# Patient Record
Sex: Male | Born: 1955
Health system: Southern US, Community
[De-identification: ages and names within clinical notes are randomized; demographics above are authoritative.]

## PROBLEM LIST (undated history)

## (undated) DIAGNOSIS — E785 Hyperlipidemia, unspecified: Secondary | ICD-10-CM

## (undated) DIAGNOSIS — E119 Type 2 diabetes mellitus without complications: Secondary | ICD-10-CM

## (undated) DIAGNOSIS — I10 Essential (primary) hypertension: Secondary | ICD-10-CM

## (undated) DIAGNOSIS — T7840XA Allergy, unspecified, initial encounter: Secondary | ICD-10-CM

## (undated) HISTORY — PX: TONSILLECTOMY AND ADENOIDECTOMY: SHX28

## (undated) HISTORY — PX: VASECTOMY: SHX75

## (undated) HISTORY — DX: Hyperlipidemia, unspecified: E78.5

## (undated) HISTORY — PX: TESTICLE SURGERY: SHX794

## (undated) HISTORY — PX: HERNIA REPAIR: SHX51

## (undated) HISTORY — DX: Allergy, unspecified, initial encounter: T78.40XA

## (undated) HISTORY — DX: Essential (primary) hypertension: I10

## (undated) HISTORY — DX: Type 2 diabetes mellitus without complications: E11.9

---

## 2001-05-18 ENCOUNTER — Encounter: Payer: Self-pay | Admitting: Family Medicine

## 2001-05-18 ENCOUNTER — Ambulatory Visit (HOSPITAL_COMMUNITY): Admission: RE | Admit: 2001-05-18 | Discharge: 2001-05-18 | Payer: Self-pay | Admitting: Family Medicine

## 2012-06-04 ENCOUNTER — Other Ambulatory Visit: Payer: Self-pay | Admitting: *Deleted

## 2012-06-04 MED ORDER — CANAGLIFLOZIN 300 MG PO TABS: 300.0000 mg | ORAL_TABLET | Freq: Every day | ORAL | Status: DC

## 2012-06-04 NOTE — Telephone Encounter (Signed)
Patient last seen on 09-07-11 for chronic health check. Last A1C on 12-21-11. Please advise. Thank you

## 2012-09-08 ENCOUNTER — Other Ambulatory Visit: Payer: Self-pay | Admitting: *Deleted

## 2012-09-08 MED ORDER — CANAGLIFLOZIN 300 MG PO TABS: 300.0000 mg | ORAL_TABLET | Freq: Every day | ORAL | Status: DC

## 2012-09-25 ENCOUNTER — Encounter: Payer: Self-pay | Admitting: *Deleted

## 2012-10-01 ENCOUNTER — Ambulatory Visit: Payer: Self-pay | Admitting: General Practice

## 2012-10-03 ENCOUNTER — Ambulatory Visit: Payer: Self-pay | Admitting: General Practice

## 2012-10-06 ENCOUNTER — Other Ambulatory Visit: Payer: Self-pay | Admitting: Family Medicine

## 2012-10-07 ENCOUNTER — Telehealth: Payer: Self-pay | Admitting: Family Medicine

## 2012-10-07 MED ORDER — SITAGLIPTIN PHOS-METFORMIN HCL 50-1000 MG PO TABS: 1.0000 | ORAL_TABLET | Freq: Two times a day (BID) | ORAL | Status: DC

## 2012-10-17 ENCOUNTER — Encounter: Payer: Self-pay | Admitting: General Practice

## 2012-10-17 ENCOUNTER — Telehealth: Payer: Self-pay | Admitting: General Practice

## 2012-10-17 ENCOUNTER — Ambulatory Visit (INDEPENDENT_AMBULATORY_CARE_PROVIDER_SITE_OTHER): Payer: BC Managed Care – PPO | Admitting: General Practice

## 2012-10-17 VITALS — BP 135/79 | HR 69 | Temp 97.9°F | Ht 72.0 in | Wt 225.5 lb

## 2012-10-17 DIAGNOSIS — I10 Essential (primary) hypertension: Secondary | ICD-10-CM

## 2012-10-17 DIAGNOSIS — E785 Hyperlipidemia, unspecified: Secondary | ICD-10-CM

## 2012-10-17 DIAGNOSIS — E119 Type 2 diabetes mellitus without complications: Secondary | ICD-10-CM

## 2012-10-17 LAB — POCT CBC
HCT, POC: 47.7 % (ref 43.5–53.7)
Lymph, poc: 2 (ref 0.6–3.4)
MCHC: 32.4 g/dL (ref 31.8–35.4)
MCV: 79.1 fL — AB (ref 80–97)
POC LYMPH PERCENT: 23.2 %L (ref 10–50)
RDW, POC: 15.4 %

## 2012-10-17 MED ORDER — HYDROCHLOROTHIAZIDE 12.5 MG PO CAPS: 12.5000 mg | ORAL_CAPSULE | Freq: Every day | ORAL | Status: DC

## 2012-10-17 MED ORDER — SIMVASTATIN 10 MG PO TABS: 10.0000 mg | ORAL_TABLET | Freq: Every day | ORAL | Status: DC

## 2012-10-17 MED ORDER — SITAGLIPTIN PHOS-METFORMIN HCL 50-1000 MG PO TABS: 1.0000 | ORAL_TABLET | Freq: Two times a day (BID) | ORAL | Status: DC

## 2012-10-17 MED ORDER — CANAGLIFLOZIN 300 MG PO TABS: 1.0000 | ORAL_TABLET | Freq: Every day | ORAL | Status: DC

## 2012-10-17 MED ORDER — RAMIPRIL 10 MG PO CAPS: 10.0000 mg | ORAL_CAPSULE | Freq: Every day | ORAL | Status: DC

## 2012-10-17 NOTE — Patient Instructions (Signed)

## 2012-10-17 NOTE — Progress Notes (Signed)
  Subjective:    Patient ID: Jason Rogers, male    DOB: November 09, 1955, 57 y.o.   MRN: [id number]  HPI Patient presents today for 3 month follow up. He has history of hypertension, diabetes, and hyperlipidemia. Denies checking blood sugars at home. He denies regular exercise. Reports trying to eat healthy.     Review of Systems  Constitutional: Negative for fever and chills.  HENT: Negative for neck pain and neck stiffness.   Respiratory: Negative for chest tightness and shortness of breath.   Cardiovascular: Negative for chest pain and palpitations.  Gastrointestinal: Negative for vomiting, abdominal pain, diarrhea and blood in stool.  Genitourinary: Negative for dysuria and difficulty urinating.  Neurological: Negative for dizziness, weakness and headaches.       Objective:   Physical Exam  Constitutional: He is oriented to person, place, and time. He appears well-developed and well-nourished.  HENT:  Head: Normocephalic and atraumatic.  Right Ear: External ear normal.  Left Ear: External ear normal.  Mouth/Throat: Oropharynx is clear and moist.  Eyes: Conjunctivae and EOM are normal. Pupils are equal, round, and reactive to light.  Neck: Normal range of motion. Neck supple.  Cardiovascular: Normal rate, regular rhythm and normal heart sounds.   Pulmonary/Chest: Effort normal and breath sounds normal. No respiratory distress. He exhibits no tenderness.  Abdominal: Soft. Bowel sounds are normal. He exhibits no distension. There is no tenderness.  Musculoskeletal: He exhibits no edema and no tenderness.  Neurological: He is alert and oriented to person, place, and time.  Skin: Skin is warm and dry.  Psychiatric: He has a normal mood and affect.          Assessment & Plan:  1. Diabetes - POCT glycosylated hemoglobin (Hb A1C) - Canagliflozin (INVOKANA) 300 MG TABS; Take 1 tablet (300 mg total) by mouth daily.  Dispense: 90 tablet; Refill: 1 - sitaGLIPtan-metformin (JANUMET)  50-1000 MG per tablet; Take 1 tablet by mouth 2 (two) times daily with a meal.  Dispense: 60 tablet; Refill: 3  2. Hyperlipemia - simvastatin (ZOCOR) 10 MG tablet; Take 1 tablet (10 mg total) by mouth at bedtime.  Dispense: 90 tablet; Refill: 1 - NMR, lipoprofile  3. Hypertension - hydrochlorothiazide (MICROZIDE) 12.5 MG capsule; Take 1 capsule (12.5 mg total) by mouth daily.  Dispense: 90 capsule; Refill: 1 - ramipril (ALTACE) 10 MG capsule; Take 1 capsule (10 mg total) by mouth daily.  Dispense: 90 capsule; Refill: 1 - POCT CBC - CMP14+EGFR -Continue all current medications Labs pending F/u in 3 months and sooner if symptoms develop Discussed exercise and diet  Patient verbalized understanding Coralie Keens, FNP-C

## 2012-10-19 LAB — CMP14+EGFR
ALT: 24 IU/L (ref 0–44)
AST: 20 IU/L (ref 0–40)
Albumin/Globulin Ratio: 2 (ref 1.1–2.5)
Alkaline Phosphatase: 47 IU/L (ref 39–117)
BUN/Creatinine Ratio: 23 — ABNORMAL HIGH (ref 9–20)
GFR calc Af Amer: 89 mL/min/{1.73_m2} (ref >59)
GFR calc non Af Amer: 77 mL/min/{1.73_m2} (ref >59)
Potassium: 4.6 mmol/L (ref 3.5–5.2)
Sodium: 139 mmol/L (ref 134–144)
Total Bilirubin: 1.3 mg/dL — ABNORMAL HIGH (ref 0.0–1.2)

## 2012-10-19 LAB — NMR, LIPOPROFILE
Cholesterol: 157 mg/dL (ref <200)
HDL Particle Number: 37 umol/L (ref >=30.5)
LP-IR Score: 81 — ABNORMAL HIGH (ref <=45)
Small LDL Particle Number: 746 nmol/L — ABNORMAL HIGH (ref <=527)

## 2012-10-24 ENCOUNTER — Telehealth: Payer: Self-pay | Admitting: General Practice

## 2012-10-24 NOTE — Telephone Encounter (Signed)
Notified patient of lab results. See last lab note

## 2012-10-24 NOTE — Telephone Encounter (Signed)
Duplicate message. 

## 2013-03-16 ENCOUNTER — Other Ambulatory Visit: Payer: Self-pay | Admitting: *Deleted

## 2013-03-16 DIAGNOSIS — E119 Type 2 diabetes mellitus without complications: Secondary | ICD-10-CM

## 2013-03-16 MED ORDER — SITAGLIPTIN PHOS-METFORMIN HCL 50-1000 MG PO TABS: 1.0000 | ORAL_TABLET | Freq: Two times a day (BID) | ORAL | Status: DC

## 2013-04-10 ENCOUNTER — Encounter: Payer: Self-pay | Admitting: General Practice

## 2013-04-10 ENCOUNTER — Ambulatory Visit (INDEPENDENT_AMBULATORY_CARE_PROVIDER_SITE_OTHER): Payer: BC Managed Care – PPO | Admitting: General Practice

## 2013-04-10 VITALS — BP 126/76 | HR 68 | Temp 97.5°F | Ht 72.0 in | Wt 231.0 lb

## 2013-04-10 DIAGNOSIS — E1169 Type 2 diabetes mellitus with other specified complication: Secondary | ICD-10-CM | POA: Insufficient documentation

## 2013-04-10 DIAGNOSIS — I10 Essential (primary) hypertension: Secondary | ICD-10-CM

## 2013-04-10 DIAGNOSIS — E785 Hyperlipidemia, unspecified: Secondary | ICD-10-CM

## 2013-04-10 DIAGNOSIS — E119 Type 2 diabetes mellitus without complications: Secondary | ICD-10-CM | POA: Insufficient documentation

## 2013-04-10 DIAGNOSIS — I152 Hypertension secondary to endocrine disorders: Secondary | ICD-10-CM | POA: Insufficient documentation

## 2013-04-10 DIAGNOSIS — E1159 Type 2 diabetes mellitus with other circulatory complications: Secondary | ICD-10-CM | POA: Insufficient documentation

## 2013-04-10 LAB — POCT GLYCOSYLATED HEMOGLOBIN (HGB A1C): Hemoglobin A1C: 7.2

## 2013-04-10 MED ORDER — HYDROCHLOROTHIAZIDE 12.5 MG PO CAPS: 12.5000 mg | ORAL_CAPSULE | Freq: Every day | ORAL | Status: DC

## 2013-04-10 MED ORDER — SIMVASTATIN 10 MG PO TABS: 10.0000 mg | ORAL_TABLET | Freq: Every day | ORAL | Status: DC

## 2013-04-10 MED ORDER — SITAGLIPTIN PHOS-METFORMIN HCL 50-1000 MG PO TABS: 1.0000 | ORAL_TABLET | Freq: Two times a day (BID) | ORAL | Status: DC

## 2013-04-10 MED ORDER — CANAGLIFLOZIN 300 MG PO TABS: 1.0000 | ORAL_TABLET | Freq: Every day | ORAL | Status: DC

## 2013-04-10 MED ORDER — RAMIPRIL 10 MG PO CAPS: 10.0000 mg | ORAL_CAPSULE | Freq: Every day | ORAL | Status: DC

## 2013-04-10 NOTE — Patient Instructions (Signed)
Leg Cramps  Leg cramps that occur during exercise can be caused by poor circulation or dehydration. However, muscle cramps that occur at rest or during the night are usually not due to any serious medical problem. Heat cramps may cause muscle spasms during hot weather.   CAUSES  There is no clear cause for muscle cramps. However, dehydration may be a factor for those who do not drink enough fluids and those who exercise in the heat. Imbalances in the level of sodium, potassium, calcium or magnesium in the muscle tissue may also be a factor. Some medications, such as water pills (diuretics), may cause loss of chemicals that the body needs (like sodium and potassium) and cause muscle cramps.  TREATMENT   · Make sure your diet has enough fluids and essential minerals for the muscle to work normally.  · Avoid strenuous exercise for several days if you have been having frequent leg cramps.  · Stretch and massage the cramped muscle for several minutes.  · Some medicines may be helpful in some patients with night cramps. Only take over-the-counter or prescription medicines as directed by your caregiver.  SEEK IMMEDIATE MEDICAL CARE IF:   · Your leg cramps become worse.  · Your foot becomes cold, numb, or blue.  Document Released: 03/22/2004 Document Revised: 05/07/2011 Document Reviewed: 03/09/2008  [employer]® Patient Information ©2014 [employer].

## 2013-04-10 NOTE — Progress Notes (Signed)
   Subjective:    Patient ID: Jason Rogers, male    DOB: 12/12/1955, 58 y.o.   MRN: [id number]  HPI Patient presents today for chronic health follow up. History of diabetes, hyperlipidemia, and hypertension. He reports taking medications as prescribed, but denies checking blood sugars or pressures. Reports trying to eat a healthy diet. Complaints of leg cramps periodically, but is relieved with eating foods higher in potassium. Denies regular exercise.     Review of Systems  Constitutional: Negative for fever and chills.  Respiratory: Negative for chest tightness and shortness of breath.   Cardiovascular: Negative for chest pain and palpitations.  All other systems reviewed and are negative.       Objective:   Physical Exam  Constitutional: He is oriented to person, place, and time. He appears well-developed and well-nourished.  HENT:  Head: Normocephalic and atraumatic.  Right Ear: External ear normal.  Left Ear: External ear normal.  Cardiovascular: Normal rate, regular rhythm and normal heart sounds.   Pulmonary/Chest: Effort normal and breath sounds normal. No respiratory distress. He exhibits no tenderness.  Abdominal: Soft. Bowel sounds are normal. He exhibits no distension. There is no tenderness.  Neurological: He is alert and oriented to person, place, and time.  Skin: Skin is warm and dry.  Psychiatric: He has a normal mood and affect.          Assessment & Plan:  1. Diabetes  - sitaGLIPtin-metformin (JANUMET) 50-1000 MG per tablet; Take 1 tablet by mouth 2 (two) times daily with a meal.  Dispense: 180 tablet; Refill: 1 - Canagliflozin (INVOKANA) 300 MG TABS; Take 1 tablet (300 mg total) by mouth daily.  Dispense: 90 tablet; Refill: 1 -HgbA1C  2. Hyperlipemia  - simvastatin (ZOCOR) 10 MG tablet; Take 1 tablet (10 mg total) by mouth at bedtime.  Dispense: 90 tablet; Refill: 1 -lipid panel  3. Hypertension  - ramipril (ALTACE) 10 MG capsule; Take 1 capsule  (10 mg total) by mouth daily.  Dispense: 90 capsule; Refill: 1 - hydrochlorothiazide (MICROZIDE) 12.5 MG capsule; Take 1 capsule (12.5 mg total) by mouth daily.  Dispense: 90 capsule; Refill: 1 - CMP14+EGFR Continue all current medications Labs pending F/u in 3 months Discussed benefits of regular exercise and healthy eating Patient verbalized understanding Erby Pian, FNP-C

## 2013-04-11 LAB — CMP14+EGFR
ALBUMIN: 4.5 g/dL (ref 3.5–5.5)
ALT: 23 IU/L (ref 0–44)
AST: 22 IU/L (ref 0–40)
Albumin/Globulin Ratio: 2.3 (ref 1.1–2.5)
Alkaline Phosphatase: 48 IU/L (ref 39–117)
BILIRUBIN TOTAL: 0.9 mg/dL (ref 0.0–1.2)
BUN/Creatinine Ratio: 21 — ABNORMAL HIGH (ref 9–20)
BUN: 23 mg/dL (ref 6–24)
CHLORIDE: 100 mmol/L (ref 97–108)
CO2: 24 mmol/L (ref 18–29)
CREATININE: 1.07 mg/dL (ref 0.76–1.27)
Calcium: 9.2 mg/dL (ref 8.7–10.2)
GFR, EST AFRICAN AMERICAN: 87 mL/min/{1.73_m2} (ref >59)
GFR, EST NON AFRICAN AMERICAN: 76 mL/min/{1.73_m2} (ref >59)
GLOBULIN, TOTAL: 2 g/dL (ref 1.5–4.5)
GLUCOSE: 135 mg/dL — AB (ref 65–99)
Potassium: 4.7 mmol/L (ref 3.5–5.2)
Sodium: 140 mmol/L (ref 134–144)
TOTAL PROTEIN: 6.5 g/dL (ref 6.0–8.5)

## 2013-04-11 LAB — LIPID PANEL
CHOLESTEROL TOTAL: 152 mg/dL (ref 100–199)
Chol/HDL Ratio: 3.4 ratio units (ref 0.0–5.0)
HDL: 45 mg/dL (ref >39)
LDL CALC: 76 mg/dL (ref 0–99)
Triglycerides: 154 mg/dL — ABNORMAL HIGH (ref 0–149)
VLDL CHOLESTEROL CAL: 31 mg/dL (ref 5–40)

## 2013-04-18 ENCOUNTER — Other Ambulatory Visit: Payer: Self-pay | Admitting: General Practice

## 2013-04-20 ENCOUNTER — Telehealth: Payer: Self-pay | Admitting: Family Medicine

## 2013-04-20 NOTE — Telephone Encounter (Signed)
Pt wants copy of lab results mailed to him.  Pt aware of results.  rs

## 2013-04-20 NOTE — Telephone Encounter (Signed)
Message copied by Waverly Ferrari on Mon Apr 20, 2013 10:01 AM ------      Message from: Erby Pian      Created: Sat Apr 18, 2013 11:52 AM       Please inform that labs are within range. HgbA1c is 7.2 and triglycerides are elevated. Continue medications as directed. Also continue working on healthy eating and some form of regular exercise. Will recheck in 3 months. ------

## 2013-05-19 ENCOUNTER — Encounter: Payer: Self-pay | Admitting: *Deleted

## 2013-06-03 ENCOUNTER — Ambulatory Visit: Payer: BC Managed Care – PPO | Admitting: General Practice

## 2013-06-04 ENCOUNTER — Encounter: Payer: Self-pay | Admitting: *Deleted

## 2013-06-05 ENCOUNTER — Ambulatory Visit: Payer: BC Managed Care – PPO | Admitting: General Practice

## 2013-06-24 ENCOUNTER — Ambulatory Visit: Payer: BC Managed Care – PPO | Admitting: General Practice

## 2013-07-03 ENCOUNTER — Ambulatory Visit: Payer: BC Managed Care – PPO | Admitting: General Practice

## 2013-09-15 ENCOUNTER — Encounter: Payer: Self-pay | Admitting: *Deleted

## 2013-10-03 ENCOUNTER — Other Ambulatory Visit: Payer: Self-pay | Admitting: General Practice

## 2013-10-05 NOTE — Telephone Encounter (Signed)
Last seen and last glucose 04/10/13 Mae  This med not on [facility] med list

## 2013-10-05 NOTE — Telephone Encounter (Signed)
no more refills without being seen  

## 2013-10-23 ENCOUNTER — Encounter: Payer: Self-pay | Admitting: Family

## 2013-10-23 ENCOUNTER — Ambulatory Visit (INDEPENDENT_AMBULATORY_CARE_PROVIDER_SITE_OTHER): Payer: BC Managed Care – PPO | Admitting: Family

## 2013-10-23 VITALS — BP 138/81 | HR 64 | Temp 97.1°F | Ht 72.0 in | Wt 235.0 lb

## 2013-10-23 DIAGNOSIS — I10 Essential (primary) hypertension: Secondary | ICD-10-CM

## 2013-10-23 DIAGNOSIS — Z1321 Encounter for screening for nutritional disorder: Secondary | ICD-10-CM

## 2013-10-23 DIAGNOSIS — Z125 Encounter for screening for malignant neoplasm of prostate: Secondary | ICD-10-CM

## 2013-10-23 DIAGNOSIS — E119 Type 2 diabetes mellitus without complications: Secondary | ICD-10-CM

## 2013-10-23 DIAGNOSIS — R079 Chest pain, unspecified: Secondary | ICD-10-CM

## 2013-10-23 DIAGNOSIS — E785 Hyperlipidemia, unspecified: Secondary | ICD-10-CM

## 2013-10-23 LAB — POCT UA - MICROALBUMIN: Microalbumin Ur, POC: NEGATIVE mg/L

## 2013-10-23 LAB — POCT GLYCOSYLATED HEMOGLOBIN (HGB A1C): Hemoglobin A1C: 6.3

## 2013-10-23 MED ORDER — SIMVASTATIN 10 MG PO TABS: 10.0000 mg | ORAL_TABLET | Freq: Every day | ORAL | Status: DC

## 2013-10-23 MED ORDER — CANAGLIFLOZIN 300 MG PO TABS: 1.0000 | ORAL_TABLET | Freq: Every day | ORAL | Status: DC

## 2013-10-23 MED ORDER — SITAGLIPTIN PHOS-METFORMIN HCL 50-1000 MG PO TABS
ORAL_TABLET | ORAL | Status: DC
Start: 2013-10-23 — End: 2014-04-30

## 2013-10-23 MED ORDER — HYDROCHLOROTHIAZIDE 12.5 MG PO CAPS: 12.5000 mg | ORAL_CAPSULE | Freq: Every day | ORAL | Status: DC

## 2013-10-23 MED ORDER — RAMIPRIL 10 MG PO CAPS: 10.0000 mg | ORAL_CAPSULE | Freq: Every day | ORAL | Status: DC

## 2013-10-23 NOTE — Patient Instructions (Signed)

## 2013-10-23 NOTE — Progress Notes (Signed)
 Subjective:    Patient ID: Jason Rogers, male    DOB: 10/18/1955, 58 y.o.   MRN: [id number]  Diabetes He presents for his follow-up diabetic visit. He has type 2 diabetes mellitus. His disease course has been stable. Hypoglycemia symptoms include headaches. Pertinent negatives for hypoglycemia include no confusion or dizziness. Pertinent negatives for diabetes include no blurred vision, no foot paresthesias, no foot ulcerations and no visual change. Pertinent negatives for hypoglycemia complications include no blackouts. Symptoms are stable. Pertinent negatives for diabetic complications include no CVA, heart disease, nephropathy or peripheral neuropathy. Risk factors for coronary artery disease include diabetes mellitus, dyslipidemia, hypertension, male sex and family history. Current diabetic treatment includes oral agent (triple therapy). He is compliant with treatment all of the time. He is following a generally healthy diet. An ACE inhibitor/angiotensin II receptor blocker is being taken. Eye exam is not current.  Hypertension This is a chronic problem. The current episode started more than 1 year ago. The problem has been resolved since onset. The problem is controlled. Associated symptoms include headaches. Pertinent negatives include no blurred vision, palpitations, peripheral edema or shortness of breath. Risk factors for coronary artery disease include diabetes mellitus, dyslipidemia, family history and male gender. Past treatments include ACE inhibitors and diuretics. The current treatment provides moderate improvement. There is no history of kidney disease, CAD/MI, CVA, heart failure or a thyroid problem.  Hyperlipidemia This is a chronic problem. The current episode started more than 1 year ago. The problem is controlled. Recent lipid tests were reviewed and are normal. Exacerbating diseases include diabetes. He has no history of hypothyroidism. Pertinent negatives include no leg pain,  myalgias or shortness of breath. Current antihyperlipidemic treatment includes statins. The current treatment provides moderate improvement of lipids. Risk factors for coronary artery disease include diabetes mellitus, dyslipidemia, family history, hypertension, male sex and a sedentary lifestyle.   *Pt states he is having intermittent chest pain that comes and goes. Pt states this has been going on for years. He played footfall in school and states he took a lot of hard hits in his chest. Pt states that usually the pain is after lifting something heavy. However, he was on vacation this week and states he "did nothing" but felt like he was having chest pain more than usual.     Review of Systems  Constitutional: Negative.   Eyes: Negative for blurred vision.  Respiratory: Negative.  Negative for shortness of breath.   Cardiovascular: Negative.  Negative for palpitations.  Gastrointestinal: Negative.   Endocrine: Negative.   Genitourinary: Negative.   Musculoskeletal: Negative.  Negative for myalgias.  Neurological: Positive for headaches. Negative for dizziness.  Hematological: Negative.   Psychiatric/Behavioral: Negative.  Negative for confusion.  All other systems reviewed and are negative.      Objective:   Physical Exam  Vitals reviewed. Constitutional: He is oriented to person, place, and time. He appears well-developed and well-nourished. No distress.  HENT:  Head: Normocephalic.  Right Ear: External ear normal.  Left Ear: External ear normal.  Nose: Nose normal.  Mouth/Throat: Oropharynx is clear and moist.  Eyes: Pupils are equal, round, and reactive to light. Right eye exhibits no discharge. Left eye exhibits no discharge.  Neck: Normal range of motion. Neck supple. No thyromegaly present.  Cardiovascular: Normal rate, regular rhythm, normal heart sounds and intact distal pulses.   No murmur heard. Pulmonary/Chest: Effort normal and breath sounds normal. No respiratory  distress. He has no wheezes.  Abdominal: Soft.   Bowel sounds are normal. He exhibits no distension. There is no tenderness.  Musculoskeletal: Normal range of motion. He exhibits no edema and no tenderness.  Neurological: He is alert and oriented to person, place, and time. He has normal reflexes. No cranial nerve deficit.  Skin: Skin is warm and dry. No rash noted. No erythema.  Psychiatric: He has a normal mood and affect. His behavior is normal. Judgment and thought content normal.   *See Diabetic foot note  EGK-WNL  BP 138/81  Pulse 64  Temp(Src) 97.1 F (36.2 C) (Oral)  Ht 6' (1.829 m)  Wt 235 lb (106.595 kg)  BMI 31.86 kg/m2     Assessment & Plan:  1. Essential hypertension - CMP14+EGFR - hydrochlorothiazide (MICROZIDE) 12.5 MG capsule; Take 1 capsule (12.5 mg total) by mouth daily.  Dispense: 90 capsule; Refill: 1 - ramipril (ALTACE) 10 MG capsule; Take 1 capsule (10 mg total) by mouth daily.  Dispense: 90 capsule; Refill: 1  2. Type 2 diabetes mellitus without complication - POCT glycosylated hemoglobin (Hb A1C) - CMP14+EGFR - POCT UA - Microalbumin - Canagliflozin (INVOKANA) 300 MG TABS; Take 1 tablet (300 mg total) by mouth daily.  Dispense: 90 tablet; Refill: 2  3. HLD (hyperlipidemia) - CMP14+EGFR - Lipid panel - simvastatin (ZOCOR) 10 MG tablet; Take 1 tablet (10 mg total) by mouth at bedtime.  Dispense: 90 tablet; Refill: 1  4. Hyperlipemia  5. Encounter for vitamin deficiency screening - Vit D  25 hydroxy (rtn osteoporosis monitoring)  6. Prostate cancer screening - PSA, total and free   Continue all meds Labs pending Health Maintenance reviewed-hemoccult cards given to patient with directions Diet and exercise encouraged RTO 6 months  Christy Hawks, FNP   

## 2013-10-24 LAB — CMP14+EGFR
A/G RATIO: 1.9 (ref 1.1–2.5)
ALT: 26 IU/L (ref 0–44)
AST: 24 IU/L (ref 0–40)
Albumin: 4.4 g/dL (ref 3.5–5.5)
Alkaline Phosphatase: 51 IU/L (ref 39–117)
BILIRUBIN TOTAL: 0.7 mg/dL (ref 0.0–1.2)
BUN/Creatinine Ratio: 21 — ABNORMAL HIGH (ref 9–20)
BUN: 22 mg/dL (ref 6–24)
CO2: 24 mmol/L (ref 18–29)
Calcium: 9.3 mg/dL (ref 8.7–10.2)
Chloride: 100 mmol/L (ref 97–108)
Creatinine, Ser: 1.05 mg/dL (ref 0.76–1.27)
GFR, EST AFRICAN AMERICAN: 89 mL/min/{1.73_m2} (ref >59)
GFR, EST NON AFRICAN AMERICAN: 77 mL/min/{1.73_m2} (ref >59)
GLOBULIN, TOTAL: 2.3 g/dL (ref 1.5–4.5)
Glucose: 137 mg/dL — ABNORMAL HIGH (ref 65–99)
Potassium: 4.7 mmol/L (ref 3.5–5.2)
SODIUM: 139 mmol/L (ref 134–144)
TOTAL PROTEIN: 6.7 g/dL (ref 6.0–8.5)

## 2013-10-24 LAB — LIPID PANEL
CHOL/HDL RATIO: 3.3 ratio (ref 0.0–5.0)
Cholesterol, Total: 152 mg/dL (ref 100–199)
HDL: 46 mg/dL (ref >39)
LDL Calculated: 79 mg/dL (ref 0–99)
Triglycerides: 134 mg/dL (ref 0–149)
VLDL Cholesterol Cal: 27 mg/dL (ref 5–40)

## 2013-10-24 LAB — PSA, TOTAL AND FREE
PSA, Free Pct: 18.1 %
PSA, Free: 0.38 ng/mL
PSA: 2.1 ng/mL (ref 0.0–4.0)

## 2013-10-24 LAB — VITAMIN D 25 HYDROXY (VIT D DEFICIENCY, FRACTURES): Vit D, 25-Hydroxy: 32.4 ng/mL (ref 30.0–100.0)

## 2013-10-26 ENCOUNTER — Telehealth: Payer: Self-pay | Admitting: Family Medicine

## 2013-10-26 NOTE — Telephone Encounter (Signed)
Lab results given to wife and would like copy mailed to them.  Wife wants to know if lab her lab results were back and also wants her lab results mailed to her..  Call her at [phone]-home

## 2013-10-26 NOTE — Telephone Encounter (Signed)
Message copied by Waverly Ferrari on Mon Oct 26, 2013 10:51 AM ------      Message from: Lenna Gilford, [place] A      Created: Mon Oct 26, 2013 10:41 AM       HgbA1C- WNL      Microablumin negative      Kidney and liver function stable      Cholesterol levels WNL      PSA levels WNL      Vit D levels low side of normal- Would benefit from Vit D OTC ------

## 2013-10-26 NOTE — Telephone Encounter (Signed)
Copy mailed.

## 2014-03-05 ENCOUNTER — Other Ambulatory Visit: Payer: Self-pay | Admitting: Family

## 2014-03-09 ENCOUNTER — Encounter: Payer: Self-pay | Admitting: *Deleted

## 2014-04-30 ENCOUNTER — Ambulatory Visit (INDEPENDENT_AMBULATORY_CARE_PROVIDER_SITE_OTHER): Payer: BLUE CROSS/BLUE SHIELD | Admitting: Family

## 2014-04-30 ENCOUNTER — Encounter: Payer: Self-pay | Admitting: Family

## 2014-04-30 VITALS — BP 128/81 | HR 69 | Temp 97.1°F | Ht 72.0 in | Wt 234.0 lb

## 2014-04-30 DIAGNOSIS — E785 Hyperlipidemia, unspecified: Secondary | ICD-10-CM

## 2014-04-30 DIAGNOSIS — I10 Essential (primary) hypertension: Secondary | ICD-10-CM

## 2014-04-30 DIAGNOSIS — E119 Type 2 diabetes mellitus without complications: Secondary | ICD-10-CM | POA: Diagnosis not present

## 2014-04-30 DIAGNOSIS — E559 Vitamin D deficiency, unspecified: Secondary | ICD-10-CM | POA: Diagnosis not present

## 2014-04-30 DIAGNOSIS — Z23 Encounter for immunization: Secondary | ICD-10-CM | POA: Diagnosis not present

## 2014-04-30 LAB — POCT GLYCOSYLATED HEMOGLOBIN (HGB A1C): HEMOGLOBIN A1C: 7.2

## 2014-04-30 MED ORDER — HYDROCHLOROTHIAZIDE 12.5 MG PO CAPS: 12.5000 mg | ORAL_CAPSULE | Freq: Every day | ORAL | Status: DC

## 2014-04-30 MED ORDER — RAMIPRIL 10 MG PO CAPS: 10.0000 mg | ORAL_CAPSULE | Freq: Every day | ORAL | Status: DC

## 2014-04-30 MED ORDER — CANAGLIFLOZIN 300 MG PO TABS: 300.0000 mg | ORAL_TABLET | Freq: Every day | ORAL | Status: DC

## 2014-04-30 MED ORDER — SIMVASTATIN 10 MG PO TABS: 10.0000 mg | ORAL_TABLET | Freq: Every day | ORAL | Status: DC

## 2014-04-30 MED ORDER — SITAGLIPTIN PHOS-METFORMIN HCL 50-1000 MG PO TABS: ORAL_TABLET | ORAL | Status: DC

## 2014-04-30 NOTE — Addendum Note (Signed)
Addended by: Rolena Infante on: 04/30/2014 12:14 PM   Modules accepted: Orders

## 2014-04-30 NOTE — Patient Instructions (Signed)

## 2014-04-30 NOTE — Progress Notes (Signed)
Subjective:    Patient ID: Jason Rogers, male    DOB: 1955-08-08, 59 y.o.   MRN: [id number]  Diabetes He presents for his follow-up diabetic visit. He has type 2 diabetes mellitus. His disease course has been stable. Hypoglycemia symptoms include headaches. Pertinent negatives for hypoglycemia include no confusion. Pertinent negatives for diabetes include no blurred vision, no foot paresthesias, no foot ulcerations and no visual change. Pertinent negatives for hypoglycemia complications include no blackouts and no hospitalization. Symptoms are stable. Pertinent negatives for diabetic complications include no CVA, heart disease, nephropathy or peripheral neuropathy. Risk factors for coronary artery disease include diabetes mellitus, dyslipidemia, family history, hypertension and male sex. Current diabetic treatment includes diet and oral agent (dual therapy). He is compliant with treatment all of the time. His weight is stable. He is following a generally healthy diet. (Pt does not take blood glucose ) An ACE inhibitor/angiotensin II receptor blocker is being taken. Eye exam is not current.  Hypertension This is a chronic problem. The current episode started more than 1 year ago. The problem has been resolved since onset. The problem is controlled. Associated symptoms include headaches. Pertinent negatives include no blurred vision, palpitations, peripheral edema or shortness of breath. Risk factors for coronary artery disease include diabetes mellitus, dyslipidemia, family history, male gender and sedentary lifestyle. Past treatments include ACE inhibitors and diuretics. There is no history of kidney disease, CAD/MI, CVA, heart failure or a thyroid problem. There is no history of sleep apnea.  Hyperlipidemia This is a chronic problem. The current episode started more than 1 year ago. The problem is controlled. Recent lipid tests were reviewed and are normal. Exacerbating diseases include diabetes. He  has no history of hypothyroidism. Pertinent negatives include no leg pain, myalgias or shortness of breath. Current antihyperlipidemic treatment includes statins. The current treatment provides significant improvement of lipids. Risk factors for coronary artery disease include diabetes mellitus, dyslipidemia, family history, hypertension, male sex, post-menopausal and a sedentary lifestyle.      Review of Systems  Constitutional: Negative.   Eyes: Negative for blurred vision.  Respiratory: Negative.  Negative for shortness of breath.   Cardiovascular: Negative.  Negative for palpitations.  Gastrointestinal: Negative.   Endocrine: Negative.   Genitourinary: Negative.   Musculoskeletal: Negative.  Negative for myalgias.  Neurological: Positive for headaches.  Hematological: Negative.   Psychiatric/Behavioral: Negative.  Negative for confusion.  All other systems reviewed and are negative.      Objective:   Physical Exam  Constitutional: He is oriented to person, place, and time. He appears well-developed and well-nourished. No distress.  HENT:  Head: Normocephalic.  Right Ear: External ear normal.  Left Ear: External ear normal.  Nose: Nose normal.  Mouth/Throat: Oropharynx is clear and moist.  Eyes: Pupils are equal, round, and reactive to light. Right eye exhibits no discharge. Left eye exhibits no discharge.  Neck: Normal range of motion. Neck supple. No thyromegaly present.  Cardiovascular: Normal rate, regular rhythm, normal heart sounds and intact distal pulses.   No murmur heard. Pulmonary/Chest: Effort normal and breath sounds normal. No respiratory distress. He has no wheezes.  Abdominal: Soft. Bowel sounds are normal. He exhibits no distension. There is no tenderness.  Musculoskeletal: Normal range of motion. He exhibits no edema or tenderness.  Neurological: He is alert and oriented to person, place, and time. He has normal reflexes. No cranial nerve deficit.  Skin:  Skin is warm and dry. No rash noted. No erythema.  Psychiatric: He has  a normal mood and affect. His behavior is normal. Judgment and thought content normal.  Vitals reviewed.     BP 128/81 mmHg  Pulse 69  Temp(Src) 97.1 F (36.2 C) (Oral)  Ht 6' (1.829 m)  Wt 234 lb (106.142 kg)  BMI 31.73 kg/m2     Assessment & Plan:  1. Essential hypertension - CMP14+EGFR - ramipril (ALTACE) 10 MG capsule; Take 1 capsule (10 mg total) by mouth daily.  Dispense: 90 capsule; Refill: 4 - hydrochlorothiazide (MICROZIDE) 12.5 MG capsule; Take 1 capsule (12.5 mg total) by mouth daily.  Dispense: 90 capsule; Refill: 3  2. HLD (hyperlipidemia) - CMP14+EGFR - Lipid panel - simvastatin (ZOCOR) 10 MG tablet; Take 1 tablet (10 mg total) by mouth at bedtime.  Dispense: 90 tablet; Refill: 4  3. Type 2 diabetes mellitus without complication - POCT glycosylated hemoglobin (Hb A1C) - CMP14+EGFR - sitaGLIPtin-metformin (JANUMET) 50-1000 MG per tablet; TAKE ONE TABLET BY MOUTH TWICE DAILY WITH A MEAL  Dispense: 180 tablet; Refill: 4 - canagliflozin (INVOKANA) 300 MG TABS tablet; Take 300 mg by mouth daily.  Dispense: 90 tablet; Refill: 4  4. Vitamin D deficiency disease - Vit D  25 hydroxy (rtn osteoporosis monitoring)   Continue all meds Labs pending Health Maintenance reviewed- Flu and pneumonia vaccine given Diet and exercise encouraged RTO 6 months  Christy Hawks, FNP   

## 2014-05-01 LAB — CMP14+EGFR
ALK PHOS: 53 IU/L (ref 39–117)
ALT: 19 IU/L (ref 0–44)
AST: 21 IU/L (ref 0–40)
Albumin/Globulin Ratio: 1.8 (ref 1.1–2.5)
Albumin: 4.4 g/dL (ref 3.6–4.8)
BILIRUBIN TOTAL: 0.9 mg/dL (ref 0.0–1.2)
BUN / CREAT RATIO: 22 (ref 10–22)
BUN: 22 mg/dL (ref 8–27)
CO2: 23 mmol/L (ref 18–29)
CREATININE: 1.01 mg/dL (ref 0.76–1.27)
Calcium: 9.4 mg/dL (ref 8.6–10.2)
Chloride: 102 mmol/L (ref 97–108)
GFR calc Af Amer: 93 mL/min/{1.73_m2} (ref >59)
GFR calc non Af Amer: 80 mL/min/{1.73_m2} (ref >59)
GLOBULIN, TOTAL: 2.4 g/dL (ref 1.5–4.5)
GLUCOSE: 112 mg/dL — AB (ref 65–99)
Potassium: 4.8 mmol/L (ref 3.5–5.2)
Sodium: 140 mmol/L (ref 134–144)
Total Protein: 6.8 g/dL (ref 6.0–8.5)

## 2014-05-01 LAB — LIPID PANEL
CHOL/HDL RATIO: 3.2 ratio (ref 0.0–5.0)
CHOLESTEROL TOTAL: 154 mg/dL (ref 100–199)
HDL: 48 mg/dL (ref >39)
LDL CALC: 83 mg/dL (ref 0–99)
Triglycerides: 117 mg/dL (ref 0–149)
VLDL CHOLESTEROL CAL: 23 mg/dL (ref 5–40)

## 2014-05-01 LAB — VITAMIN D 25 HYDROXY (VIT D DEFICIENCY, FRACTURES): Vit D, 25-Hydroxy: 32.3 ng/mL (ref 30.0–100.0)

## 2014-05-03 ENCOUNTER — Encounter: Payer: Self-pay | Admitting: Family

## 2014-05-05 ENCOUNTER — Telehealth: Payer: Self-pay | Admitting: Family

## 2014-05-05 NOTE — Telephone Encounter (Signed)
Called pt with lab results Verbalizes understanding Copy of labs mailed to pt

## 2014-06-28 ENCOUNTER — Other Ambulatory Visit: Payer: Self-pay | Admitting: Orthopaedic Surgery

## 2014-06-28 DIAGNOSIS — M25561 Pain in right knee: Secondary | ICD-10-CM

## 2014-07-01 ENCOUNTER — Ambulatory Visit
Admission: RE | Admit: 2014-07-01 | Discharge: 2014-07-01 | Disposition: A | Discharge status: Home / Self Care | Payer: BLUE CROSS/BLUE SHIELD | Source: Ambulatory Visit | Attending: Orthopaedic Surgery | Admitting: Orthopaedic Surgery

## 2014-07-01 DIAGNOSIS — M25561 Pain in right knee: Secondary | ICD-10-CM

## 2014-09-13 ENCOUNTER — Other Ambulatory Visit: Payer: Self-pay | Admitting: Orthopaedic Surgery

## 2014-09-13 DIAGNOSIS — M5410 Radiculopathy, site unspecified: Secondary | ICD-10-CM

## 2014-09-19 ENCOUNTER — Ambulatory Visit
Admission: RE | Admit: 2014-09-19 | Discharge: 2014-09-19 | Disposition: A | Discharge status: Home / Self Care | Payer: BLUE CROSS/BLUE SHIELD | Source: Ambulatory Visit | Attending: Orthopaedic Surgery | Admitting: Orthopaedic Surgery

## 2014-09-19 DIAGNOSIS — M5410 Radiculopathy, site unspecified: Secondary | ICD-10-CM

## 2014-10-27 ENCOUNTER — Other Ambulatory Visit: Payer: Self-pay | Admitting: *Deleted

## 2014-10-27 DIAGNOSIS — E119 Type 2 diabetes mellitus without complications: Secondary | ICD-10-CM

## 2014-10-27 MED ORDER — SITAGLIPTIN PHOS-METFORMIN HCL 50-1000 MG PO TABS: ORAL_TABLET | ORAL | Status: DC

## 2014-10-29 ENCOUNTER — Encounter (INDEPENDENT_AMBULATORY_CARE_PROVIDER_SITE_OTHER): Payer: Self-pay

## 2014-10-29 ENCOUNTER — Encounter: Payer: Self-pay | Admitting: Family

## 2014-10-29 ENCOUNTER — Ambulatory Visit (INDEPENDENT_AMBULATORY_CARE_PROVIDER_SITE_OTHER): Payer: BLUE CROSS/BLUE SHIELD | Admitting: Family

## 2014-10-29 VITALS — BP 140/81 | HR 70 | Temp 97.7°F | Ht 72.0 in | Wt 235.2 lb

## 2014-10-29 DIAGNOSIS — E119 Type 2 diabetes mellitus without complications: Secondary | ICD-10-CM | POA: Diagnosis not present

## 2014-10-29 DIAGNOSIS — M2550 Pain in unspecified joint: Secondary | ICD-10-CM | POA: Diagnosis not present

## 2014-10-29 DIAGNOSIS — I10 Essential (primary) hypertension: Secondary | ICD-10-CM

## 2014-10-29 DIAGNOSIS — E785 Hyperlipidemia, unspecified: Secondary | ICD-10-CM

## 2014-10-29 LAB — POCT UA - MICROALBUMIN: Microalbumin Ur, POC: 20 mg/L

## 2014-10-29 LAB — POCT GLYCOSYLATED HEMOGLOBIN (HGB A1C): Hemoglobin A1C: 6.9

## 2014-10-29 MED ORDER — RAMIPRIL 10 MG PO CAPS: 10.0000 mg | ORAL_CAPSULE | Freq: Every day | ORAL | Status: DC

## 2014-10-29 MED ORDER — CANAGLIFLOZIN 300 MG PO TABS: 300.0000 mg | ORAL_TABLET | Freq: Every day | ORAL | Status: DC

## 2014-10-29 MED ORDER — HYDROCHLOROTHIAZIDE 12.5 MG PO CAPS: 12.5000 mg | ORAL_CAPSULE | Freq: Every day | ORAL | Status: DC

## 2014-10-29 MED ORDER — SITAGLIPTIN PHOS-METFORMIN HCL 50-1000 MG PO TABS: ORAL_TABLET | ORAL | Status: DC

## 2014-10-29 MED ORDER — SIMVASTATIN 10 MG PO TABS: 10.0000 mg | ORAL_TABLET | Freq: Every day | ORAL | Status: DC

## 2014-10-29 NOTE — Addendum Note (Signed)
Addended by: Earlene Plater on: 10/29/2014 09:18 AM   Modules accepted: Orders

## 2014-10-29 NOTE — Patient Instructions (Signed)

## 2014-10-29 NOTE — Progress Notes (Signed)
Subjective:    Patient ID: Jason Rogers, male    DOB: [birth year], 59 y.o.   MRN: [id number]  Pt presents to the office today for chronic follow up. Pt has had right surgery on right knee and has had nerve conduction test and has been told he had a "pinch nerve in his right knee". Pt states he was unable to flex his foot and is following up with Dr, Ernestina Patches and Dr. Lorin Mercy, [place], on Friday.    Diabetes He presents for his follow-up diabetic visit. He has type 2 diabetes mellitus. His disease course has been stable. Hypoglycemia symptoms include headaches. Pertinent negatives for hypoglycemia include no confusion. Pertinent negatives for diabetes include no blurred vision, no foot paresthesias, no foot ulcerations and no visual change. Pertinent negatives for hypoglycemia complications include no blackouts and no hospitalization. Symptoms are stable. Pertinent negatives for diabetic complications include no CVA, heart disease, nephropathy or peripheral neuropathy. Risk factors for coronary artery disease include diabetes mellitus, dyslipidemia, family history, hypertension and male sex. Current diabetic treatment includes diet and oral agent (dual therapy). He is compliant with treatment all of the time. His weight is stable. He is following a generally healthy diet. (Pt does not take blood glucose ) An ACE inhibitor/angiotensin II receptor blocker is being taken. Eye exam is not current.  Hypertension This is a chronic problem. The current episode started more than 1 year ago. The problem has been resolved since onset. The problem is controlled. Associated symptoms include headaches. Pertinent negatives include no blurred vision, palpitations, peripheral edema or shortness of breath. Risk factors for coronary artery disease include diabetes mellitus, dyslipidemia, family history, male gender and sedentary lifestyle. Past treatments include ACE inhibitors and diuretics. There is no history of kidney  disease, CAD/MI, CVA, heart failure or a thyroid problem. There is no history of sleep apnea.  Hyperlipidemia This is a chronic problem. The current episode started more than 1 year ago. The problem is controlled. Recent lipid tests were reviewed and are normal. Exacerbating diseases include diabetes. He has no history of hypothyroidism. Pertinent negatives include no leg pain, myalgias or shortness of breath. Current antihyperlipidemic treatment includes statins. The current treatment provides significant improvement of lipids. Risk factors for coronary artery disease include diabetes mellitus, dyslipidemia, family history, hypertension, male sex, post-menopausal and a sedentary lifestyle.      Review of Systems  Constitutional: Negative.   Eyes: Negative for blurred vision.  Respiratory: Negative.  Negative for shortness of breath.   Cardiovascular: Negative.  Negative for palpitations.  Gastrointestinal: Negative.   Endocrine: Negative.   Genitourinary: Negative.   Musculoskeletal: Negative.  Negative for myalgias.  Neurological: Positive for headaches.  Hematological: Negative.   Psychiatric/Behavioral: Negative.  Negative for confusion.  All other systems reviewed and are negative.      Objective:   Physical Exam  Constitutional: He is oriented to person, place, and time. He appears well-developed and well-nourished. No distress.  HENT:  Head: Normocephalic.  Right Ear: External ear normal.  Left Ear: External ear normal.  Mouth/Throat: Oropharynx is clear and moist.  Eyes: Pupils are equal, round, and reactive to light. Right eye exhibits no discharge. Left eye exhibits no discharge.  Neck: Normal range of motion. Neck supple. No thyromegaly present.  Cardiovascular: Normal rate, regular rhythm, normal heart sounds and intact distal pulses.   No murmur heard. Pulmonary/Chest: Effort normal and breath sounds normal. No respiratory distress. He has no wheezes.  Abdominal:  Soft. Bowel sounds  are normal. He exhibits no distension. There is no tenderness.  Musculoskeletal: Normal range of motion. He exhibits no edema or tenderness.  Neurological: He is alert and oriented to person, place, and time. He has normal reflexes. No cranial nerve deficit.  Skin: Skin is warm and dry. No rash noted. No erythema.  Psychiatric: He has a normal mood and affect. His behavior is normal. Judgment and thought content normal.  Vitals reviewed.   BP 140/81 mmHg  Pulse 70  Temp(Src) 97.7 F (36.5 C) (Oral)  Ht 6' (1.829 m)  Wt 235 lb 3.2 oz (106.686 kg)  BMI 31.89 kg/m2       Assessment & Plan:  1. HLD (hyperlipidemia) - CMP14+EGFR - Lipid panel - simvastatin (ZOCOR) 10 MG tablet; Take 1 tablet (10 mg total) by mouth at bedtime.  Dispense: 90 tablet; Refill: 4  2. Type 2 diabetes mellitus without complication - POCT glycosylated hemoglobin (Hb A1C) - POCT UA - Microalbumin - CMP14+EGFR - sitaGLIPtin-metformin (JANUMET) 50-1000 MG per tablet; TAKE ONE TABLET BY MOUTH TWICE DAILY WITH A MEAL  Dispense: 180 tablet; Refill: 3 - canagliflozin (INVOKANA) 300 MG TABS tablet; Take 300 mg by mouth daily.  Dispense: 90 tablet; Refill: 4  3. Essential hypertension - CMP14+EGFR - ramipril (ALTACE) 10 MG capsule; Take 1 capsule (10 mg total) by mouth daily.  Dispense: 90 capsule; Refill: 4 - hydrochlorothiazide (MICROZIDE) 12.5 MG capsule; Take 1 capsule (12.5 mg total) by mouth daily.  Dispense: 90 capsule; Refill: 3  4. Joint pain - Arthritis Panel   Continue all meds Labs pending Health Maintenance reviewed Diet and exercise encouraged RTO 6 months  Evelina Dun, FNP

## 2014-10-30 LAB — ARTHRITIS PANEL
Basophils Absolute: 0.1 10*3/uL (ref 0.0–0.2)
Basos: 1 %
EOS (ABSOLUTE): 0.2 10*3/uL (ref 0.0–0.4)
Eos: 2 %
HEMATOCRIT: 46.5 % (ref 37.5–51.0)
HEMOGLOBIN: 15.6 g/dL (ref 12.6–17.7)
Immature Grans (Abs): 0 10*3/uL (ref 0.0–0.1)
Immature Granulocytes: 0 %
LYMPHS ABS: 2 10*3/uL (ref 0.7–3.1)
Lymphs: 25 %
MCH: 28.4 pg (ref 26.6–33.0)
MCHC: 33.5 g/dL (ref 31.5–35.7)
MCV: 85 fL (ref 79–97)
MONOCYTES: 9 %
MONOS ABS: 0.7 10*3/uL (ref 0.1–0.9)
NEUTROS ABS: 4.9 10*3/uL (ref 1.4–7.0)
Neutrophils: 63 %
PLATELETS: 209 10*3/uL (ref 150–379)
RBC: 5.49 x10E6/uL (ref 4.14–5.80)
RDW: 14.6 % (ref 12.3–15.4)
Rhuematoid fact SerPl-aCnc: <10 IU/mL (ref 0.0–13.9)
SED RATE: 2 mm/h (ref 0–30)
Uric Acid: 4.3 mg/dL (ref 3.7–8.6)
WBC: 7.8 10*3/uL (ref 3.4–10.8)

## 2014-10-30 LAB — CMP14+EGFR
ALBUMIN: 4.2 g/dL (ref 3.6–4.8)
ALK PHOS: 57 IU/L (ref 39–117)
ALT: 20 IU/L (ref 0–44)
AST: 15 IU/L (ref 0–40)
Albumin/Globulin Ratio: 1.8 (ref 1.1–2.5)
BUN / CREAT RATIO: 23 — AB (ref 10–22)
BUN: 24 mg/dL (ref 8–27)
Bilirubin Total: 0.9 mg/dL (ref 0.0–1.2)
CALCIUM: 9 mg/dL (ref 8.6–10.2)
CO2: 22 mmol/L (ref 18–29)
CREATININE: 1.03 mg/dL (ref 0.76–1.27)
Chloride: 101 mmol/L (ref 97–108)
GFR, EST AFRICAN AMERICAN: 91 mL/min/{1.73_m2} (ref >59)
GFR, EST NON AFRICAN AMERICAN: 79 mL/min/{1.73_m2} (ref >59)
GLOBULIN, TOTAL: 2.3 g/dL (ref 1.5–4.5)
GLUCOSE: 128 mg/dL — AB (ref 65–99)
Potassium: 4.6 mmol/L (ref 3.5–5.2)
SODIUM: 139 mmol/L (ref 134–144)
TOTAL PROTEIN: 6.5 g/dL (ref 6.0–8.5)

## 2014-10-30 LAB — LIPID PANEL
CHOL/HDL RATIO: 3.7 ratio (ref 0.0–5.0)
CHOLESTEROL TOTAL: 148 mg/dL (ref 100–199)
HDL: 40 mg/dL (ref >39)
LDL CALC: 69 mg/dL (ref 0–99)
Triglycerides: 195 mg/dL — ABNORMAL HIGH (ref 0–149)
VLDL CHOLESTEROL CAL: 39 mg/dL (ref 5–40)

## 2014-10-30 LAB — MICROALBUMIN, URINE: Microalbumin, Urine: <3 ug/mL

## 2014-11-02 ENCOUNTER — Other Ambulatory Visit: Payer: Self-pay | Admitting: Family

## 2014-11-02 MED ORDER — LISINOPRIL 20 MG PO TABS: 20.0000 mg | ORAL_TABLET | Freq: Every day | ORAL | Status: DC

## 2014-11-05 ENCOUNTER — Ambulatory Visit: Payer: BLUE CROSS/BLUE SHIELD | Admitting: Family

## 2015-01-11 ENCOUNTER — Telehealth: Payer: Self-pay | Admitting: Family

## 2015-01-11 NOTE — Telephone Encounter (Signed)
scheduled

## 2015-02-04 ENCOUNTER — Ambulatory Visit (INDEPENDENT_AMBULATORY_CARE_PROVIDER_SITE_OTHER): Payer: BLUE CROSS/BLUE SHIELD | Admitting: *Deleted

## 2015-02-04 VITALS — BP 112/77

## 2015-02-04 DIAGNOSIS — Z23 Encounter for immunization: Secondary | ICD-10-CM | POA: Diagnosis not present

## 2015-02-04 DIAGNOSIS — I10 Essential (primary) hypertension: Secondary | ICD-10-CM

## 2015-02-04 NOTE — Progress Notes (Signed)
Pt here for BP check

## 2015-04-08 LAB — HM DIABETES EYE EXAM

## 2015-04-26 ENCOUNTER — Telehealth: Payer: Self-pay | Admitting: Family

## 2015-04-26 MED ORDER — TRIAMCINOLONE ACETONIDE 0.025 % EX OINT: 1.0000 "application " | TOPICAL_OINTMENT | Freq: Two times a day (BID) | CUTANEOUS | Status: DC

## 2015-04-26 NOTE — Telephone Encounter (Signed)
Kenalog cream Prescription sent to pharmacy

## 2015-04-26 NOTE — Telephone Encounter (Signed)
Pt aware.

## 2015-04-27 ENCOUNTER — Ambulatory Visit (INDEPENDENT_AMBULATORY_CARE_PROVIDER_SITE_OTHER): Payer: BLUE CROSS/BLUE SHIELD | Admitting: Family Medicine

## 2015-04-27 ENCOUNTER — Encounter: Payer: Self-pay | Admitting: Family Medicine

## 2015-04-27 VITALS — BP 127/73 | HR 89 | Temp 98.4°F | Ht 73.83 in | Wt 236.6 lb

## 2015-04-27 DIAGNOSIS — L259 Unspecified contact dermatitis, unspecified cause: Secondary | ICD-10-CM

## 2015-04-27 MED ORDER — TRIAMCINOLONE ACETONIDE 0.5 % EX OINT: 1.0000 "application " | TOPICAL_OINTMENT | Freq: Two times a day (BID) | CUTANEOUS | Status: DC

## 2015-04-27 MED ORDER — TRIAMCINOLONE ACETONIDE 40 MG/ML IJ SUSP
40.0000 mg | Freq: Once | INTRAMUSCULAR | Status: AC
  Administered 2015-04-27: 40 mg via INTRAMUSCULAR

## 2015-04-27 NOTE — Progress Notes (Signed)
   HPI  Patient presents today here today with persistent and worsening rash.  Patient explains that last Saturday he was cleaning out some bushes and he developed a very itchy red rash after that. He was started on some triamcinolone ointment 2 days ago which has not improved his pain much, lotions also not helping much He has diabetes, he understands that systemic steroids will increase his blood sugar.  Denies any areas of draining, induration, pain  Normal food and fluid tolerance, normal breathing, no chest pain  PMH: Smoking status noted ROS: Per HPI  Objective: BP 127/73 mmHg  Pulse 89  Temp(Src) 98.4 F (36.9 C) (Oral)  Ht 6' 1.83" (1.875 m)  Wt 236 lb 9.6 oz (107.321 kg)  BMI 30.53 kg/m2 Gen: NAD, alert, cooperative with exam HEENT: NCAT CV: RRR, good S1/S2, no murmur Resp: CTABL, no wheezes, non-labored Ext: No edema, warm Neuro: Alert and oriented, No gross deficits Skin: Lateral arms, abdomen, with erythematous papular rash with excoriations and dried on calamine lotion present  Assessment and plan:  # Contact dermatitis Likely poison ivy versus poison oak. rash in his groin as well Given IM Kenalog today, he understands the repercussions this will have on his glucose control Increase potency of topical steroids Follow up as previously planned   Meds ordered this encounter  Medications  . Cholecalciferol (VITAMIN D) 2000 units CAPS    Sig: Take by mouth.  . triamcinolone ointment (KENALOG) 0.5 %    Sig: Apply 1 application topically 2 (two) times daily.    Dispense:  30 g    Refill:  0  . triamcinolone acetonide (KENALOG-40) injection 40 mg    Sig:     Laroy Apple, MD [facility] 04/27/2015, 4:48 PM

## 2015-04-27 NOTE — Patient Instructions (Signed)
Great to meet you!  Contact Dermatitis Dermatitis is redness, soreness, and swelling (inflammation) of the skin. Contact dermatitis is a reaction to certain substances that touch the skin. There are two types of contact dermatitis:   Irritant contact dermatitis. This type is caused by something that irritates your skin, such as dry hands from washing them too much. This type does not require previous exposure to the substance for a reaction to occur. This type is more common.  Allergic contact dermatitis. This type is caused by a substance that you are allergic to, such as a nickel allergy or poison ivy. This type only occurs if you have been exposed to the substance (allergen) before. Upon a repeat exposure, your body reacts to the substance. This type is less common. CAUSES  Many different substances can cause contact dermatitis. Irritant contact dermatitis is most commonly caused by exposure to:   Makeup.   Soaps.   Detergents.   Bleaches.   Acids.   Metal salts, such as nickel.  Allergic contact dermatitis is most commonly caused by exposure to:   Poisonous plants.   Chemicals.   Jewelry.   Latex.   Medicines.   Preservatives in products, such as clothing.  RISK FACTORS This condition is more likely to develop in:   People who have jobs that expose them to irritants or allergens.  People who have certain medical conditions, such as asthma or eczema.  SYMPTOMS  Symptoms of this condition may occur anywhere on your body where the irritant has touched you or is touched by you. Symptoms include:  Dryness or flaking.   Redness.   Cracks.   Itching.   Pain or a burning feeling.   Blisters.  Drainage of small amounts of blood or clear fluid from skin cracks. With allergic contact dermatitis, there may also be swelling in areas such as the eyelids, mouth, or genitals.  DIAGNOSIS  This condition is diagnosed with a medical history and physical  exam. A patch skin test may be performed to help determine the cause. If the condition is related to your job, you may need to see an occupational medicine specialist. TREATMENT Treatment for this condition includes figuring out what caused the reaction and protecting your skin from further contact. Treatment may also include:   Steroid creams or ointments. Oral steroid medicines may be needed in more severe cases.  Antibiotics or antibacterial ointments, if a skin infection is present.  Antihistamine lotion or an antihistamine taken by mouth to ease itching.  A bandage (dressing). HOME CARE INSTRUCTIONS Skin Care  Moisturize your skin as needed.   Apply cool compresses to the affected areas.  Try taking a bath with:  Epsom salts. Follow the instructions on the packaging. You can get these at your local pharmacy or grocery store.  Baking soda. Pour a small amount into the bath as directed by your health care provider.  Colloidal oatmeal. Follow the instructions on the packaging. You can get this at your local pharmacy or grocery store.  Try applying baking soda paste to your skin. Stir water into baking soda until it reaches a paste-like consistency.  Do not scratch your skin.  Bathe less frequently, such as every other day.  Bathe in lukewarm water. Avoid using hot water. Medicines  Take or apply over-the-counter and prescription medicines only as told by your health care provider.   If you were prescribed an antibiotic medicine, take or apply your antibiotic as told by your health care provider.  Do not stop using the antibiotic even if your condition starts to improve. General Instructions  Keep all follow-up visits as told by your health care provider. This is important.  Avoid the substance that caused your reaction. If you do not know what caused it, keep a journal to try to track what caused it. Write down:  What you eat.  What cosmetic products you  use.  What you drink.  What you wear in the affected area. This includes jewelry.  If you were given a dressing, take care of it as told by your health care provider. This includes when to change and remove it. SEEK MEDICAL CARE IF:   Your condition does not improve with treatment.  Your condition gets worse.  You have signs of infection such as swelling, tenderness, redness, soreness, or warmth in the affected area.  You have a fever.  You have new symptoms. SEEK IMMEDIATE MEDICAL CARE IF:   You have a severe headache, neck pain, or neck stiffness.  You vomit.  You feel very sleepy.  You notice red streaks coming from the affected area.  Your bone or joint underneath the affected area becomes painful after the skin has healed.  The affected area turns darker.  You have difficulty breathing.   This information is not intended to replace advice given to you by your health care provider. Make sure you discuss any questions you have with your health care provider.   Document Released: 02/10/2000 Document Revised: 11/03/2014 Document Reviewed: 06/30/2014 Elsevier Interactive Patient Education [employer].

## 2015-04-29 ENCOUNTER — Telehealth: Payer: Self-pay | Admitting: Family

## 2015-04-29 ENCOUNTER — Ambulatory Visit (INDEPENDENT_AMBULATORY_CARE_PROVIDER_SITE_OTHER): Payer: BLUE CROSS/BLUE SHIELD | Admitting: Family

## 2015-04-29 ENCOUNTER — Encounter: Payer: Self-pay | Admitting: Family

## 2015-04-29 VITALS — BP 137/90 | HR 61 | Temp 96.8°F | Ht 73.5 in | Wt 233.0 lb

## 2015-04-29 DIAGNOSIS — R1310 Dysphagia, unspecified: Secondary | ICD-10-CM

## 2015-04-29 DIAGNOSIS — I10 Essential (primary) hypertension: Secondary | ICD-10-CM | POA: Diagnosis not present

## 2015-04-29 DIAGNOSIS — Z125 Encounter for screening for malignant neoplasm of prostate: Secondary | ICD-10-CM

## 2015-04-29 DIAGNOSIS — E119 Type 2 diabetes mellitus without complications: Secondary | ICD-10-CM

## 2015-04-29 DIAGNOSIS — E785 Hyperlipidemia, unspecified: Secondary | ICD-10-CM | POA: Diagnosis not present

## 2015-04-29 DIAGNOSIS — L237 Allergic contact dermatitis due to plants, except food: Secondary | ICD-10-CM

## 2015-04-29 LAB — POCT GLYCOSYLATED HEMOGLOBIN (HGB A1C): Hemoglobin A1C: 7.2

## 2015-04-29 MED ORDER — METHYLPREDNISOLONE ACETATE 40 MG/ML IJ SUSP
40.0000 mg | Freq: Once | INTRAMUSCULAR | Status: AC
  Administered 2015-04-29: 40 mg via INTRAMUSCULAR

## 2015-04-29 MED ORDER — OMEPRAZOLE 20 MG PO CPDR: 20.0000 mg | DELAYED_RELEASE_CAPSULE | Freq: Every day | ORAL | Status: DC

## 2015-04-29 NOTE — Progress Notes (Signed)
Subjective:    Patient ID: Jason Rogers, male    DOB: Nov 13, 1955, 60 y.o.   MRN: [id number]  Pt presents to the office today for chronic follow up. PT states several times over the last few years he has been getting "strangled and choked" while drinking. Pt states he gets so " choked at times he passes out". PT states he has passed out 3 times that last a couple of minutes.  Diabetes He presents for his follow-up diabetic visit. He has type 2 diabetes mellitus. His disease course has been stable. Hypoglycemia symptoms include headaches. Pertinent negatives for hypoglycemia include no confusion. Pertinent negatives for diabetes include no blurred vision, no foot paresthesias, no foot ulcerations and no visual change. Pertinent negatives for hypoglycemia complications include no blackouts and no hospitalization. Symptoms are stable. Pertinent negatives for diabetic complications include no CVA, heart disease, nephropathy or peripheral neuropathy. Risk factors for coronary artery disease include diabetes mellitus, dyslipidemia, family history, hypertension and male sex. Current diabetic treatment includes diet and oral agent (dual therapy). He is compliant with treatment all of the time. His weight is stable. He is following a generally healthy diet. (Pt does not take blood glucose ) An ACE inhibitor/angiotensin II receptor blocker is being taken. Eye exam is current (01/27/15).  Hypertension This is a chronic problem. The current episode started more than 1 year ago. The problem has been resolved since onset. The problem is controlled. Associated symptoms include headaches. Pertinent negatives include no blurred vision, palpitations, peripheral edema or shortness of breath. Risk factors for coronary artery disease include diabetes mellitus, dyslipidemia, family history, male gender and sedentary lifestyle. Past treatments include ACE inhibitors and diuretics. There is no history of kidney disease,  CAD/MI, CVA, heart failure or a thyroid problem. There is no history of sleep apnea.  Hyperlipidemia This is a chronic problem. The current episode started more than 1 year ago. The problem is controlled. Recent lipid tests were reviewed and are normal. Exacerbating diseases include diabetes. He has no history of hypothyroidism. Pertinent negatives include no leg pain, myalgias or shortness of breath. Current antihyperlipidemic treatment includes statins. The current treatment provides significant improvement of lipids. Risk factors for coronary artery disease include diabetes mellitus, dyslipidemia, family history, hypertension, male sex, post-menopausal and a sedentary lifestyle.  Rash This is a new problem. The current episode started in the past 7 days. The problem is unchanged. The affected locations include the face, head, chest, left arm and right arm. The rash is characterized by dryness and itchiness. He was exposed to plant contact. Pertinent negatives include no congestion, cough, eye pain or shortness of breath.      Review of Systems  Constitutional: Negative.   HENT: Negative for congestion.   Eyes: Negative for blurred vision and pain.  Respiratory: Negative.  Negative for cough and shortness of breath.   Cardiovascular: Negative.  Negative for palpitations.  Gastrointestinal: Negative.   Endocrine: Negative.   Genitourinary: Negative.   Musculoskeletal: Negative.  Negative for myalgias.  Skin: Positive for rash.  Neurological: Positive for headaches.  Hematological: Negative.   Psychiatric/Behavioral: Negative.  Negative for confusion.  All other systems reviewed and are negative.      Objective:   Physical Exam  Constitutional: He is oriented to person, place, and time. He appears well-developed and well-nourished. No distress.  HENT:  Head: Normocephalic.  Right Ear: External ear normal.  Left Ear: External ear normal.  Nose: Nose normal.  Mouth/Throat: Oropharynx  is  clear and moist.  Eyes: Pupils are equal, round, and reactive to light. Right eye exhibits no discharge. Left eye exhibits no discharge.  Neck: Normal range of motion. Neck supple. No thyromegaly present.  Cardiovascular: Normal rate, regular rhythm, normal heart sounds and intact distal pulses.   No murmur heard. Pulmonary/Chest: Effort normal and breath sounds normal. No respiratory distress. He has no wheezes.  Abdominal: Soft. Bowel sounds are normal. He exhibits no distension. There is no tenderness.  Musculoskeletal: Normal range of motion. He exhibits no edema or tenderness.  Neurological: He is alert and oriented to person, place, and time. He has normal reflexes. No cranial nerve deficit.  Skin: Skin is warm and dry. No rash noted. No erythema.  Psychiatric: He has a normal mood and affect. His behavior is normal. Judgment and thought content normal.  Vitals reviewed.   BP 137/90 mmHg  Pulse 61  Temp(Src) 96.8 F (36 C) (Oral)  Ht 6' 1.5" (1.867 m)  Wt 233 lb (105.688 kg)  BMI 30.32 kg/m2       Assessment & Plan:  1. Essential hypertension - CMP14+EGFR  2. Type 2 diabetes mellitus without complication, without long-term current use of insulin (HCC) - POCT glycosylated hemoglobin (Hb A1C) - CMP14+EGFR  3. HLD (hyperlipidemia) - CMP14+EGFR - Lipid panel  4. Prostate cancer screening - CMP14+EGFR - PSA, total and free  5. Dysphagia -PT started on prilosec 20 mg today - CMP14+EGFR - omeprazole (PRILOSEC) 20 MG capsule; Take 1 capsule (20 mg total) by mouth daily.  Dispense: 30 capsule; Refill: 3 - Ambulatory referral to Gastroenterology  6. Contact dermatitis due to poison oak -Good hand hygiene discussed - methylPREDNISolone acetate (DEPO-MEDROL) injection 40 mg; Inject 1 mL (40 mg total) into the muscle once.   Continue all meds Labs pending Health Maintenance reviewed Diet and exercise encouraged RTO 6 months  Evelina Dun, FNP

## 2015-04-29 NOTE — Addendum Note (Signed)
Addended by: Shelbie Ammons on: 04/29/2015 09:07 AM   Modules accepted: Miquel Dunn

## 2015-04-29 NOTE — Patient Instructions (Signed)
Health Maintenance, Male A healthy lifestyle and preventative care can promote health and wellness.  Maintain regular health, dental, and eye exams.  Eat a healthy diet. Foods like vegetables, fruits, whole grains, low-fat dairy products, and lean protein foods contain the nutrients you need and are low in calories. Decrease your intake of foods high in solid fats, added sugars, and salt. Get information about a proper diet from your health care provider, if necessary.  Regular physical exercise is one of the most important things you can do for your health. Most adults should get at least 150 minutes of moderate-intensity exercise (any activity that increases your heart rate and causes you to sweat) each week. In addition, most adults need muscle-strengthening exercises on 2 or more days a week.   Maintain a healthy weight. The body mass index (BMI) is a screening tool to identify possible weight problems. It provides an estimate of body fat based on height and weight. Your health care provider can find your BMI and can help you achieve or maintain a healthy weight. For males 20 years and older:  A BMI below 18.5 is considered underweight.  A BMI of 18.5 to 24.9 is normal.  A BMI of 25 to 29.9 is considered overweight.  A BMI of 30 and above is considered obese.  Maintain normal blood lipids and cholesterol by exercising and minimizing your intake of saturated fat. Eat a balanced diet with plenty of fruits and vegetables. Blood tests for lipids and cholesterol should begin at age 20 and be repeated every 5 years. If your lipid or cholesterol levels are high, you are over age 50, or you are at high risk for heart disease, you may need your cholesterol levels checked more frequently.Ongoing high lipid and cholesterol levels should be treated with medicines if diet and exercise are not working.  If you smoke, find out from your health care provider how to quit. If you do not use tobacco, do not  start.  Lung cancer screening is recommended for adults aged 55-80 years who are at high risk for developing lung cancer because of a history of smoking. A yearly low-dose CT scan of the lungs is recommended for people who have at least a 30-pack-year history of smoking and are current smokers or have quit within the past 15 years. A pack year of smoking is smoking an average of 1 pack of cigarettes a day for 1 year (for example, a 30-pack-year history of smoking could mean smoking 1 pack a day for 30 years or 2 packs a day for 15 years). Yearly screening should continue until the smoker has stopped smoking for at least 15 years. Yearly screening should be stopped for people who develop a health problem that would prevent them from having lung cancer treatment.  If you choose to drink alcohol, do not have more than 2 drinks per day. One drink is considered to be 12 oz (360 mL) of beer, 5 oz (150 mL) of wine, or 1.5 oz (45 mL) of liquor.  Avoid the use of street drugs. Do not share needles with anyone. Ask for help if you need support or instructions about stopping the use of drugs.  High blood pressure causes heart disease and increases the risk of stroke. High blood pressure is more likely to develop in:  People who have blood pressure in the end of the normal range (100-139/85-89 mm Hg).  People who are overweight or obese.  People who are African American.    If you are 18-39 years of age, have your blood pressure checked every 3-5 years. If you are 40 years of age or older, have your blood pressure checked every year. You should have your blood pressure measured twice--once when you are at a hospital or clinic, and once when you are not at a hospital or clinic. Record the average of the two measurements. To check your blood pressure when you are not at a hospital or clinic, you can use:  An automated blood pressure machine at a pharmacy.  A home blood pressure monitor.  If you are 45-79 years  old, ask your health care provider if you should take aspirin to prevent heart disease.  Diabetes screening involves taking a blood sample to check your fasting blood sugar level. This should be done once every 3 years after age 45 if you are at a normal weight and without risk factors for diabetes. Testing should be considered at a younger age or be carried out more frequently if you are overweight and have at least 1 risk factor for diabetes.  Colorectal cancer can be detected and often prevented. Most routine colorectal cancer screening begins at the age of 50 and continues through age 75. However, your health care provider may recommend screening at an earlier age if you have risk factors for colon cancer. On a yearly basis, your health care provider may provide home test kits to check for hidden blood in the stool. A small camera at the end of a tube may be used to directly examine the colon (sigmoidoscopy or colonoscopy) to detect the earliest forms of colorectal cancer. Talk to your health care provider about this at age 50 when routine screening begins. A direct exam of the colon should be repeated every 5-10 years through age 75, unless early forms of precancerous polyps or small growths are found.  People who are at an increased risk for hepatitis B should be screened for this virus. You are considered at high risk for hepatitis B if:  You were born in a country where hepatitis B occurs often. Talk with your health care provider about which countries are considered high risk.  Your parents were born in a high-risk country and you have not received a shot to protect against hepatitis B (hepatitis B vaccine).  You have HIV or AIDS.  You use needles to inject street drugs.  You live with, or have sex with, someone who has hepatitis B.  You are a man who has sex with other men (MSM).  You get hemodialysis treatment.  You take certain medicines for conditions like cancer, organ  transplantation, and autoimmune conditions.  Hepatitis C blood testing is recommended for all people born from 1945 through 1965 and any individual with known risk factors for hepatitis C.  Healthy men should no longer receive prostate-specific antigen (PSA) blood tests as part of routine cancer screening. Talk to your health care provider about prostate cancer screening.  Testicular cancer screening is not recommended for adolescents or adult males who have no symptoms. Screening includes self-exam, a health care provider exam, and other screening tests. Consult with your health care provider about any symptoms you have or any concerns you have about testicular cancer.  Practice safe sex. Use condoms and avoid high-risk sexual practices to reduce the spread of sexually transmitted infections (STIs).  You should be screened for STIs, including gonorrhea and chlamydia if:  You are sexually active and are younger than 24 years.  You   are older than 24 years, and your health care provider tells you that you are at risk for this type of infection.  Your sexual activity has changed since you were last screened, and you are at an increased risk for chlamydia or gonorrhea. Ask your health care provider if you are at risk.  If you are at risk of being infected with HIV, it is recommended that you take a prescription medicine daily to prevent HIV infection. This is called pre-exposure prophylaxis (PrEP). You are considered at risk if:  You are a man who has sex with other men (MSM).  You are a heterosexual man who is sexually active with multiple partners.  You take drugs by injection.  You are sexually active with a partner who has HIV.  Talk with your health care provider about whether you are at high risk of being infected with HIV. If you choose to begin PrEP, you should first be tested for HIV. You should then be tested every 3 months for as long as you are taking PrEP.  Use sunscreen. Apply  sunscreen liberally and repeatedly throughout the day. You should seek shade when your shadow is shorter than you. Protect yourself by wearing long sleeves, pants, a wide-brimmed hat, and sunglasses year round whenever you are outdoors.  Tell your health care provider of new moles or changes in moles, especially if there is a change in shape or color. Also, tell your health care provider if a mole is larger than the size of a pencil eraser.  A one-time screening for abdominal aortic aneurysm (AAA) and surgical repair of large AAAs by ultrasound is recommended for men aged 58-75 years who are current or former smokers.  Stay current with your vaccines (immunizations).   This information is not intended to replace advice given to you by your health care provider. Make sure you discuss any questions you have with your health care provider.   Document Released: 08/11/2007 Document Revised: 03/05/2014 Document Reviewed: 07/10/2010 Elsevier Interactive Patient Education 2016 [employer] Dysphagia Swallowing problems (dysphagia) occur when solids and liquids seem to stick in your throat on the way down to your stomach, or the food takes longer to get to the stomach. Other symptoms include regurgitating food, noises coming from the throat, chest discomfort with swallowing, and a feeling of fullness or the feeling of something being stuck in your throat when swallowing. When blockage in your throat is complete, it may be associated with drooling. CAUSES  Problems with swallowing may occur because of problems with the muscles. The food cannot be propelled in the usual manner into your stomach. You may have ulcers, scar tissue, or inflammation in the tube down which food travels from your mouth to your stomach (esophagus), which blocks food from passing normally into the stomach. Causes of inflammation include:  Acid reflux from your stomach into your esophagus.  Infection.  Radiation treatment for  cancer.  Medicines taken without enough fluids to wash them down into your stomach. You may have nerve problems that prevent signals from being sent to the muscles of your esophagus to contract and move your food down to your stomach. Globus pharyngeus is a relatively common problem in which there is a sense of an obstruction or difficulty in swallowing, without any physical abnormalities of the swallowing passages being found. This problem usually improves over time with reassurance and testing to rule out other causes. DIAGNOSIS Dysphagia can be diagnosed and its cause can be determined by tests  in which you swallow a white substance that helps illuminate the inside of your throat (contrast medium) while X-rays are taken. Sometimes a flexible telescope that is inserted down your throat (endoscopy) to look at your esophagus and stomach is used. TREATMENT   If the dysphagia is caused by acid reflux or infection, medicines may be used.  If the dysphagia is caused by problems with your swallowing muscles, swallowing therapy may be used to help you strengthen your swallowing muscles.  If the dysphagia is caused by a blockage or mass, procedures to remove the blockage may be done. HOME CARE INSTRUCTIONS  Try to eat soft food that is easier to swallow and check your weight on a daily basis to be sure that it is not decreasing.  Be sure to drink liquids when sitting upright (not lying down). SEEK MEDICAL CARE IF:  You are losing weight because you are unable to swallow.  You are coughing when you drink liquids (aspiration).  You are coughing up partially digested food. SEEK IMMEDIATE MEDICAL CARE IF:  You are unable to swallow your own saliva .  You are having shortness of breath or a fever, or both.  You have a hoarse voice along with difficulty swallowing. MAKE SURE YOU:  Understand these instructions.  Will watch your condition.  Will get help right away if you are not doing well  or get worse.   This information is not intended to replace advice given to you by your health care provider. Make sure you discuss any questions you have with your health care provider.   Document Released: 02/10/2000 Document Revised: 03/05/2014 Document Reviewed: 08/01/2012 Elsevier Interactive Patient Education [employer].

## 2015-04-29 NOTE — Telephone Encounter (Signed)
Noted in referral.

## 2015-04-30 LAB — CMP14+EGFR
ALBUMIN: 4.4 g/dL (ref 3.6–4.8)
ALT: 21 IU/L (ref 0–44)
AST: 17 IU/L (ref 0–40)
Albumin/Globulin Ratio: 1.9 (ref 1.1–2.5)
Alkaline Phosphatase: 54 IU/L (ref 39–117)
BILIRUBIN TOTAL: 0.6 mg/dL (ref 0.0–1.2)
BUN / CREAT RATIO: 24 — AB (ref 10–22)
BUN: 24 mg/dL (ref 8–27)
CALCIUM: 9.7 mg/dL (ref 8.6–10.2)
CO2: 20 mmol/L (ref 18–29)
CREATININE: 1 mg/dL (ref 0.76–1.27)
Chloride: 103 mmol/L (ref 96–106)
GFR, EST AFRICAN AMERICAN: 93 mL/min/{1.73_m2} (ref >59)
GFR, EST NON AFRICAN AMERICAN: 81 mL/min/{1.73_m2} (ref >59)
GLUCOSE: 149 mg/dL — AB (ref 65–99)
Globulin, Total: 2.3 g/dL (ref 1.5–4.5)
Potassium: 5 mmol/L (ref 3.5–5.2)
SODIUM: 140 mmol/L (ref 134–144)
TOTAL PROTEIN: 6.7 g/dL (ref 6.0–8.5)

## 2015-04-30 LAB — LIPID PANEL
CHOL/HDL RATIO: 3.5 ratio (ref 0.0–5.0)
Cholesterol, Total: 154 mg/dL (ref 100–199)
HDL: 44 mg/dL (ref >39)
LDL CALC: 86 mg/dL (ref 0–99)
Triglycerides: 118 mg/dL (ref 0–149)
VLDL Cholesterol Cal: 24 mg/dL (ref 5–40)

## 2015-04-30 LAB — PSA, TOTAL AND FREE
PROSTATE SPECIFIC AG, SERUM: 1.8 ng/mL (ref 0.0–4.0)
PSA FREE PCT: 15.6 %
PSA FREE: 0.28 ng/mL

## 2015-05-02 NOTE — Progress Notes (Signed)
Patient aware.

## 2015-05-23 ENCOUNTER — Encounter: Payer: Self-pay | Admitting: Family Medicine

## 2015-05-23 ENCOUNTER — Ambulatory Visit (INDEPENDENT_AMBULATORY_CARE_PROVIDER_SITE_OTHER): Payer: BLUE CROSS/BLUE SHIELD | Admitting: Family Medicine

## 2015-05-23 VITALS — BP 144/79 | HR 84 | Temp 97.0°F | Ht 73.5 in | Wt 233.8 lb

## 2015-05-23 DIAGNOSIS — M5410 Radiculopathy, site unspecified: Secondary | ICD-10-CM

## 2015-05-23 DIAGNOSIS — M792 Neuralgia and neuritis, unspecified: Secondary | ICD-10-CM | POA: Diagnosis not present

## 2015-05-23 MED ORDER — GABAPENTIN 100 MG PO CAPS: 300.0000 mg | ORAL_CAPSULE | Freq: Every day | ORAL | Status: DC

## 2015-05-23 NOTE — Patient Instructions (Signed)
Great to see you!  Look at the handout and try the exercises, like I said this may be piriformis syndrome  Try gabapentin, 3 capsules tonight, continue this for 3 nights.   If you do not see any improvement go up to 400 mg for nights, then 500 mg for 3 nights then 600 mg for 3 nights. Don't take more than 600 mg (6 capsules) at a  Time without calling and letting me know  Sciatica Sciatica is pain, weakness, numbness, or tingling along the path of the sciatic nerve. The nerve starts in the lower back and runs down the back of each leg. The nerve controls the muscles in the lower leg and in the back of the knee, while also providing sensation to the back of the thigh, lower leg, and the sole of your foot. Sciatica is a symptom of another medical condition. For instance, nerve damage or certain conditions, such as a herniated disk or bone spur on the spine, pinch or put pressure on the sciatic nerve. This causes the pain, weakness, or other sensations normally associated with sciatica. Generally, sciatica only affects one side of the body. CAUSES   Herniated or slipped disc.  Degenerative disk disease.  A pain disorder involving the narrow muscle in the buttocks (piriformis syndrome).  Pelvic injury or fracture.  Pregnancy.  Tumor (rare). SYMPTOMS  Symptoms can vary from mild to very severe. The symptoms usually travel from the low back to the buttocks and down the back of the leg. Symptoms can include:  Mild tingling or dull aches in the lower back, leg, or hip.  Numbness in the back of the calf or sole of the foot.  Burning sensations in the lower back, leg, or hip.  Sharp pains in the lower back, leg, or hip.  Leg weakness.  Severe back pain inhibiting movement. These symptoms may get worse with coughing, sneezing, laughing, or prolonged sitting or standing. Also, being overweight may worsen symptoms. DIAGNOSIS  Your caregiver will perform a physical exam to look for common  symptoms of sciatica. He or she may ask you to do certain movements or activities that would trigger sciatic nerve pain. Other tests may be performed to find the cause of the sciatica. These may include:  Blood tests.  X-rays.  Imaging tests, such as an MRI or CT scan. TREATMENT  Treatment is directed at the cause of the sciatic pain. Sometimes, treatment is not necessary and the pain and discomfort goes away on its own. If treatment is needed, your caregiver may suggest:  Over-the-counter medicines to relieve pain.  Prescription medicines, such as anti-inflammatory medicine, muscle relaxants, or narcotics.  Applying heat or ice to the painful area.  Steroid injections to lessen pain, irritation, and inflammation around the nerve.  Reducing activity during periods of pain.  Exercising and stretching to strengthen your abdomen and improve flexibility of your spine. Your caregiver may suggest losing weight if the extra weight makes the back pain worse.  Physical therapy.  Surgery to eliminate what is pressing or pinching the nerve, such as a bone spur or part of a herniated disk. HOME CARE INSTRUCTIONS   Only take over-the-counter or prescription medicines for pain or discomfort as directed by your caregiver.  Apply ice to the affected area for 20 minutes, 3-4 times a day for the first 48-72 hours. Then try heat in the same way.  Exercise, stretch, or perform your usual activities if these do not aggravate your pain.  Attend  physical therapy sessions as directed by your caregiver.  Keep all follow-up appointments as directed by your caregiver.  Do not wear high heels or shoes that do not provide proper support.  Check your mattress to see if it is too soft. A firm mattress may lessen your pain and discomfort. SEEK IMMEDIATE MEDICAL CARE IF:   You lose control of your bowel or bladder (incontinence).  You have increasing weakness in the lower back, pelvis, buttocks, or  legs.  You have redness or swelling of your back.  You have a burning sensation when you urinate.  You have pain that gets worse when you lie down or awakens you at night.  Your pain is worse than you have experienced in the past.  Your pain is lasting longer than 4 weeks.  You are suddenly losing weight without reason. MAKE SURE YOU:  Understand these instructions.  Will watch your condition.  Will get help right away if you are not doing well or get worse.   This information is not intended to replace advice given to you by your health care provider. Make sure you discuss any questions you have with your health care provider.   Document Released: 02/06/2001 Document Revised: 11/03/2014 Document Reviewed: 06/24/2011 Elsevier Interactive Patient Education [employer].

## 2015-05-23 NOTE — Progress Notes (Signed)
   HPI  Patient presents today here with left buttock and leg pain.  Patient explains that over the last 5 days he's developed severe left buttock pain that radiates down his posterior left leg to his left foot. He states that he works all day without any problems with the pain, however after he sits down at night to relax the pain comes on and keeps him awake all night. He's tried Percocet, Tylenol, heat, ibuprofen, and massage with no improvement. He's tried 10 mg Percocet with improvement.  He states that he is only sleeping a couple hours at a time, he's tried several different physicians in places to sleep including the floor and including with his legs up on the couch while lying on the floor with no improvement.  PMH: Smoking status noted ROS: Per HPI  Objective: BP 144/79 mmHg  Pulse 84  Temp(Src) 97 F (36.1 C) (Oral)  Ht 6' 1.5" (1.867 m)  Wt 233 lb 12.8 oz (106.051 kg)  BMI 30.42 kg/m2 Gen: NAD, alert, cooperative with exam HEENT: NCAT CV: RRR, good S1/S2, no murmur Resp: CTABL, no wheezes, non-labored Ext: No edema, warm Neuro: Alert and oriented, No gross deficits  MSK: No tenderness to palpation of lumbar spine, paraspinal muscles, or SI joints Negative straight leg raise   Assessment and plan:  # Radicular pain, , Sciatica vs piriformis syndrome Discussed usual course of both syndromes He's used 10 mg of oxycodone with no improvement, I tried a trial of gabapentin to see if that will help his neuropathic pain. Given a handout with piriformis syndrome stretching in it, it's from the sports medicine patient advisor. 300 mg gabapentin on night 1, increase by 100 mg every 3 nights until he reaches 600 mg, hopefully this will give him sleep and pain relief. Discussed low threshold for follow-up with orthopedics    Meds ordered this encounter  Medications  . oxyCODONE-acetaminophen (ROXICET) 5-325 MG/5ML solution    Sig: Take by mouth every 4 (four) hours as  needed for severe pain.  Marland Kitchen gabapentin (NEURONTIN) 100 MG capsule    Sig: Take 3-6 capsules (300-600 mg total) by mouth at bedtime.    Dispense:  90 capsule    Refill:  [place], MD [facility] 05/23/2015, 5:03 PM

## 2015-06-17 DIAGNOSIS — R1313 Dysphagia, pharyngeal phase: Secondary | ICD-10-CM | POA: Diagnosis not present

## 2015-06-24 ENCOUNTER — Other Ambulatory Visit (HOSPITAL_COMMUNITY): Payer: Self-pay | Admitting: Physician Assistant

## 2015-06-24 DIAGNOSIS — R1310 Dysphagia, unspecified: Secondary | ICD-10-CM

## 2015-07-01 ENCOUNTER — Ambulatory Visit (HOSPITAL_COMMUNITY)
Admission: RE | Admit: 2015-07-01 | Discharge: 2015-07-01 | Disposition: A | Discharge status: Home / Self Care | Payer: BLUE CROSS/BLUE SHIELD | Source: Ambulatory Visit | Attending: Physician Assistant | Admitting: Physician Assistant

## 2015-07-01 DIAGNOSIS — E785 Hyperlipidemia, unspecified: Secondary | ICD-10-CM | POA: Diagnosis not present

## 2015-07-01 DIAGNOSIS — I10 Essential (primary) hypertension: Secondary | ICD-10-CM | POA: Insufficient documentation

## 2015-07-01 DIAGNOSIS — R1310 Dysphagia, unspecified: Secondary | ICD-10-CM | POA: Diagnosis not present

## 2015-07-01 DIAGNOSIS — R1313 Dysphagia, pharyngeal phase: Secondary | ICD-10-CM | POA: Insufficient documentation

## 2015-07-01 DIAGNOSIS — E119 Type 2 diabetes mellitus without complications: Secondary | ICD-10-CM | POA: Insufficient documentation

## 2015-08-19 DIAGNOSIS — M25562 Pain in left knee: Secondary | ICD-10-CM | POA: Diagnosis not present

## 2015-08-20 ENCOUNTER — Other Ambulatory Visit: Payer: Self-pay | Admitting: Family

## 2015-10-30 ENCOUNTER — Other Ambulatory Visit: Payer: Self-pay | Admitting: Family

## 2015-10-30 DIAGNOSIS — E119 Type 2 diabetes mellitus without complications: Secondary | ICD-10-CM

## 2015-11-18 ENCOUNTER — Ambulatory Visit (INDEPENDENT_AMBULATORY_CARE_PROVIDER_SITE_OTHER): Payer: BLUE CROSS/BLUE SHIELD | Admitting: Family

## 2015-11-18 ENCOUNTER — Encounter: Payer: Self-pay | Admitting: Family

## 2015-11-18 VITALS — BP 133/84 | HR 72 | Temp 97.5°F | Ht 73.5 in | Wt 235.0 lb

## 2015-11-18 DIAGNOSIS — E669 Obesity, unspecified: Secondary | ICD-10-CM | POA: Diagnosis not present

## 2015-11-18 DIAGNOSIS — E119 Type 2 diabetes mellitus without complications: Secondary | ICD-10-CM | POA: Diagnosis not present

## 2015-11-18 DIAGNOSIS — E785 Hyperlipidemia, unspecified: Secondary | ICD-10-CM | POA: Diagnosis not present

## 2015-11-18 DIAGNOSIS — E663 Overweight: Secondary | ICD-10-CM | POA: Insufficient documentation

## 2015-11-18 DIAGNOSIS — I10 Essential (primary) hypertension: Secondary | ICD-10-CM

## 2015-11-18 DIAGNOSIS — Z23 Encounter for immunization: Secondary | ICD-10-CM | POA: Diagnosis not present

## 2015-11-18 LAB — BAYER DCA HB A1C WAIVED: HB A1C (BAYER DCA - WAIVED): 8.1 % — ABNORMAL HIGH (ref <7.0)

## 2015-11-18 NOTE — Progress Notes (Signed)
Subjective:    Patient ID: Jason Rogers, male    DOB: May 31, 1955, 60 y.o.   MRN: [id number]  Pt presents to the office today for chronic follow up. Diabetes  He presents for his follow-up diabetic visit. He has type 2 diabetes mellitus. His disease course has been stable. Hypoglycemia symptoms include headaches. Pertinent negatives for hypoglycemia include no confusion. Pertinent negatives for diabetes include no blurred vision, no foot paresthesias, no foot ulcerations and no visual change. Pertinent negatives for hypoglycemia complications include no blackouts and no hospitalization. Symptoms are stable. Pertinent negatives for diabetic complications include no CVA, heart disease, nephropathy or peripheral neuropathy. Risk factors for coronary artery disease include diabetes mellitus, dyslipidemia, family history, hypertension and male sex. Current diabetic treatment includes diet and oral agent (dual therapy). He is compliant with treatment all of the time. His weight is stable. He is following a generally healthy diet. (Pt does not take blood glucose ) An ACE inhibitor/angiotensin II receptor blocker is being taken. Eye exam is current (01/27/15).  Hypertension  This is a chronic problem. The current episode started more than 1 year ago. The problem has been resolved since onset. The problem is controlled. Associated symptoms include headaches. Pertinent negatives include no blurred vision, palpitations or peripheral edema. Risk factors for coronary artery disease include diabetes mellitus, dyslipidemia, family history, male gender and sedentary lifestyle. Past treatments include ACE inhibitors and diuretics. There is no history of kidney disease, CAD/MI, CVA, heart failure or a thyroid problem. There is no history of sleep apnea.  Hyperlipidemia  This is a chronic problem. The current episode started more than 1 year ago. The problem is controlled. Recent lipid tests were reviewed and are  normal. Exacerbating diseases include diabetes and obesity. He has no history of hypothyroidism. Pertinent negatives include no leg pain or myalgias. Current antihyperlipidemic treatment includes statins. The current treatment provides significant improvement of lipids. Risk factors for coronary artery disease include diabetes mellitus, dyslipidemia, family history, hypertension, male sex, post-menopausal and a sedentary lifestyle.      Review of Systems  Constitutional: Negative.   Eyes: Negative for blurred vision.  Respiratory: Negative.   Cardiovascular: Negative.  Negative for palpitations.  Gastrointestinal: Negative.   Endocrine: Negative.   Genitourinary: Negative.   Musculoskeletal: Negative.  Negative for myalgias.  Neurological: Positive for headaches.  Hematological: Negative.   Psychiatric/Behavioral: Negative.  Negative for confusion.  All other systems reviewed and are negative.      Objective:   Physical Exam  Constitutional: He is oriented to person, place, and time. He appears well-developed and well-nourished. No distress.  HENT:  Head: Normocephalic.  Right Ear: External ear normal.  Left Ear: External ear normal.  Nose: Nose normal.  Mouth/Throat: Oropharynx is clear and moist.  Eyes: Pupils are equal, round, and reactive to light. Right eye exhibits no discharge. Left eye exhibits no discharge.  Neck: Normal range of motion. Neck supple. No thyromegaly present.  Cardiovascular: Normal rate, regular rhythm, normal heart sounds and intact distal pulses.   No murmur heard. Pulmonary/Chest: Effort normal and breath sounds normal. No respiratory distress. He has no wheezes.  Abdominal: Soft. Bowel sounds are normal. He exhibits no distension. There is no tenderness.  Musculoskeletal: Normal range of motion. He exhibits no edema or tenderness.  Neurological: He is alert and oriented to person, place, and time. He has normal reflexes. No cranial nerve deficit.    Skin: Skin is warm and dry. No rash noted. No erythema.  Psychiatric: He has a normal mood and affect. His behavior is normal. Judgment and thought content normal.  Vitals reviewed.   BP 133/84   Pulse 72   Temp 97.5 F (36.4 C) (Oral)   Ht 6' 1.5" (1.867 m)   Wt 235 lb (106.6 kg)   BMI 30.58 kg/m       Assessment & Plan:  1. Essential hypertension - CMP14+EGFR  2. Type 2 diabetes mellitus without complication, without long-term current use of insulin (HCC) - CMP14+EGFR - Bayer DCA Hb A1c Waived - Microalbumin / creatinine urine ratio  3. HLD (hyperlipidemia) - CMP14+EGFR - Lipid panel  4. Obesity (BMI 30-39.9)   Continue all meds Labs pending Health Maintenance reviewed Diet and exercise encouraged RTO 6 months  Evelina Dun, FNP

## 2015-11-18 NOTE — Patient Instructions (Signed)
Varicella-Zoster Virus Vaccine Live injection  What is this medicine?  VARICELLA VIRUS VACCINE (var uh SEL uh VAHY ruhs vak SEEN) is used to prevent infections of chickenpox.  HERPES ZOSTER VIRUS VACCINE (HUR peez ZOS ter vahy ruhs vak SEEN) is used to prevent shingles in adults 61 years old and over. This vaccine is not used to treat shingles or nerve pain from shingles.  These medicines may be used for other purposes; ask your health care provider or pharmacist if you have questions.  This medicine may be used for other purposes; ask your health care provider or pharmacist if you have questions.  What should I tell my health care provider before I take this medicine?  They need to know if you have any of the following conditions:  -blood disorders or disease  -cancer like leukemia or lymphoma  -immune system problems or therapy  -infection with fever  -recent immune globulin therapy  -tuberculosis  -an unusual or allergic reaction to vaccines, neomycin, gelatin, other medicines, foods, dyes, or preservatives  -pregnant or trying to get pregnant  -breast-feeding  How should I use this medicine?  These vaccines are for injection under the skin. They are given by a health care professional.  A copy of Vaccine Information Statements will be given before each varicella virus vaccination. Read this sheet carefully each time. The sheet may change frequently. A Vaccine Information Statement is not given before the herpes zoster virus vaccine.  Talk to your pediatrician regarding the use of the varicella virus vaccine in children. While this drug may be prescribed for children as young as 12 months of age for selected conditions, precautions do apply. The herpes zoster virus vaccine is not approved in children.  Overdosage: If you think you have taken too much of this medicine contact a poison control center or emergency room at once.  NOTE: This medicine is only for you. Do not share this medicine with others.  What if I  miss a dose?  Keep appointments for follow-up (booster) doses of varicella virus vaccine as directed. It is important not to miss your dose. Call your doctor or health care professional if you are unable to keep an appointment.  Follow-up (booster) doses are not needed for the herpes zoster virus vaccine.  What may interact with this medicine?  Do not take these medicines with any of the following medications:  -adalimumab  -anakinra  -etanercept  -infliximab  -medicines that suppress your immune system  -medicines to treat cancer  These medicines may also interact with the following medications:  -aspirin and aspirin-like medicines (varicella virus vaccine only)  -blood transfusions (varicella virus vaccine only)  -immunoglobulins (varicella virus vaccine only)  -steroid medicines like prednisone or cortisone  This list may not describe all possible interactions. Give your health care provider a list of all the medicines, herbs, non-prescription drugs, or dietary supplements you use. Also tell them if you smoke, drink alcohol, or use illegal drugs. Some items may interact with your medicine.  What should I watch for while using this medicine?  Visit your doctor for regular check ups.  These vaccines, like all vaccines, may not fully protect everyone.  After receiving these vaccines it may be possible to pass chickenpox infection to others. For up to 6 weeks, avoid people with immune system problems, pregnant women who have not had chickenpox, newborns of women who have not had chickenpox, and all newborns born at less than 28 weeks of pregnancy. Talk to your   doctor for more information.  Do not become pregnant for 3 months after taking these vaccines. Women should inform their doctor if they wish to become pregnant or think they might be pregnant. There is a potential for serious side effects to an unborn child. Talk to your health care professional or pharmacist for more information.  What side effects may I  notice from receiving this medicine?  Side effects that you should report to your doctor or health care professional as soon as possible:  -allergic reactions like skin rash, itching or hives, swelling of the face, lips, or tongue  -breathing problems  -extreme changes in behavior  -feeling faint or lightheaded, falls  -fever over 102 degrees F  -pain, tingling, numbness in the hands or feet  -redness, blistering, peeling or loosening of the skin, including inside the mouth  -seizures  -unusually weak or tired  Side effects that usually do not require medical attention (report to your doctor or health care professional if they continue or are bothersome):  -aches or pains  -chickenpox-like rash  -diarrhea  -headache  -low-grade fever under 102 degrees F  -loss of appetite  -nausea, vomiting  -redness, pain, swelling at site where injected  -sleepy  -trouble sleeping  This list may not describe all possible side effects. Call your doctor for medical advice about side effects. You may report side effects to FDA at 1-800-FDA-1088.  Where should I keep my medicine?  These drugs are given in a hospital or clinic and will not be stored at home.  NOTE: This sheet is a summary. It may not cover all possible information. If you have questions about this medicine, talk to your doctor, pharmacist, or health care provider.     © 2016, [employer]vier/[employer]. (2012-10-17 14:24:35)

## 2015-11-19 LAB — MICROALBUMIN / CREATININE URINE RATIO
Creatinine, Urine: 75.4 mg/dL
MICROALB/CREAT RATIO: <4 mg/g creat (ref 0.0–30.0)
Microalbumin, Urine: <3 ug/mL

## 2015-11-19 LAB — CMP14+EGFR
ALT: 30 IU/L (ref 0–44)
AST: 20 IU/L (ref 0–40)
Albumin/Globulin Ratio: 1.7 (ref 1.2–2.2)
Albumin: 4.2 g/dL (ref 3.6–4.8)
Alkaline Phosphatase: 54 IU/L (ref 39–117)
BUN/Creatinine Ratio: 20 (ref 10–24)
BUN: 20 mg/dL (ref 8–27)
Bilirubin Total: 1 mg/dL (ref 0.0–1.2)
CALCIUM: 9.4 mg/dL (ref 8.6–10.2)
CO2: 24 mmol/L (ref 18–29)
CREATININE: 0.98 mg/dL (ref 0.76–1.27)
Chloride: 100 mmol/L (ref 96–106)
GFR calc Af Amer: 96 mL/min/{1.73_m2} (ref >59)
GFR, EST NON AFRICAN AMERICAN: 83 mL/min/{1.73_m2} (ref >59)
GLOBULIN, TOTAL: 2.5 g/dL (ref 1.5–4.5)
Glucose: 159 mg/dL — ABNORMAL HIGH (ref 65–99)
Potassium: 4.5 mmol/L (ref 3.5–5.2)
SODIUM: 139 mmol/L (ref 134–144)
Total Protein: 6.7 g/dL (ref 6.0–8.5)

## 2015-11-19 LAB — LIPID PANEL
CHOL/HDL RATIO: 4 ratio (ref 0.0–5.0)
Cholesterol, Total: 164 mg/dL (ref 100–199)
HDL: 41 mg/dL (ref >39)
LDL CALC: 81 mg/dL (ref 0–99)
TRIGLYCERIDES: 208 mg/dL — AB (ref 0–149)
VLDL Cholesterol Cal: 42 mg/dL — ABNORMAL HIGH (ref 5–40)

## 2015-11-22 ENCOUNTER — Other Ambulatory Visit: Payer: Self-pay | Admitting: Family

## 2015-11-22 MED ORDER — DULAGLUTIDE 0.75 MG/0.5ML ~~LOC~~ SOAJ: 0.7500 mg | SUBCUTANEOUS | 3 refills | Status: DC

## 2015-11-23 ENCOUNTER — Other Ambulatory Visit: Payer: Self-pay

## 2015-11-23 DIAGNOSIS — E785 Hyperlipidemia, unspecified: Secondary | ICD-10-CM

## 2015-11-23 DIAGNOSIS — I10 Essential (primary) hypertension: Secondary | ICD-10-CM

## 2015-11-23 DIAGNOSIS — E119 Type 2 diabetes mellitus without complications: Secondary | ICD-10-CM

## 2015-11-23 MED ORDER — SIMVASTATIN 10 MG PO TABS: 10.0000 mg | ORAL_TABLET | Freq: Every day | ORAL | 1 refills | Status: DC

## 2015-11-23 MED ORDER — SITAGLIPTIN PHOS-METFORMIN HCL 50-1000 MG PO TABS: 1.0000 | ORAL_TABLET | Freq: Two times a day (BID) | ORAL | 1 refills | Status: DC

## 2015-11-23 MED ORDER — OMEPRAZOLE 20 MG PO CPDR: 20.0000 mg | DELAYED_RELEASE_CAPSULE | Freq: Every day | ORAL | 1 refills | Status: DC

## 2015-11-23 MED ORDER — CANAGLIFLOZIN 300 MG PO TABS: 300.0000 mg | ORAL_TABLET | Freq: Every day | ORAL | 1 refills | Status: DC

## 2015-11-23 MED ORDER — GABAPENTIN 100 MG PO CAPS: 300.0000 mg | ORAL_CAPSULE | Freq: Every day | ORAL | 1 refills | Status: DC

## 2015-11-23 MED ORDER — HYDROCHLOROTHIAZIDE 12.5 MG PO CAPS: 12.5000 mg | ORAL_CAPSULE | Freq: Every day | ORAL | 1 refills | Status: DC

## 2015-11-23 MED ORDER — LISINOPRIL 20 MG PO TABS: 20.0000 mg | ORAL_TABLET | Freq: Every day | ORAL | 1 refills | Status: DC

## 2016-03-15 ENCOUNTER — Telehealth: Payer: Self-pay | Admitting: Pharmacist

## 2016-03-15 NOTE — Telephone Encounter (Signed)
Called to check on patient regarding HBG reading and diabetes.   Unable to reach but left message on VM at home.

## 2016-03-30 DIAGNOSIS — E119 Type 2 diabetes mellitus without complications: Secondary | ICD-10-CM | POA: Diagnosis not present

## 2016-03-30 LAB — HM DIABETES EYE EXAM

## 2016-05-19 ENCOUNTER — Other Ambulatory Visit: Payer: Self-pay | Admitting: Family

## 2016-05-19 DIAGNOSIS — E785 Hyperlipidemia, unspecified: Secondary | ICD-10-CM

## 2016-05-21 ENCOUNTER — Other Ambulatory Visit: Payer: Self-pay | Admitting: *Deleted

## 2016-05-21 DIAGNOSIS — E785 Hyperlipidemia, unspecified: Secondary | ICD-10-CM

## 2016-05-21 MED ORDER — SIMVASTATIN 10 MG PO TABS: 10.0000 mg | ORAL_TABLET | Freq: Every day | ORAL | 0 refills | Status: DC

## 2016-05-21 MED ORDER — OMEPRAZOLE 20 MG PO CPDR: 20.0000 mg | DELAYED_RELEASE_CAPSULE | Freq: Every day | ORAL | 1 refills | Status: DC

## 2016-05-24 ENCOUNTER — Ambulatory Visit: Payer: BLUE CROSS/BLUE SHIELD | Admitting: Family

## 2016-05-25 ENCOUNTER — Ambulatory Visit: Payer: BLUE CROSS/BLUE SHIELD | Admitting: Family

## 2016-06-01 ENCOUNTER — Encounter: Payer: Self-pay | Admitting: Family

## 2016-06-01 ENCOUNTER — Ambulatory Visit (INDEPENDENT_AMBULATORY_CARE_PROVIDER_SITE_OTHER): Payer: BLUE CROSS/BLUE SHIELD | Admitting: Family

## 2016-06-01 VITALS — BP 123/83 | HR 72 | Temp 97.3°F | Ht 73.5 in | Wt 228.4 lb

## 2016-06-01 DIAGNOSIS — E119 Type 2 diabetes mellitus without complications: Secondary | ICD-10-CM

## 2016-06-01 DIAGNOSIS — E669 Obesity, unspecified: Secondary | ICD-10-CM | POA: Diagnosis not present

## 2016-06-01 DIAGNOSIS — E785 Hyperlipidemia, unspecified: Secondary | ICD-10-CM

## 2016-06-01 DIAGNOSIS — I10 Essential (primary) hypertension: Secondary | ICD-10-CM | POA: Diagnosis not present

## 2016-06-01 LAB — LIPID PANEL
CHOL/HDL RATIO: 3.6 ratio (ref 0.0–5.0)
Cholesterol, Total: 154 mg/dL (ref 100–199)
HDL: 43 mg/dL (ref >39)
LDL CALC: 78 mg/dL (ref 0–99)
TRIGLYCERIDES: 166 mg/dL — AB (ref 0–149)
VLDL CHOLESTEROL CAL: 33 mg/dL (ref 5–40)

## 2016-06-01 LAB — CMP14+EGFR
ALBUMIN: 4.4 g/dL (ref 3.6–4.8)
ALT: 19 IU/L (ref 0–44)
AST: 19 IU/L (ref 0–40)
Albumin/Globulin Ratio: 1.8 (ref 1.2–2.2)
Alkaline Phosphatase: 55 IU/L (ref 39–117)
BUN / CREAT RATIO: 24 (ref 10–24)
BUN: 24 mg/dL (ref 8–27)
Bilirubin Total: 0.8 mg/dL (ref 0.0–1.2)
CALCIUM: 9.7 mg/dL (ref 8.6–10.2)
CO2: 24 mmol/L (ref 18–29)
CREATININE: 0.98 mg/dL (ref 0.76–1.27)
Chloride: 100 mmol/L (ref 96–106)
GFR calc Af Amer: 95 mL/min/{1.73_m2} (ref >59)
GFR, EST NON AFRICAN AMERICAN: 82 mL/min/{1.73_m2} (ref >59)
GLOBULIN, TOTAL: 2.5 g/dL (ref 1.5–4.5)
Glucose: 130 mg/dL — ABNORMAL HIGH (ref 65–99)
Potassium: 4.9 mmol/L (ref 3.5–5.2)
SODIUM: 140 mmol/L (ref 134–144)
Total Protein: 6.9 g/dL (ref 6.0–8.5)

## 2016-06-01 LAB — BAYER DCA HB A1C WAIVED: HB A1C (BAYER DCA - WAIVED): 7.2 % — ABNORMAL HIGH (ref <7.0)

## 2016-06-01 NOTE — Patient Instructions (Signed)
Diabetes Mellitus and Food It is important for you to manage your blood sugar (glucose) level. Your blood glucose level can be greatly affected by what you eat. Eating healthier foods in the appropriate amounts throughout the day at about the same time each day will help you control your blood glucose level. It can also help slow or prevent worsening of your diabetes mellitus. Healthy eating may even help you improve the level of your blood pressure and reach or maintain a healthy weight. General recommendations for healthful eating and cooking habits include:  Eating meals and snacks regularly. Avoid going long periods of time without eating to lose weight.  Eating a diet that consists mainly of plant-based foods, such as fruits, vegetables, nuts, legumes, and whole grains.  Using low-heat cooking methods, such as baking, instead of high-heat cooking methods, such as deep frying.  Work with your dietitian to make sure you understand how to use the Nutrition Facts information on food labels. How can food affect me? Carbohydrates Carbohydrates affect your blood glucose level more than any other type of food. Your dietitian will help you determine how many carbohydrates to eat at each meal and teach you how to count carbohydrates. Counting carbohydrates is important to keep your blood glucose at a healthy level, especially if you are using insulin or taking certain medicines for diabetes mellitus. Alcohol Alcohol can cause sudden decreases in blood glucose (hypoglycemia), especially if you use insulin or take certain medicines for diabetes mellitus. Hypoglycemia can be a life-threatening condition. Symptoms of hypoglycemia (sleepiness, dizziness, and disorientation) are similar to symptoms of having too much alcohol. If your health care provider has given you approval to drink alcohol, do so in moderation and use the following guidelines:  Women should not have more than one drink per day, and men  should not have more than two drinks per day. One drink is equal to: ? 12 oz of beer. ? 5 oz of wine. ? 1 oz of hard liquor.  Do not drink on an empty stomach.  Keep yourself hydrated. Have water, diet soda, or unsweetened iced tea.  Regular soda, juice, and other mixers might contain a lot of carbohydrates and should be counted.  What foods are not recommended? As you make food choices, it is important to remember that all foods are not the same. Some foods have fewer nutrients per serving than other foods, even though they might have the same number of calories or carbohydrates. It is difficult to get your body what it needs when you eat foods with fewer nutrients. Examples of foods that you should avoid that are high in calories and carbohydrates but low in nutrients include:  Trans fats (most processed foods list trans fats on the Nutrition Facts label).  Regular soda.  Juice.  Candy.  Sweets, such as cake, pie, doughnuts, and cookies.  Fried foods.  What foods can I eat? Eat nutrient-rich foods, which will nourish your body and keep you healthy. The food you should eat also will depend on several factors, including:  The calories you need.  The medicines you take.  Your weight.  Your blood glucose level.  Your blood pressure level.  Your cholesterol level.  You should eat a variety of foods, including:  Protein. ? Lean cuts of meat. ? Proteins low in saturated fats, such as fish, egg whites, and beans. Avoid processed meats.  Fruits and vegetables. ? Fruits and vegetables that may help control blood glucose levels, such as apples,   mangoes, and yams.  Dairy products. ? Choose fat-free or low-fat dairy products, such as milk, yogurt, and cheese.  Grains, bread, pasta, and rice. ? Choose whole grain products, such as multigrain bread, whole oats, and brown rice. These foods may help control blood pressure.  Fats. ? Foods containing healthful fats, such as  nuts, avocado, olive oil, canola oil, and fish.  Does everyone with diabetes mellitus have the same meal plan? Because every person with diabetes mellitus is different, there is not one meal plan that works for everyone. It is very important that you meet with a dietitian who will help you create a meal plan that is just right for you. This information is not intended to replace advice given to you by your health care provider. Make sure you discuss any questions you have with your health care provider. Document Released: 11/09/2004 Document Revised: 07/21/2015 Document Reviewed: 01/09/2013 Elsevier Interactive Patient Education  2017 [employer] Inc.  

## 2016-06-01 NOTE — Progress Notes (Signed)
Subjective:    Patient ID: Jason Rogers, male    DOB: [birth year], 61 y.o.   MRN: [id number]  Pt presents to the office today for chronic follow up. Diabetes  He presents for his follow-up diabetic visit. He has type 2 diabetes mellitus. His disease course has been stable. Pertinent negatives for hypoglycemia include no confusion or headaches. Pertinent negatives for diabetes include no blurred vision, no foot paresthesias, no foot ulcerations and no visual change. Pertinent negatives for hypoglycemia complications include no blackouts and no hospitalization. Symptoms are stable. Pertinent negatives for diabetic complications include no CVA, heart disease, nephropathy or peripheral neuropathy. Risk factors for coronary artery disease include diabetes mellitus, dyslipidemia, family history, hypertension and male sex. Current diabetic treatment includes diet and oral agent (dual therapy). He is compliant with treatment all of the time. His weight is stable. He is following a generally healthy diet. (Pt does not take blood regularly  glucose ) An ACE inhibitor/angiotensin II receptor blocker is being taken. Eye exam is current (01/27/15).  Hypertension  This is a chronic problem. The current episode started more than 1 year ago. The problem has been resolved since onset. The problem is controlled. Pertinent negatives include no blurred vision, headaches, palpitations or peripheral edema. Risk factors for coronary artery disease include diabetes mellitus, dyslipidemia, family history, male gender and sedentary lifestyle. Past treatments include ACE inhibitors and diuretics. There is no history of kidney disease, CAD/MI, CVA or heart failure. There is no history of sleep apnea or a thyroid problem.  Hyperlipidemia  This is a chronic problem. The current episode started more than 1 year ago. The problem is controlled. Recent lipid tests were reviewed and are normal. Exacerbating diseases include diabetes  and obesity. He has no history of hypothyroidism. Pertinent negatives include no leg pain or myalgias. Current antihyperlipidemic treatment includes statins. The current treatment provides significant improvement of lipids. Risk factors for coronary artery disease include diabetes mellitus, dyslipidemia, family history, hypertension, male sex, post-menopausal and a sedentary lifestyle.      Review of Systems  Constitutional: Negative.   Eyes: Negative for blurred vision.  Respiratory: Negative.   Cardiovascular: Negative.  Negative for palpitations.  Gastrointestinal: Negative.   Endocrine: Negative.   Genitourinary: Negative.   Musculoskeletal: Negative.  Negative for myalgias.  Neurological: Negative for headaches.  Hematological: Negative.   Psychiatric/Behavioral: Negative.  Negative for confusion.  All other systems reviewed and are negative.      Objective:   Physical Exam  Constitutional: He is oriented to person, place, and time. He appears well-developed and well-nourished. No distress.  HENT:  Head: Normocephalic.  Right Ear: External ear normal.  Left Ear: External ear normal.  Nose: Nose normal.  Mouth/Throat: Oropharynx is clear and moist.  Eyes: Pupils are equal, round, and reactive to light. Right eye exhibits no discharge. Left eye exhibits no discharge.  Neck: Normal range of motion. Neck supple. No thyromegaly present.  Cardiovascular: Normal rate, regular rhythm, normal heart sounds and intact distal pulses.   No murmur heard. Pulmonary/Chest: Effort normal and breath sounds normal. No respiratory distress. He has no wheezes.  Abdominal: Soft. Bowel sounds are normal. He exhibits no distension. There is no tenderness.  Musculoskeletal: Normal range of motion. He exhibits no edema or tenderness.  Neurological: He is alert and oriented to person, place, and time. He has normal reflexes. No cranial nerve deficit.  Skin: Skin is warm and dry. No rash noted. No  erythema.  Psychiatric: He  has a normal mood and affect. His behavior is normal. Judgment and thought content normal.  Vitals reviewed.   BP 123/83   Pulse 72   Temp 97.3 F (36.3 C) (Oral)   Ht 6' 1.5" (1.867 m)   Wt 228 lb 6.4 oz (103.6 kg)   BMI 29.73 kg/m       Assessment & Plan:  1. Type 2 diabetes mellitus without complication, without long-term current use of insulin (HCC) - CMP14+EGFR - Bayer DCA Hb A1c Waived  2. Obesity (BMI 30-39.9) - CMP14+EGFR  3. Hyperlipidemia, unspecified hyperlipidemia type - CMP14+EGFR - Lipid panel  4. Essential hypertension - CMP14+EGFR   Continue all meds Labs pending Health Maintenance reviewed Diet and exercise encouraged RTO 6 months   Evelina Dun, FNP

## 2016-06-04 ENCOUNTER — Other Ambulatory Visit: Payer: Self-pay | Admitting: Family

## 2016-06-04 MED ORDER — DULAGLUTIDE 1.5 MG/0.5ML ~~LOC~~ SOAJ: 1.5000 mg | SUBCUTANEOUS | 3 refills | Status: DC

## 2016-06-13 ENCOUNTER — Other Ambulatory Visit: Payer: Self-pay | Admitting: Family

## 2016-06-13 DIAGNOSIS — E785 Hyperlipidemia, unspecified: Secondary | ICD-10-CM

## 2016-07-01 ENCOUNTER — Other Ambulatory Visit: Payer: Self-pay | Admitting: Family

## 2016-07-01 DIAGNOSIS — I10 Essential (primary) hypertension: Secondary | ICD-10-CM

## 2016-07-13 ENCOUNTER — Other Ambulatory Visit: Payer: Self-pay | Admitting: Family

## 2016-07-13 DIAGNOSIS — E119 Type 2 diabetes mellitus without complications: Secondary | ICD-10-CM

## 2016-08-14 ENCOUNTER — Other Ambulatory Visit: Payer: Self-pay | Admitting: Family

## 2016-08-14 DIAGNOSIS — E119 Type 2 diabetes mellitus without complications: Secondary | ICD-10-CM

## 2016-10-06 ENCOUNTER — Other Ambulatory Visit: Payer: Self-pay | Admitting: Family

## 2016-10-06 DIAGNOSIS — E119 Type 2 diabetes mellitus without complications: Secondary | ICD-10-CM

## 2016-11-18 ENCOUNTER — Other Ambulatory Visit: Payer: Self-pay | Admitting: Family

## 2016-12-14 ENCOUNTER — Ambulatory Visit: Payer: BLUE CROSS/BLUE SHIELD | Admitting: Family

## 2016-12-16 ENCOUNTER — Other Ambulatory Visit: Payer: Self-pay | Admitting: Family

## 2016-12-16 DIAGNOSIS — E785 Hyperlipidemia, unspecified: Secondary | ICD-10-CM

## 2016-12-28 ENCOUNTER — Encounter: Payer: Self-pay | Admitting: Family

## 2016-12-28 ENCOUNTER — Ambulatory Visit (INDEPENDENT_AMBULATORY_CARE_PROVIDER_SITE_OTHER): Payer: BLUE CROSS/BLUE SHIELD | Admitting: Family

## 2016-12-28 VITALS — BP 118/73 | HR 67 | Temp 97.2°F | Ht 73.5 in | Wt 226.2 lb

## 2016-12-28 DIAGNOSIS — E1159 Type 2 diabetes mellitus with other circulatory complications: Secondary | ICD-10-CM

## 2016-12-28 DIAGNOSIS — I10 Essential (primary) hypertension: Secondary | ICD-10-CM | POA: Diagnosis not present

## 2016-12-28 DIAGNOSIS — B351 Tinea unguium: Secondary | ICD-10-CM

## 2016-12-28 DIAGNOSIS — K219 Gastro-esophageal reflux disease without esophagitis: Secondary | ICD-10-CM | POA: Diagnosis not present

## 2016-12-28 DIAGNOSIS — I152 Hypertension secondary to endocrine disorders: Secondary | ICD-10-CM

## 2016-12-28 DIAGNOSIS — Z1159 Encounter for screening for other viral diseases: Secondary | ICD-10-CM

## 2016-12-28 DIAGNOSIS — E785 Hyperlipidemia, unspecified: Secondary | ICD-10-CM | POA: Diagnosis not present

## 2016-12-28 DIAGNOSIS — E1169 Type 2 diabetes mellitus with other specified complication: Secondary | ICD-10-CM | POA: Diagnosis not present

## 2016-12-28 DIAGNOSIS — E663 Overweight: Secondary | ICD-10-CM | POA: Diagnosis not present

## 2016-12-28 DIAGNOSIS — R252 Cramp and spasm: Secondary | ICD-10-CM | POA: Diagnosis not present

## 2016-12-28 DIAGNOSIS — Z23 Encounter for immunization: Secondary | ICD-10-CM

## 2016-12-28 DIAGNOSIS — E1165 Type 2 diabetes mellitus with hyperglycemia: Secondary | ICD-10-CM

## 2016-12-28 LAB — BAYER DCA HB A1C WAIVED: HB A1C: 7.4 % — AB (ref <7.0)

## 2016-12-28 MED ORDER — TERBINAFINE HCL 250 MG PO TABS: 250.0000 mg | ORAL_TABLET | Freq: Every day | ORAL | 0 refills | Status: DC

## 2016-12-28 NOTE — Progress Notes (Signed)
Subjective:    Patient ID: Jason Rogers, male    DOB: August 03, 1955, 61 y.o.   MRN: [id number]  Pt presents to the office today for chronic follow up.  Diabetes  He presents for his follow-up diabetic visit. He has type 2 diabetes mellitus. His disease course has been stable. There are no hypoglycemic associated symptoms. Pertinent negatives for diabetes include no blurred vision, no chest pain, no foot paresthesias and no visual change. There are no hypoglycemic complications. Symptoms are stable. There are no diabetic complications. Pertinent negatives for diabetic complications include no CVA, heart disease, nephropathy or peripheral neuropathy. Risk factors for coronary artery disease include diabetes mellitus, dyslipidemia, male sex, obesity, hypertension and sedentary lifestyle. He is following a generally healthy diet. (Does not check blood glucose) Eye exam is current.  Hyperlipidemia  This is a chronic problem. The current episode started more than 1 year ago. The problem is controlled. Recent lipid tests were reviewed and are normal. Exacerbating diseases include obesity. Pertinent negatives include no chest pain or shortness of breath. Current antihyperlipidemic treatment includes statins. The current treatment provides moderate improvement of lipids. Risk factors for coronary artery disease include male sex, obesity, hypertension, dyslipidemia and diabetes mellitus.  Hypertension  This is a chronic problem. The current episode started more than 1 year ago. The problem has been resolved since onset. The problem is controlled. Pertinent negatives include no blurred vision, chest pain, malaise/fatigue, peripheral edema or shortness of breath. Risk factors for coronary artery disease include diabetes mellitus, dyslipidemia, obesity and sedentary lifestyle. The current treatment provides moderate improvement. There is no history of kidney disease, CAD/MI or CVA.  Gastroesophageal Reflux  He  reports no chest pain, no coughing or no heartburn. This is a chronic problem. The current episode started more than 1 year ago. The problem occurs occasionally. The problem has been unchanged. The symptoms are aggravated by certain foods. Risk factors include obesity. He has tried a PPI for the symptoms. The treatment provided moderate relief.      Review of Systems  Constitutional: Negative for malaise/fatigue.  Eyes: Negative for blurred vision.  Respiratory: Negative for cough and shortness of breath.   Cardiovascular: Negative for chest pain.  Gastrointestinal: Negative for heartburn.  All other systems reviewed and are negative.      Objective:   Physical Exam  Constitutional: He is oriented to person, place, and time. He appears well-developed and well-nourished. No distress.  HENT:  Head: Normocephalic.  Right Ear: External ear normal.  Left Ear: External ear normal.  Nose: Nose normal.  Mouth/Throat: Oropharynx is clear and moist.  Eyes: Pupils are equal, round, and reactive to light. Right eye exhibits no discharge. Left eye exhibits no discharge.  Neck: Normal range of motion. Neck supple. No thyromegaly present.  Cardiovascular: Normal rate, regular rhythm, normal heart sounds and intact distal pulses.   No murmur heard. Pulmonary/Chest: Effort normal and breath sounds normal. No respiratory distress. He has no wheezes.  Abdominal: Soft. Bowel sounds are normal. He exhibits no distension. There is no tenderness.  Musculoskeletal: Normal range of motion. He exhibits no edema or tenderness.  Neurological: He is alert and oriented to person, place, and time.  Skin: Skin is warm and dry. No rash noted. No erythema.  Thick toenails with white discoloration   Psychiatric: He has a normal mood and affect. His behavior is normal. Judgment and thought content normal.  Vitals reviewed.  Diabetic Foot Exam - Simple   Simple Foot  Form Diabetic Foot exam was performed with the  following findings:  Yes 12/28/2016  8:47 AM  Visual Inspection No deformities, no ulcerations, no other skin breakdown bilaterally:  Yes Sensation Testing Intact to touch and monofilament testing bilaterally:  Yes Pulse Check Posterior Tibialis and Dorsalis pulse intact bilaterally:  Yes Comments       BP 118/73   Pulse 67   Temp (!) 97.2 F (36.2 C) (Oral)   Ht 6' 1.5" (1.867 m)   Wt 226 lb 3.2 oz (102.6 kg)   BMI 29.44 kg/m      Assessment & Plan:  1. Hyperlipidemia associated with type 2 diabetes mellitus (HCC) - CMP14+EGFR - Lipid panel  2. Hypertension associated with diabetes ([facility]) -Will Stop HCTZ 12.5 mg to day related to muscle cramps - CMP14+EGFR  3. Type 2 diabetes mellitus with hyperglycemia, without long-term current use of insulin (HCC) - Bayer DCA Hb A1c Waived - CMP14+EGFR - Microalbumin / creatinine urine ratio  4. Overweight (BMI 25.0-29.9) - CMP14+EGFR  5. Gastroesophageal reflux disease, esophagitis presence not specified - CMP14+EGFR  6. Need for hepatitis C screening test - CMP14+EGFR - Hepatitis C antibody  7. Need for immunization against influenza - Flu Vaccine QUAD 36+ mos IM  8. Onychomycosis - terbinafine (LAMISIL) 250 MG tablet; Take 1 tablet (250 mg total) by mouth daily.  Dispense: 90 tablet; Refill: 0  9. Leg cramp Stop HCTZ 12.5 mg Stay hydrated   Continue all meds Labs pending Health Maintenance reviewed Diet and exercise encouraged RTO 6 months  Evelina Dun, FNP

## 2016-12-28 NOTE — Patient Instructions (Addendum)
Leg Cramps Leg cramps occur when a muscle or muscles tighten and you have no control over this tightening (involuntary muscle contraction). Muscle cramps can develop in any muscle, but the most common place is in the calf muscles of the leg. Those cramps can occur during exercise or when you are at rest. Leg cramps are painful, and they may last for a few seconds to a few minutes. Cramps may return several times before they finally stop. Usually, leg cramps are not caused by a serious medical problem. In many cases, the cause is not known. Some common causes include:  Overexertion.  Overuse from repetitive motions, or doing the same thing over and over.  Remaining in a certain position for a long period of time.  Improper preparation, form, or technique while performing a sport or an activity.  Dehydration.  Injury.  Side effects of some medicines.  Abnormally low levels of the salts and ions in your blood (electrolytes), especially potassium and calcium. These levels could be low if you are taking water pills (diuretics) or if you are pregnant.  Follow these instructions at home: Watch your condition for any changes. Taking the following actions may help to lessen any discomfort that you are feeling:  Stay well-hydrated. Drink enough fluid to keep your urine clear or pale yellow.  Try massaging, stretching, and relaxing the affected muscle. Do this for several minutes at a time.  For tight or tense muscles, use a warm towel, heating pad, or hot shower water directed to the affected area.  If you are sore or have pain after a cramp, applying ice to the affected area may relieve discomfort. ? Put ice in a plastic bag. ? Place a towel between your skin and the bag. ? Leave the ice on for 20 minutes, 2-3 times per day.  Avoid strenuous exercise for several days if you have been having frequent leg cramps.  Make sure that your diet includes the essential minerals for your muscles to  work normally.  Take medicines only as directed by your health care provider.  Contact a health care provider if:  Your leg cramps get more severe or more frequent, or they do not improve over time.  Your foot becomes cold, numb, or blue. This information is not intended to replace advice given to you by your health care provider. Make sure you discuss any questions you have with your health care provider. Document Released: 03/22/2004 Document Revised: 07/21/2015 Document Reviewed: 01/20/2014 Elsevier Interactive Patient Education  2018 [place]  WHAT IS IT? An infection that lies within the keratin of your nail plate that is caused by a fungus.  WHY ME? Fungal infections affect all ages, sexes, races, and creeds.  There may be many factors that predispose you to a fungal infection such as age, coexisting medical conditions such as diabetes, or an autoimmune disease; stress, medications, fatigue, genetics, etc.  Bottom line: fungus thrives in a warm, moist environment and your shoes offer such a location.  IS IT CONTAGIOUS? Theoretically, yes.  You do not want to share shoes, nail clippers or files with someone who has fungal toenails.  Walking around barefoot in the same room or sleeping in the same bed is unlikely to transfer the organism.  It is important to realize, however, that fungus can spread easily from one nail to the next on the same foot.  HOW DO WE TREAT THIS?  There are several ways to treat this condition.  Treatment  may depend on many factors such as age, medications, pregnancy, liver and kidney conditions, etc.  It is best to ask your doctor which options are available to you.  1. No treatment.   Unlike many other medical concerns, you can live with this condition.  However for many people this can be a painful condition and may lead to ingrown toenails or a bacterial infection.  It is recommended that you keep the nails cut short to help  reduce the amount of fungal nail. 2. Topical treatment.  These range from herbal remedies to prescription strength nail lacquers.  About 40-50% effective, topicals require twice daily application for approximately 9 to 12 months or until an entirely new nail has grown out.  The most effective topicals are medical grade medications available through physicians offices. 3. Oral antifungal medications.  With an 80-90% cure rate, the most common oral medication requires 3 to 4 months of therapy and stays in your system for a year as the new nail grows out.  Oral antifungal medications do require blood work to make sure it is a safe drug for you.  A liver function panel will be performed prior to starting the medication and after the first month of treatment.  It is important to have the blood work performed to avoid any harmful side effects.  In general, this medication safe but blood work is required. 4. Laser Therapy.  This treatment is performed by applying a specialized laser to the affected nail plate.  This therapy is noninvasive, fast, and non-painful.  It is not covered by insurance and is therefore, out of pocket.  The results have been very good with a 80-95% cure rate.  [facility] is the only practice in the area to offer this therapy. 5. Permanent Nail Avulsion.  Removing the entire nail so that a new nail will not grow back.

## 2016-12-29 LAB — MICROALBUMIN / CREATININE URINE RATIO
Creatinine, Urine: 67.7 mg/dL
Microalbumin, Urine: <3 ug/mL

## 2016-12-29 LAB — CMP14+EGFR
ALBUMIN: 4.3 g/dL (ref 3.6–4.8)
ALK PHOS: 52 IU/L (ref 39–117)
ALT: 20 IU/L (ref 0–44)
AST: 17 IU/L (ref 0–40)
Albumin/Globulin Ratio: 2.3 — ABNORMAL HIGH (ref 1.2–2.2)
BUN / CREAT RATIO: 19 (ref 10–24)
BUN: 21 mg/dL (ref 8–27)
Bilirubin Total: 0.9 mg/dL (ref 0.0–1.2)
CALCIUM: 9.2 mg/dL (ref 8.6–10.2)
CO2: 25 mmol/L (ref 20–29)
CREATININE: 1.12 mg/dL (ref 0.76–1.27)
Chloride: 98 mmol/L (ref 96–106)
GFR calc Af Amer: 81 mL/min/{1.73_m2} (ref >59)
GFR calc non Af Amer: 70 mL/min/{1.73_m2} (ref >59)
GLUCOSE: 117 mg/dL — AB (ref 65–99)
Globulin, Total: 1.9 g/dL (ref 1.5–4.5)
Potassium: 4.8 mmol/L (ref 3.5–5.2)
Sodium: 138 mmol/L (ref 134–144)
Total Protein: 6.2 g/dL (ref 6.0–8.5)

## 2016-12-29 LAB — LIPID PANEL
CHOL/HDL RATIO: 3.9 ratio (ref 0.0–5.0)
Cholesterol, Total: 154 mg/dL (ref 100–199)
HDL: 39 mg/dL — AB (ref >39)
LDL CALC: 85 mg/dL (ref 0–99)
TRIGLYCERIDES: 152 mg/dL — AB (ref 0–149)
VLDL Cholesterol Cal: 30 mg/dL (ref 5–40)

## 2016-12-29 LAB — HEPATITIS C ANTIBODY: Hep C Virus Ab: <0.1 s/co ratio (ref 0.0–0.9)

## 2017-01-04 ENCOUNTER — Ambulatory Visit: Payer: BLUE CROSS/BLUE SHIELD | Admitting: Family

## 2017-01-12 ENCOUNTER — Other Ambulatory Visit: Payer: Self-pay | Admitting: Family

## 2017-01-12 DIAGNOSIS — E785 Hyperlipidemia, unspecified: Secondary | ICD-10-CM

## 2017-01-12 DIAGNOSIS — E119 Type 2 diabetes mellitus without complications: Secondary | ICD-10-CM

## 2017-02-20 ENCOUNTER — Other Ambulatory Visit: Payer: Self-pay | Admitting: Family

## 2017-02-20 ENCOUNTER — Encounter: Payer: Self-pay | Admitting: Family Medicine

## 2017-02-20 ENCOUNTER — Ambulatory Visit: Payer: BLUE CROSS/BLUE SHIELD | Admitting: Family Medicine

## 2017-02-20 VITALS — BP 138/73 | HR 86 | Temp 98.9°F | Ht 73.5 in | Wt 235.0 lb

## 2017-02-20 DIAGNOSIS — J0100 Acute maxillary sinusitis, unspecified: Secondary | ICD-10-CM | POA: Diagnosis not present

## 2017-02-20 DIAGNOSIS — E119 Type 2 diabetes mellitus without complications: Secondary | ICD-10-CM

## 2017-02-20 MED ORDER — AMOXICILLIN-POT CLAVULANATE 875-125 MG PO TABS: 1.0000 | ORAL_TABLET | Freq: Two times a day (BID) | ORAL | 0 refills | Status: DC

## 2017-02-20 MED ORDER — PSEUDOEPHEDRINE-GUAIFENESIN ER 120-1200 MG PO TB12: ORAL_TABLET | ORAL | 0 refills | Status: DC

## 2017-02-26 ENCOUNTER — Encounter: Payer: Self-pay | Admitting: Family Medicine

## 2017-02-26 NOTE — Progress Notes (Signed)
Chief Complaint  Patient presents with  . Cough    pt here today c/o cough x 3 days    HPI  Patient presents today for Patient presents with upper respiratory congestion. Rhinorrhea that is frequently purulent. There is moderate sore throat. Patient reports coughing mild without sputum noted. There is no fever, chills or sweats.HA - frontal. Feels like head is in a vice. The patient denies being short of breath. Onset was 3-5 days ago. Gradually worsening. Tried OTCs without improvement.  PMH: Smoking status noted ROS: Per HPI  Objective: BP 138/73   Pulse 86   Temp 98.9 F (37.2 C) (Oral)   Ht 6' 1.5" (1.867 m)   Wt 235 lb (106.6 kg)   BMI 30.58 kg/m  Gen: NAD, alert, cooperative with exam HEENT: NCAT, Nasal passages swollen, red TMS RED CV: RRR, good S1/S2, no murmur Resp: Bronchitis changes with scattered wheezes, non-labored Ext: No edema, warm Neuro: Alert and oriented, No gross deficits  Assessment and plan:  1. Acute maxillary sinusitis, recurrence not specified     Meds ordered this encounter  Medications  . amoxicillin-clavulanate (AUGMENTIN) 875-125 MG tablet    Sig: Take 1 tablet by mouth 2 (two) times daily.    Dispense:  20 tablet    Refill:  0  . Pseudoephedrine-Guaifenesin (MUCINEX D MAX STRENGTH) [phone] MG TB12    Sig: Take 1 by mouth twice daily    Dispense:  20 each    Refill:  0    No orders of the defined types were placed in this encounter.   Follow up as needed.  Claretta Fraise, MD

## 2017-03-18 ENCOUNTER — Telehealth: Payer: Self-pay | Admitting: Family

## 2017-03-18 DIAGNOSIS — B351 Tinea unguium: Secondary | ICD-10-CM

## 2017-03-18 MED ORDER — TERBINAFINE HCL 250 MG PO TABS: 250.0000 mg | ORAL_TABLET | Freq: Every day | ORAL | 0 refills | Status: DC

## 2017-03-18 NOTE — Telephone Encounter (Signed)
Prescription sent to pharmacy.

## 2017-03-29 ENCOUNTER — Emergency Department (HOSPITAL_COMMUNITY)
Admission: EM | Admit: 2017-03-29 | Discharge: 2017-03-30 | Disposition: A | Discharge status: Home / Self Care | Payer: BLUE CROSS/BLUE SHIELD | Attending: Emergency Medicine | Admitting: Emergency Medicine

## 2017-03-29 ENCOUNTER — Other Ambulatory Visit: Payer: Self-pay

## 2017-03-29 ENCOUNTER — Encounter (HOSPITAL_COMMUNITY): Payer: Self-pay | Admitting: *Deleted

## 2017-03-29 DIAGNOSIS — K529 Noninfective gastroenteritis and colitis, unspecified: Secondary | ICD-10-CM | POA: Diagnosis not present

## 2017-03-29 DIAGNOSIS — I10 Essential (primary) hypertension: Secondary | ICD-10-CM | POA: Insufficient documentation

## 2017-03-29 DIAGNOSIS — E119 Type 2 diabetes mellitus without complications: Secondary | ICD-10-CM | POA: Diagnosis not present

## 2017-03-29 DIAGNOSIS — Z79899 Other long term (current) drug therapy: Secondary | ICD-10-CM | POA: Insufficient documentation

## 2017-03-29 DIAGNOSIS — R531 Weakness: Secondary | ICD-10-CM | POA: Diagnosis present

## 2017-03-29 DIAGNOSIS — I951 Orthostatic hypotension: Secondary | ICD-10-CM | POA: Insufficient documentation

## 2017-03-29 DIAGNOSIS — R1110 Vomiting, unspecified: Secondary | ICD-10-CM | POA: Diagnosis not present

## 2017-03-29 DIAGNOSIS — Z7982 Long term (current) use of aspirin: Secondary | ICD-10-CM | POA: Insufficient documentation

## 2017-03-29 DIAGNOSIS — R109 Unspecified abdominal pain: Secondary | ICD-10-CM | POA: Diagnosis not present

## 2017-03-29 DIAGNOSIS — Z7984 Long term (current) use of oral hypoglycemic drugs: Secondary | ICD-10-CM | POA: Diagnosis not present

## 2017-03-29 LAB — CBC
HEMATOCRIT: 49.1 % (ref 39.0–52.0)
HEMOGLOBIN: 15 g/dL (ref 13.0–17.0)
MCH: 24 pg — AB (ref 26.0–34.0)
MCHC: 30.5 g/dL (ref 30.0–36.0)
MCV: 78.4 fL (ref 78.0–100.0)
Platelets: 220 10*3/uL (ref 150–400)
RBC: 6.26 MIL/uL — AB (ref 4.22–5.81)
RDW: 15.1 % (ref 11.5–15.5)
WBC: 14.5 10*3/uL — ABNORMAL HIGH (ref 4.0–10.5)

## 2017-03-29 MED ORDER — ONDANSETRON HCL 4 MG/2ML IJ SOLN
4.0000 mg | Freq: Once | INTRAMUSCULAR | Status: AC
  Administered 2017-03-30: 4 mg via INTRAVENOUS
  Filled 2017-03-29: qty 2

## 2017-03-29 MED ORDER — SODIUM CHLORIDE 0.9 % IV BOLUS (SEPSIS)
1000.0000 mL | Freq: Once | INTRAVENOUS | Status: AC
  Administered 2017-03-30: 1000 mL via INTRAVENOUS

## 2017-03-29 NOTE — ED Triage Notes (Addendum)
Pt reports n/d, weakness, belching up "rotten egg" smell since Tuesday. Pt states he passed out tonight after dry heaving. Pt reports only 1 episode of diarrhea today. Pt also reports that his stomach is rumbling.

## 2017-03-30 ENCOUNTER — Encounter (HOSPITAL_COMMUNITY): Payer: Self-pay

## 2017-03-30 ENCOUNTER — Emergency Department (HOSPITAL_COMMUNITY): Payer: BLUE CROSS/BLUE SHIELD

## 2017-03-30 DIAGNOSIS — R109 Unspecified abdominal pain: Secondary | ICD-10-CM | POA: Diagnosis not present

## 2017-03-30 DIAGNOSIS — R1110 Vomiting, unspecified: Secondary | ICD-10-CM | POA: Diagnosis not present

## 2017-03-30 LAB — COMPREHENSIVE METABOLIC PANEL
ALK PHOS: 64 U/L (ref 38–126)
ALT: 32 U/L (ref 17–63)
ANION GAP: 12 (ref 5–15)
AST: 25 U/L (ref 15–41)
Albumin: 4 g/dL (ref 3.5–5.0)
BUN: 19 mg/dL (ref 6–20)
CALCIUM: 9.2 mg/dL (ref 8.9–10.3)
CHLORIDE: 103 mmol/L (ref 101–111)
CO2: 22 mmol/L (ref 22–32)
Creatinine, Ser: 1.09 mg/dL (ref 0.61–1.24)
GFR calc non Af Amer: >60 mL/min (ref >60)
Glucose, Bld: 200 mg/dL — ABNORMAL HIGH (ref 65–99)
POTASSIUM: 4.3 mmol/L (ref 3.5–5.1)
SODIUM: 137 mmol/L (ref 135–145)
Total Bilirubin: 0.8 mg/dL (ref 0.3–1.2)
Total Protein: 7 g/dL (ref 6.5–8.1)

## 2017-03-30 LAB — TROPONIN I
Troponin I: <0.03 ng/mL (ref <0.03)
Troponin I: <0.03 ng/mL (ref <0.03)

## 2017-03-30 LAB — URINALYSIS, ROUTINE W REFLEX MICROSCOPIC
BACTERIA UA: NONE SEEN
Bilirubin Urine: NEGATIVE
KETONES UR: NEGATIVE mg/dL
Leukocytes, UA: NEGATIVE
NITRITE: NEGATIVE
Protein, ur: NEGATIVE mg/dL
Specific Gravity, Urine: 1.026 (ref 1.005–1.030)
Squamous Epithelial / LPF: NONE SEEN
pH: 5 (ref 5.0–8.0)

## 2017-03-30 LAB — LIPASE, BLOOD: LIPASE: 25 U/L (ref 11–51)

## 2017-03-30 MED ORDER — IOPAMIDOL (ISOVUE-300) INJECTION 61%
100.0000 mL | Freq: Once | INTRAVENOUS | Status: AC | PRN
  Administered 2017-03-30: 100 mL via INTRAVENOUS

## 2017-03-30 MED ORDER — SODIUM CHLORIDE 0.9 % IV BOLUS (SEPSIS)
1000.0000 mL | Freq: Once | INTRAVENOUS | Status: AC
  Administered 2017-03-30: 1000 mL via INTRAVENOUS

## 2017-03-30 MED ORDER — ONDANSETRON 4 MG PO TBDP
4.0000 mg | ORAL_TABLET | Freq: Three times a day (TID) | ORAL | 0 refills | Status: DC | PRN
Start: 2017-03-30 — End: 2017-07-26

## 2017-03-30 MED ORDER — DICYCLOMINE HCL 20 MG PO TABS: 20.0000 mg | ORAL_TABLET | Freq: Two times a day (BID) | ORAL | 0 refills | Status: DC

## 2017-03-30 NOTE — ED Provider Notes (Signed)
[facility] EMERGENCY DEPARTMENT Provider Note   CSN: [id number] Arrival date & time: 03/29/17  2231     History   Chief Complaint Chief Complaint  Patient presents with  . Weakness    HPI Jason Rogers is a 62 y.o. male.  Patient presents he is not felt well since for the past 4 days.  Started his diarrhea that he had on and off earlier this week seem to resolve.  Diarrhea returned last night and became ill again yesterday evening.  He had 2 episodes of diarrhea in the past 24 hours that have been nonbloody.  He has had nausea and dry heaving but no vomiting.  Episode of syncope today while dry heaving from a standing position.  He was caught by his wife and lowered to the ground.  Did not have any head injury.  No chest pain or shortness of breath.  Complains of "rumbling" in his stomach that comes and goes but is worse over the past several days.  Denies any abdominal pain.  He is able to eat breakfast and lunch today.  Has not had a fever.  No sick contacts or recent travel.  No recent antibiotic use.  He is a diabetic but does not regularly check his blood sugars.  He is not on insulin.  He denies any dizziness or lightheadedness currently.   The history is provided by the patient.  Weakness  Primary symptoms include no dizziness. Associated symptoms include vomiting. Pertinent negatives include no shortness of breath, no chest pain and no headaches.    Past Medical History:  Diagnosis Date  . Allergy   . Diabetes mellitus without complication ([place])   . Hyperlipidemia   . Hypertension     Patient Active Problem List   Diagnosis Date Noted  . GERD (gastroesophageal reflux disease) 12/28/2016  . Overweight (BMI 25.0-29.9) 11/18/2015  . Hyperlipidemia associated with type 2 diabetes mellitus ([place]) 04/10/2013  . Hypertension associated with diabetes ([place]) 04/10/2013  . Diabetes mellitus, type 2 ([place]) 04/10/2013    Past Surgical History:  Procedure Laterality Date  .  HERNIA REPAIR    . TESTICLE SURGERY    . TONSILLECTOMY AND ADENOIDECTOMY    . VASECTOMY         Home Medications    Prior to Admission medications   Medication Sig Start Date End Date Taking? Authorizing Provider  amoxicillin-clavulanate (AUGMENTIN) 875-125 MG tablet Take 1 tablet by mouth 2 (two) times daily. 02/20/17   Claretta Fraise, MD  aspirin 81 MG tablet Take 81 mg by mouth daily.    [provider]  Cholecalciferol (VITAMIN D) 2000 units CAPS Take by mouth.    [provider]  Dulaglutide (TRULICITY) 1.5 HE/5.2DP SOPN Inject 1.5 mg into the skin once a week. 06/04/16   Sharion Balloon, FNP  INVOKANA 300 MG TABS tablet TAKE 1 TABLET BY MOUTH ONCE DAILY 01/14/17   Evelina Dun A, FNP  JANUMET 50-1000 MG tablet TAKE 1 TABLET BY MOUTH TWICE DAILY WITH A MEAL 02/20/17   Hawks, Alyse Low A, FNP  lisinopril (PRINIVIL,ZESTRIL) 20 MG tablet TAKE 1 TABLET BY MOUTH ONCE DAILY 01/14/17   Evelina Dun A, FNP  loratadine (CLARITIN) 10 MG tablet Take 10 mg by mouth daily.    [provider]  omeprazole (PRILOSEC) 20 MG capsule TAKE 1 CAPSULE BY MOUTH ONCE DAILY 02/20/17   Hawks, Christy A, FNP  Pseudoephedrine-Guaifenesin (MUCINEX D MAX STRENGTH) [phone] MG TB12 Take 1 by mouth twice daily  02/20/17   Claretta Fraise, MD  simvastatin (ZOCOR) 10 MG tablet TAKE 1 TABLET BY MOUTH AT BEDTIME 01/14/17   Hawks, Alyse Low A, FNP  terbinafine (LAMISIL) 250 MG tablet Take 1 tablet (250 mg total) by mouth daily. 03/18/17   Sharion Balloon, FNP    Family History Family History  Problem Relation Age of Onset  . Diabetes Mother   . Heart disease Father     Social History Social History   Tobacco Use  . Smoking status: Never Smoker  . Smokeless tobacco: Never Used  Substance Use Topics  . Alcohol use: Yes    Comment: rare  . Drug use: No     Allergies   Lipitor [atorvastatin]   Review of Systems Review of Systems  Constitutional: Positive for activity change,  appetite change and fatigue. Negative for fever.  HENT: Negative for congestion and rhinorrhea.   Respiratory: Negative for cough, chest tightness and shortness of breath.   Cardiovascular: Negative for chest pain.  Gastrointestinal: Positive for abdominal pain, diarrhea, nausea and vomiting.  Genitourinary: Negative for dysuria, hematuria, testicular pain and urgency.  Musculoskeletal: Negative for arthralgias and myalgias.  Skin: Negative for rash.  Neurological: Positive for weakness. Negative for dizziness, light-headedness and headaches.   all other systems are negative except as noted in the HPI and PMH.     Physical Exam Updated Vital Signs BP 108/70 (BP Location: Right Arm)   Pulse (!) 107   Temp 97.6 F (36.4 C) (Oral)   Resp 15   Ht 6' (1.829 m)   Wt 108.9 kg (240 lb)   SpO2 94%   BMI 32.55 kg/m   Physical Exam  Constitutional: He is oriented to person, place, and time. He appears well-developed and well-nourished. No distress.  Dry mucus membranes  HENT:  Head: Normocephalic and atraumatic.  Mouth/Throat: Oropharynx is clear and moist. No oropharyngeal exudate.  Eyes: Conjunctivae and EOM are normal. Pupils are equal, round, and reactive to light.  Neck: Normal range of motion. Neck supple.  No meningismus.  Cardiovascular: Normal rate, regular rhythm, normal heart sounds and intact distal pulses.  No murmur heard. Pulmonary/Chest: Effort normal and breath sounds normal. No respiratory distress.  Abdominal: Soft. There is tenderness. There is no rebound and no guarding.  Mild diffuse tenderness  Musculoskeletal: Normal range of motion. He exhibits no edema or tenderness.  Neurological: He is alert and oriented to person, place, and time. No cranial nerve deficit. He exhibits normal muscle tone. Coordination normal.  No ataxia on finger to nose bilaterally. No pronator drift. 5/5 strength throughout. CN 2-12 intact.Equal grip strength. Sensation intact.   Skin:  Skin is warm.  Psychiatric: He has a normal mood and affect. His behavior is normal.  Nursing note and vitals reviewed.    ED Treatments / Results  Labs (all labs ordered are listed, but only abnormal results are displayed) Labs Reviewed  COMPREHENSIVE METABOLIC PANEL - Abnormal; Notable for the following components:      Result Value   Glucose, Bld 200 (*)    All other components within normal limits  CBC - Abnormal; Notable for the following components:   WBC 14.5 (*)    RBC 6.26 (*)    MCH 24.0 (*)    All other components within normal limits  URINALYSIS, ROUTINE W REFLEX MICROSCOPIC - Abnormal; Notable for the following components:   Glucose, UA >=500 (*)    Hgb urine dipstick SMALL (*)    All other components  within normal limits  LIPASE, BLOOD  TROPONIN I  TROPONIN I    EKG  EKG Interpretation  Date/Time:  Saturday March 30 2017 00:30:42 EST Ventricular Rate:  92 PR Interval:    QRS Duration: 88 QT Interval:  350 QTC Calculation: 433 R Axis:   86 Text Interpretation:  Sinus rhythm Borderline right axis deviation No previous ECGs available Confirmed by Ezequiel Essex [phone]) on 03/30/2017 2:08:03 AM       Radiology Ct Abdomen Pelvis W Contrast  Result Date: 03/30/2017 CLINICAL DATA:  Acute generalized abdominal pain, nausea, vomiting. EXAM: CT ABDOMEN AND PELVIS WITH CONTRAST TECHNIQUE: Multidetector CT imaging of the abdomen and pelvis was performed using the standard protocol following bolus administration of intravenous contrast. CONTRAST:  160mL ISOVUE-300 IOPAMIDOL (ISOVUE-300) INJECTION 61% COMPARISON:  None. FINDINGS: Lower chest: No acute abnormality. Hepatobiliary: No focal liver abnormality is seen. No gallstones, gallbladder wall thickening, or biliary dilatation. Pancreas: Unremarkable. No pancreatic ductal dilatation or surrounding inflammatory changes. Spleen: Normal in size without focal abnormality. Adrenals/Urinary Tract: Adrenal glands appear  normal. Small nonobstructive left renal calculus is noted. Left renal cyst is noted. No hydronephrosis or renal obstruction is noted. Urinary bladder is unremarkable. Stomach/Bowel: Stomach is within normal limits. Appendix appears normal. No evidence of bowel wall thickening, distention, or inflammatory changes. Vascular/Lymphatic: No significant vascular findings are present. No enlarged abdominal or pelvic lymph nodes. Reproductive: Mild prostatic enlargement is noted. Other: No abdominal wall hernia or abnormality. No abdominopelvic ascites. Musculoskeletal: No acute or significant osseous findings. IMPRESSION: Small nonobstructive left renal calculus. No hydronephrosis or renal obstruction is noted. Mild prostatic enlargement. No other abnormality seen in the abdomen or pelvis. Electronically Signed   By: Marijo Conception, M.D.   On: 03/30/2017 02:40    Procedures Procedures (including critical care time)  Medications Ordered in ED Medications  sodium chloride 0.9 % bolus 1,000 mL (not administered)  ondansetron (ZOFRAN) injection 4 mg (not administered)     Initial Impression / Assessment and Plan / ED Course  I have reviewed the triage vital signs and the nursing notes.  Pertinent labs & imaging results that were available during my care of the patient were reviewed by me and considered in my medical decision making (see chart for details).    Diarrheal illness with abdominal cramping, nausea and dry heaving with syncopal episode today.  Patient appears mildly dehydrated.  Patient given IV fluids and symptom control.  Labs significant for leukocytosis.  Suspect syncopal episode in setting of GI illness and dehydration.  Orthostatics are positive.  EKG is normal sinus rhythm.  No prolonged QT or Brugada.  No chest pain or shortness of breath.  Patient feels improved after 2 L of IV fluid.  He is tolerating p.o.  Troponin is negative x2.  CT scan is reassuring. Negative UA and normal  anion gap. No vomiting or diarrhea while in the ED.  Suspect viral illness.  We will treat supportively with antiemetics and Bentyl.  Follow-up with PCP.  Return precautions discussed.  Final Clinical Impressions(s) / ED Diagnoses   Final diagnoses:  Gastroenteritis  Orthostasis    ED Discharge Orders    None       Taleen Prosser, Annie Main, MD 03/30/17 [phone]

## 2017-03-30 NOTE — Discharge Instructions (Signed)
Keep yourself hydrated.  Use the nausea medicine as needed.  Follow-up with your doctor this week.  Return to the ED if you develop new or worsening symptoms.

## 2017-04-15 ENCOUNTER — Other Ambulatory Visit: Payer: Self-pay | Admitting: Family

## 2017-04-15 DIAGNOSIS — E119 Type 2 diabetes mellitus without complications: Secondary | ICD-10-CM

## 2017-04-23 ENCOUNTER — Encounter: Payer: Self-pay | Admitting: *Deleted

## 2017-05-03 ENCOUNTER — Encounter: Payer: Self-pay | Admitting: Family

## 2017-05-03 ENCOUNTER — Ambulatory Visit: Payer: BLUE CROSS/BLUE SHIELD | Admitting: Family

## 2017-05-03 VITALS — BP 124/71 | HR 106 | Temp 100.8°F | Ht 72.0 in | Wt 228.0 lb

## 2017-05-03 DIAGNOSIS — R05 Cough: Secondary | ICD-10-CM | POA: Diagnosis not present

## 2017-05-03 DIAGNOSIS — J101 Influenza due to other identified influenza virus with other respiratory manifestations: Secondary | ICD-10-CM

## 2017-05-03 DIAGNOSIS — R6889 Other general symptoms and signs: Secondary | ICD-10-CM

## 2017-05-03 DIAGNOSIS — R059 Cough, unspecified: Secondary | ICD-10-CM

## 2017-05-03 LAB — VERITOR FLU A/B WAIVED
INFLUENZA B: NEGATIVE
Influenza A: POSITIVE — AB

## 2017-05-03 MED ORDER — BENZONATATE 200 MG PO CAPS: 200.0000 mg | ORAL_CAPSULE | Freq: Three times a day (TID) | ORAL | 1 refills | Status: DC | PRN

## 2017-05-03 MED ORDER — OSELTAMIVIR PHOSPHATE 75 MG PO CAPS: 75.0000 mg | ORAL_CAPSULE | Freq: Two times a day (BID) | ORAL | 0 refills | Status: DC

## 2017-05-03 NOTE — Progress Notes (Signed)
   Subjective:    Patient ID: Jason Rogers, male    DOB: [birth year], 62 y.o.   MRN: [id number]  Cough  This is a new problem. The current episode started in the past 7 days. The problem has been waxing and waning. The problem occurs every few minutes. The cough is non-productive. Associated symptoms include chills, a fever, headaches, myalgias, nasal congestion, postnasal drip and rhinorrhea. Pertinent negatives include no ear congestion, ear pain, sore throat or wheezing. He has tried rest and OTC cough suppressant for the symptoms. The treatment provided mild relief.      Review of Systems  Constitutional: Positive for chills and fever.  HENT: Positive for postnasal drip and rhinorrhea. Negative for ear pain and sore throat.   Respiratory: Positive for cough. Negative for wheezing.   Musculoskeletal: Positive for myalgias.  Neurological: Positive for headaches.  All other systems reviewed and are negative.      Objective:   Physical Exam  Constitutional: He is oriented to person, place, and time. He appears well-developed and well-nourished. No distress.  HENT:  Head: Normocephalic.  Right Ear: External ear normal.  Left Ear: External ear normal.  Nose: Mucosal edema and rhinorrhea present.  Mouth/Throat: Posterior oropharyngeal erythema present.  Eyes: Pupils are equal, round, and reactive to light. Right eye exhibits no discharge. Left eye exhibits no discharge.  Neck: Normal range of motion. Neck supple. No thyromegaly present.  Cardiovascular: Normal rate, regular rhythm, normal heart sounds and intact distal pulses.  No murmur heard. Pulmonary/Chest: Effort normal and breath sounds normal. No respiratory distress. He has no wheezes.  Abdominal: Soft. Bowel sounds are normal. He exhibits no distension. There is no tenderness.  Musculoskeletal: Normal range of motion. He exhibits no edema or tenderness.  Neurological: He is alert and oriented to person, place, and time. He  has normal reflexes. No cranial nerve deficit.  Skin: Skin is warm and dry. No rash noted. No erythema.  Psychiatric: He has a normal mood and affect. His behavior is normal. Judgment and thought content normal.  Vitals reviewed.     BP 124/71   Pulse (!) 106   Temp (!) 100.8 F (38.2 C) (Oral)   Ht 6' (1.829 m)   Wt 228 lb (103.4 kg)   BMI 30.92 kg/m      Assessment & Plan:  1. Flu-like symptoms - Veritor Flu A/B Waived  2. Cough - benzonatate (TESSALON) 200 MG capsule; Take 1 capsule (200 mg total) by mouth 3 (three) times daily as needed.  Dispense: 30 capsule; Refill: 1  3. Influenza A Rest Force fluids Tylenol or motrin Good hand hygiene discussed, cover mouth when coughing or sneezing RTO prn or if symptoms worsen or do not improve - oseltamivir (TAMIFLU) 75 MG capsule; Take 1 capsule (75 mg total) by mouth 2 (two) times daily.  Dispense: 10 capsule; Refill: 0    Evelina Dun, FNP

## 2017-05-03 NOTE — Patient Instructions (Signed)

## 2017-05-05 ENCOUNTER — Other Ambulatory Visit: Payer: Self-pay | Admitting: Family

## 2017-05-20 ENCOUNTER — Other Ambulatory Visit: Payer: Self-pay | Admitting: Family

## 2017-05-20 DIAGNOSIS — E119 Type 2 diabetes mellitus without complications: Secondary | ICD-10-CM

## 2017-05-20 NOTE — Telephone Encounter (Signed)
OV 06/21/17 

## 2017-05-30 ENCOUNTER — Encounter: Payer: Self-pay | Admitting: *Deleted

## 2017-06-03 ENCOUNTER — Telehealth: Payer: Self-pay | Admitting: Family

## 2017-06-03 DIAGNOSIS — E785 Hyperlipidemia, unspecified: Secondary | ICD-10-CM

## 2017-06-03 DIAGNOSIS — K219 Gastro-esophageal reflux disease without esophagitis: Secondary | ICD-10-CM

## 2017-06-03 DIAGNOSIS — E1159 Type 2 diabetes mellitus with other circulatory complications: Secondary | ICD-10-CM

## 2017-06-03 DIAGNOSIS — I10 Essential (primary) hypertension: Secondary | ICD-10-CM

## 2017-06-03 DIAGNOSIS — Z0000 Encounter for general adult medical examination without abnormal findings: Secondary | ICD-10-CM

## 2017-06-03 DIAGNOSIS — E1165 Type 2 diabetes mellitus with hyperglycemia: Secondary | ICD-10-CM

## 2017-06-03 DIAGNOSIS — E1169 Type 2 diabetes mellitus with other specified complication: Secondary | ICD-10-CM

## 2017-06-03 DIAGNOSIS — I152 Hypertension secondary to endocrine disorders: Secondary | ICD-10-CM

## 2017-06-04 NOTE — Telephone Encounter (Signed)
Labs ordered.

## 2017-06-20 ENCOUNTER — Encounter: Payer: BLUE CROSS/BLUE SHIELD | Admitting: Family

## 2017-06-21 ENCOUNTER — Encounter: Payer: BLUE CROSS/BLUE SHIELD | Admitting: Family

## 2017-06-27 ENCOUNTER — Encounter: Payer: BLUE CROSS/BLUE SHIELD | Admitting: Family

## 2017-06-28 ENCOUNTER — Encounter: Payer: BLUE CROSS/BLUE SHIELD | Admitting: Family

## 2017-07-15 ENCOUNTER — Other Ambulatory Visit: Payer: Self-pay | Admitting: Family

## 2017-07-15 DIAGNOSIS — E785 Hyperlipidemia, unspecified: Secondary | ICD-10-CM

## 2017-07-15 DIAGNOSIS — E119 Type 2 diabetes mellitus without complications: Secondary | ICD-10-CM

## 2017-07-19 ENCOUNTER — Encounter: Payer: BLUE CROSS/BLUE SHIELD | Admitting: Family

## 2017-07-26 ENCOUNTER — Ambulatory Visit (INDEPENDENT_AMBULATORY_CARE_PROVIDER_SITE_OTHER): Payer: BLUE CROSS/BLUE SHIELD | Admitting: Family

## 2017-07-26 ENCOUNTER — Encounter: Payer: Self-pay | Admitting: Family

## 2017-07-26 VITALS — BP 129/82 | HR 79 | Temp 97.9°F | Ht 72.0 in | Wt 229.0 lb

## 2017-07-26 DIAGNOSIS — K219 Gastro-esophageal reflux disease without esophagitis: Secondary | ICD-10-CM

## 2017-07-26 DIAGNOSIS — E1165 Type 2 diabetes mellitus with hyperglycemia: Secondary | ICD-10-CM | POA: Diagnosis not present

## 2017-07-26 DIAGNOSIS — Z0000 Encounter for general adult medical examination without abnormal findings: Secondary | ICD-10-CM

## 2017-07-26 DIAGNOSIS — E1169 Type 2 diabetes mellitus with other specified complication: Secondary | ICD-10-CM | POA: Diagnosis not present

## 2017-07-26 DIAGNOSIS — E1159 Type 2 diabetes mellitus with other circulatory complications: Secondary | ICD-10-CM

## 2017-07-26 DIAGNOSIS — I152 Hypertension secondary to endocrine disorders: Secondary | ICD-10-CM

## 2017-07-26 DIAGNOSIS — E785 Hyperlipidemia, unspecified: Secondary | ICD-10-CM

## 2017-07-26 DIAGNOSIS — E663 Overweight: Secondary | ICD-10-CM

## 2017-07-26 DIAGNOSIS — I10 Essential (primary) hypertension: Secondary | ICD-10-CM

## 2017-07-26 LAB — BAYER DCA HB A1C WAIVED: HB A1C: 8 % — AB (ref <7.0)

## 2017-07-26 NOTE — Patient Instructions (Signed)
Leg Cramps Leg cramps occur when a muscle or muscles tighten and you have no control over this tightening (involuntary muscle contraction). Muscle cramps can develop in any muscle, but the most common place is in the calf muscles of the leg. Those cramps can occur during exercise or when you are at rest. Leg cramps are painful, and they may last for a few seconds to a few minutes. Cramps may return several times before they finally stop. Usually, leg cramps are not caused by a serious medical problem. In many cases, the cause is not known. Some common causes include:  Overexertion.  Overuse from repetitive motions, or doing the same thing over and over.  Remaining in a certain position for a long period of time.  Improper preparation, form, or technique while performing a sport or an activity.  Dehydration.  Injury.  Side effects of some medicines.  Abnormally low levels of the salts and ions in your blood (electrolytes), especially potassium and calcium. These levels could be low if you are taking water pills (diuretics) or if you are pregnant.  Follow these instructions at home: Watch your condition for any changes. Taking the following actions may help to lessen any discomfort that you are feeling:  Stay well-hydrated. Drink enough fluid to keep your urine clear or pale yellow.  Try massaging, stretching, and relaxing the affected muscle. Do this for several minutes at a time.  For tight or tense muscles, use a warm towel, heating pad, or hot shower water directed to the affected area.  If you are sore or have pain after a cramp, applying ice to the affected area may relieve discomfort. ? Put ice in a plastic bag. ? Place a towel between your skin and the bag. ? Leave the ice on for 20 minutes, 2-3 times per day.  Avoid strenuous exercise for several days if you have been having frequent leg cramps.  Make sure that your diet includes the essential minerals for your muscles to  work normally.  Take medicines only as directed by your health care provider.  Contact a health care provider if:  Your leg cramps get more severe or more frequent, or they do not improve over time.  Your foot becomes cold, numb, or blue. This information is not intended to replace advice given to you by your health care provider. Make sure you discuss any questions you have with your health care provider. Document Released: 03/22/2004 Document Revised: 07/21/2015 Document Reviewed: 01/20/2014 Elsevier Interactive Patient Education  2018 [employer]  

## 2017-07-26 NOTE — Progress Notes (Signed)
Subjective:    Patient ID: Jason Rogers, male    DOB: November 27, 1955, 62 y.o.   MRN: [id number]  Chief Complaint  Patient presents with  . Medical Management of Chronic Issues   Pt presents to the office today for CPE.  Diabetes  He presents for his follow-up diabetic visit. He has type 2 diabetes mellitus. His disease course has been stable. There are no hypoglycemic associated symptoms. Pertinent negatives for diabetes include no blurred vision, no foot paresthesias and no visual change. There are no hypoglycemic complications. Symptoms are stable. Pertinent negatives for diabetic complications include no CVA, heart disease, nephropathy or peripheral neuropathy. Risk factors for coronary artery disease include dyslipidemia, diabetes mellitus, male sex, obesity, hypertension and sedentary lifestyle. He is following a generally unhealthy diet. (Does not check at home ) Eye exam is not current.  Hyperlipidemia  This is a chronic problem. The current episode started more than 1 year ago. The problem is uncontrolled. Recent lipid tests were reviewed and are high. Exacerbating diseases include obesity. Pertinent negatives include no shortness of breath. Current antihyperlipidemic treatment includes statins. The current treatment provides moderate improvement of lipids. Risk factors for coronary artery disease include dyslipidemia, diabetes mellitus, male sex and hypertension.  Hypertension  This is a chronic problem. The current episode started more than 1 year ago. The problem has been resolved since onset. The problem is controlled. Pertinent negatives include no blurred vision, malaise/fatigue, peripheral edema or shortness of breath. The current treatment provides moderate improvement. There is no history of kidney disease, CAD/MI or CVA.  Gastroesophageal Reflux  He reports no belching, no coughing or no heartburn. This is a chronic problem. The current episode started more than 1 year ago. The  problem occurs occasionally. The problem has been waxing and waning. Risk factors include obesity. He has tried a PPI for the symptoms. The treatment provided moderate relief.      Review of Systems  Constitutional: Negative for malaise/fatigue.  Eyes: Negative for blurred vision.  Respiratory: Negative for cough and shortness of breath.   Gastrointestinal: Negative for heartburn.  All other systems reviewed and are negative.      Objective:   Physical Exam  Constitutional: He is oriented to person, place, and time. He appears well-developed and well-nourished. No distress.  HENT:  Head: Normocephalic.  Right Ear: External ear normal.  Left Ear: External ear normal.  Nose: Nose normal.  Mouth/Throat: Oropharynx is clear and moist.  Eyes: Pupils are equal, round, and reactive to light. Right eye exhibits no discharge. Left eye exhibits no discharge.  Neck: Normal range of motion. Neck supple. No thyromegaly present.  Cardiovascular: Normal rate, regular rhythm, normal heart sounds and intact distal pulses.  No murmur heard. Pulmonary/Chest: Effort normal and breath sounds normal. No respiratory distress. He has no wheezes.  Abdominal: Soft. Bowel sounds are normal. He exhibits no distension. There is no tenderness.  Musculoskeletal: Normal range of motion. He exhibits no edema or tenderness.  Neurological: He is alert and oriented to person, place, and time. He has normal reflexes. No cranial nerve deficit.  Skin: Skin is warm and dry. No rash noted. No erythema.  Psychiatric: He has a normal mood and affect. His behavior is normal. Judgment and thought content normal.  Vitals reviewed.    BP 129/82   Pulse 79   Temp 97.9 F (36.6 C) (Oral)   Ht 6' (1.829 m)   Wt 229 lb (103.9 kg)   BMI 31.06 kg/m  Assessment & Plan:  Jason Rogers comes in today with chief complaint of Medical Management of Chronic Issues and Annual Exam   Diagnosis and orders addressed:  1.  Annual physical exam - Bayer DCA Hb A1c Waived - CMP14+EGFR - Lipid panel - CBC with Differential/Platelet - TSH - PSA, total and free  2. Gastroesophageal reflux disease, esophagitis presence not specified - CMP14+EGFR - CBC with Differential/Platelet  3. Hyperlipidemia associated with type 2 diabetes mellitus (HCC) - CMP14+EGFR - CBC with Differential/Platelet  4. Type 2 diabetes mellitus with hyperglycemia, without long-term current use of insulin (HCC) - CMP14+EGFR - CBC with Differential/Platelet  5. Hypertension associated with diabetes ([place])  - CMP14+EGFR - CBC with Differential/Platelet  6. Overweight (BMI 25.0-29.9 - CMP14+EGFR - CBC with Differential/Platelet   Labs pending Health Maintenance reviewed Diet and exercise encouraged  Follow up plan: 3 months    Evelina Dun, FNP

## 2017-07-27 LAB — CBC WITH DIFFERENTIAL/PLATELET
BASOS ABS: 0 10*3/uL (ref 0.0–0.2)
Basos: 1 %
EOS (ABSOLUTE): 0.1 10*3/uL (ref 0.0–0.4)
Eos: 2 %
Hematocrit: 40.2 % (ref 37.5–51.0)
Hemoglobin: 13 g/dL (ref 13.0–17.7)
Immature Grans (Abs): 0 10*3/uL (ref 0.0–0.1)
Immature Granulocytes: 0 %
LYMPHS ABS: 1.7 10*3/uL (ref 0.7–3.1)
Lymphs: 21 %
MCH: 23.8 pg — AB (ref 26.6–33.0)
MCHC: 32.3 g/dL (ref 31.5–35.7)
MCV: 74 fL — ABNORMAL LOW (ref 79–97)
MONOCYTES: 9 %
MONOS ABS: 0.8 10*3/uL (ref 0.1–0.9)
NEUTROS ABS: 5.5 10*3/uL (ref 1.4–7.0)
Neutrophils: 67 %
Platelets: 216 10*3/uL (ref 150–450)
RBC: 5.46 x10E6/uL (ref 4.14–5.80)
RDW: 16.5 % — ABNORMAL HIGH (ref 12.3–15.4)
WBC: 8.1 10*3/uL (ref 3.4–10.8)

## 2017-07-27 LAB — CMP14+EGFR
ALT: 23 IU/L (ref 0–44)
AST: 20 IU/L (ref 0–40)
Albumin/Globulin Ratio: 2.1 (ref 1.2–2.2)
Albumin: 4.6 g/dL (ref 3.6–4.8)
Alkaline Phosphatase: 58 IU/L (ref 39–117)
BUN/Creatinine Ratio: 23 (ref 10–24)
BUN: 22 mg/dL (ref 8–27)
Bilirubin Total: 0.7 mg/dL (ref 0.0–1.2)
CALCIUM: 9.6 mg/dL (ref 8.6–10.2)
CO2: 19 mmol/L — AB (ref 20–29)
CREATININE: 0.94 mg/dL (ref 0.76–1.27)
Chloride: 106 mmol/L (ref 96–106)
GFR calc Af Amer: 99 mL/min/{1.73_m2} (ref >59)
GFR calc non Af Amer: 86 mL/min/{1.73_m2} (ref >59)
Globulin, Total: 2.2 g/dL (ref 1.5–4.5)
Glucose: 137 mg/dL — ABNORMAL HIGH (ref 65–99)
Potassium: 4.8 mmol/L (ref 3.5–5.2)
Sodium: 141 mmol/L (ref 134–144)
Total Protein: 6.8 g/dL (ref 6.0–8.5)

## 2017-07-27 LAB — LIPID PANEL
CHOL/HDL RATIO: 3.4 ratio (ref 0.0–5.0)
Cholesterol, Total: 162 mg/dL (ref 100–199)
HDL: 47 mg/dL (ref >39)
LDL CALC: 86 mg/dL (ref 0–99)
TRIGLYCERIDES: 146 mg/dL (ref 0–149)
VLDL CHOLESTEROL CAL: 29 mg/dL (ref 5–40)

## 2017-07-27 LAB — PSA, TOTAL AND FREE
PSA FREE PCT: 18.8 %
PSA FREE: 0.47 ng/mL
Prostate Specific Ag, Serum: 2.5 ng/mL (ref 0.0–4.0)

## 2017-07-27 LAB — TSH: TSH: 2.98 u[IU]/mL (ref 0.450–4.500)

## 2017-07-30 ENCOUNTER — Other Ambulatory Visit: Payer: Self-pay | Admitting: Family

## 2017-07-31 ENCOUNTER — Telehealth: Payer: Self-pay | Admitting: Family

## 2017-07-31 ENCOUNTER — Other Ambulatory Visit: Payer: Self-pay | Admitting: Family

## 2017-07-31 MED ORDER — EMPAGLIFLOZIN 25 MG PO TABS: 25.0000 mg | ORAL_TABLET | Freq: Every day | ORAL | 2 refills | Status: DC

## 2017-07-31 MED ORDER — METFORMIN HCL ER 750 MG PO TB24: 1500.0000 mg | ORAL_TABLET | Freq: Every day | ORAL | 2 refills | Status: DC

## 2017-08-16 ENCOUNTER — Other Ambulatory Visit: Payer: Self-pay | Admitting: Family

## 2017-08-26 ENCOUNTER — Encounter: Payer: Self-pay | Admitting: Family

## 2017-08-26 ENCOUNTER — Ambulatory Visit: Payer: BLUE CROSS/BLUE SHIELD | Admitting: Family

## 2017-08-26 VITALS — BP 121/72 | HR 86 | Temp 98.5°F | Ht 72.0 in | Wt 229.4 lb

## 2017-08-26 DIAGNOSIS — J0110 Acute frontal sinusitis, unspecified: Secondary | ICD-10-CM | POA: Diagnosis not present

## 2017-08-26 MED ORDER — AMOXICILLIN-POT CLAVULANATE 875-125 MG PO TABS: 1.0000 | ORAL_TABLET | Freq: Two times a day (BID) | ORAL | 0 refills | Status: DC

## 2017-08-26 MED ORDER — BENZONATATE 200 MG PO CAPS: 200.0000 mg | ORAL_CAPSULE | Freq: Three times a day (TID) | ORAL | 1 refills | Status: DC | PRN

## 2017-08-26 NOTE — Patient Instructions (Signed)

## 2017-08-26 NOTE — Progress Notes (Signed)
   Subjective:    Patient ID: Jason Rogers, male    DOB: [birth year], 62 y.o.   MRN: [id number]  Chief Complaint  Patient presents with  . Cough    with sinus drainage    Cough  This is a new problem. The current episode started more than 1 month ago. The problem has been waxing and waning. The problem occurs every few minutes. Associated symptoms include headaches and a sore throat. Pertinent negatives include no ear pain or shortness of breath.  Sinusitis  This is a new problem. The current episode started 1 to 4 weeks ago. The problem is unchanged. There has been no fever. The pain is moderate. Associated symptoms include congestion, coughing, headaches, a hoarse voice, sinus pressure, sneezing and a sore throat. Pertinent negatives include no ear pain, shortness of breath or swollen glands. Past treatments include oral decongestants. The treatment provided mild relief.      Review of Systems  HENT: Positive for congestion, hoarse voice, sinus pressure, sneezing and sore throat. Negative for ear pain.   Respiratory: Positive for cough. Negative for shortness of breath.   Neurological: Positive for headaches.  All other systems reviewed and are negative.      Objective:   Physical Exam  Constitutional: He is oriented to person, place, and time. He appears well-developed and well-nourished. No distress.  HENT:  Head: Normocephalic.  Right Ear: External ear normal.  Left Ear: External ear normal.  Nose: Rhinorrhea present. Right sinus exhibits frontal sinus tenderness. Left sinus exhibits frontal sinus tenderness.  Mouth/Throat: Posterior oropharyngeal erythema present.  Eyes: Pupils are equal, round, and reactive to light. Right eye exhibits no discharge. Left eye exhibits no discharge.  Neck: Normal range of motion. Neck supple. No thyromegaly present.  Cardiovascular: Normal rate, regular rhythm, normal heart sounds and intact distal pulses.  No murmur  heard. Pulmonary/Chest: Effort normal and breath sounds normal. No respiratory distress. He has no wheezes.  Abdominal: Soft. Bowel sounds are normal. He exhibits no distension. There is no tenderness.  Musculoskeletal: Normal range of motion. He exhibits no edema or tenderness.  Neurological: He is alert and oriented to person, place, and time. He has normal reflexes. No cranial nerve deficit.  Skin: Skin is warm and dry. No rash noted. No erythema.  Psychiatric: He has a normal mood and affect. His behavior is normal. Judgment and thought content normal.  Vitals reviewed.     BP 121/72   Pulse 86   Temp 98.5 F (36.9 C) (Oral)   Ht 6' (1.829 m)   Wt 229 lb 6.4 oz (104.1 kg)   BMI 31.11 kg/m      Assessment & Plan:  [place] was seen today for cough.  Diagnoses and all orders for this visit:  Acute frontal sinusitis, recurrence not specified -     amoxicillin-clavulanate (AUGMENTIN) 875-125 MG tablet; Take 1 tablet by mouth 2 (two) times daily. -     benzonatate (TESSALON) 200 MG capsule; Take 1 capsule (200 mg total) by mouth 3 (three) times daily as needed.  - Take meds as prescribed - Use a cool mist humidifier  -Use saline nose sprays frequently -Force fluids -For any cough or congestion  Use plain Mucinex- regular strength or max strength is fine -For fever or aces or pains- take tylenol or ibuprofen. -Throat lozenges if help -New toothbrush in 3 days   Evelina Dun, FNP

## 2017-09-02 ENCOUNTER — Encounter: Payer: Self-pay | Admitting: Family Medicine

## 2017-09-02 ENCOUNTER — Ambulatory Visit: Payer: BLUE CROSS/BLUE SHIELD | Admitting: Family Medicine

## 2017-09-02 VITALS — BP 142/79 | HR 81 | Temp 97.7°F | Ht 72.0 in | Wt 228.0 lb

## 2017-09-02 DIAGNOSIS — L600 Ingrowing nail: Secondary | ICD-10-CM

## 2017-09-02 DIAGNOSIS — L237 Allergic contact dermatitis due to plants, except food: Secondary | ICD-10-CM | POA: Diagnosis not present

## 2017-09-02 MED ORDER — PREDNISONE 20 MG PO TABS: ORAL_TABLET | ORAL | 0 refills | Status: DC

## 2017-09-02 MED ORDER — SULFAMETHOXAZOLE-TRIMETHOPRIM 800-160 MG PO TABS
1.0000 | ORAL_TABLET | Freq: Two times a day (BID) | ORAL | 0 refills | Status: DC
Start: 2017-09-02 — End: 2018-02-03

## 2017-09-02 MED ORDER — METHYLPREDNISOLONE ACETATE 80 MG/ML IJ SUSP
80.0000 mg | Freq: Once | INTRAMUSCULAR | Status: AC
Start: 2017-09-02 — End: 2017-09-02
  Administered 2017-09-02: 80 mg via INTRAMUSCULAR

## 2017-09-02 NOTE — Progress Notes (Signed)
BP (!) 142/79   Pulse 81   Temp 97.7 F (36.5 C) (Oral)   Ht 6' (1.829 m)   Wt 228 lb (103.4 kg)   BMI 30.92 kg/m    Subjective:    Patient ID: Jason Rogers, male    DOB: [birth year], 62 y.o.   MRN: [id number]  HPI: Jason Rogers is a 62 y.o. male presenting on 09/02/2017 for ? poison oak on arms   HPI Ingrown toenail Patient comes in today complaining of ingrown toenail that has been bothering him over the past week.  He has been using wedging and topical alcohol to try and help with it.  He denies any fevers or chills.  Is mainly red and warm on the medial aspect of his right great toenail.  Poison ivy rash Patient was in his backyard working on some trees and developed a poison ivy rash the next day.  This started yesterday.  He has been very pruritic and he feels like it spreading and worsening and is coming in for treatment for it.  He denies any fevers or chills or redness or warmth but just mainly has a rash on both of his forearms.  He denies having a rash anywhere else.  Relevant past medical, surgical, family and social history reviewed and updated as indicated. Interim medical history since our last visit reviewed. Allergies and medications reviewed and updated.  Review of Systems  Constitutional: Negative for chills and fever.  Respiratory: Negative for shortness of breath and wheezing.   Cardiovascular: Negative for chest pain and leg swelling.  Musculoskeletal: Negative for back pain and gait problem.  Skin: Positive for color change and rash.  All other systems reviewed and are negative.   Per HPI unless specifically indicated above   Allergies as of 09/02/2017      Reactions   Lipitor [atorvastatin]    Depressed      Medication List        Accurate as of 09/02/17  6:38 PM. Always use your most recent med list.          amoxicillin-clavulanate 875-125 MG tablet Commonly known as:  AUGMENTIN Take 1 tablet by mouth 2 (two) times daily.   aspirin  81 MG tablet Take 81 mg by mouth daily.   benzonatate 200 MG capsule Commonly known as:  TESSALON Take 1 capsule (200 mg total) by mouth 3 (three) times daily as needed.   empagliflozin 25 MG Tabs tablet Commonly known as:  JARDIANCE Take 25 mg by mouth daily.   lisinopril 20 MG tablet Commonly known as:  PRINIVIL,ZESTRIL TAKE 1 TABLET BY MOUTH ONCE DAILY   loratadine 10 MG tablet Commonly known as:  CLARITIN Take 10 mg by mouth daily.   metFORMIN 750 MG 24 hr tablet Commonly known as:  GLUCOPHAGE XR Take 2 tablets (1,500 mg total) by mouth daily with breakfast.   omeprazole 20 MG capsule Commonly known as:  PRILOSEC TAKE 1 CAPSULE BY MOUTH ONCE DAILY   predniSONE 20 MG tablet Commonly known as:  DELTASONE 2 po at same time daily for 5 days   simvastatin 10 MG tablet Commonly known as:  ZOCOR TAKE 1 TABLET BY MOUTH AT BEDTIME   sulfamethoxazole-trimethoprim 800-160 MG tablet Commonly known as:  BACTRIM DS,SEPTRA DS Take 1 tablet by mouth 2 (two) times daily.   TRULICITY 1.5 IR/6.7EL Sopn Generic drug:  Dulaglutide INJECT 1 SYRINGE SUBCUTANEOUSLY ONCE A WEEK  Objective:    BP (!) 142/79   Pulse 81   Temp 97.7 F (36.5 C) (Oral)   Ht 6' (1.829 m)   Wt 228 lb (103.4 kg)   BMI 30.92 kg/m   Wt Readings from Last 3 Encounters:  09/02/17 228 lb (103.4 kg)  08/26/17 229 lb 6.4 oz (104.1 kg)  07/26/17 229 lb (103.9 kg)    Physical Exam  Constitutional: He is oriented to person, place, and time. He appears well-developed and well-nourished. No distress.  Eyes: Conjunctivae are normal. No scleral icterus.  Neurological: He is alert and oriented to person, place, and time. Coordination normal.  Skin: Skin is warm and dry. Rash (Papular rash with excoriations on both of his anterior forearms consistent with contact dermatitis) noted. He is not diaphoretic. There is erythema (Erythema and swelling on the medial aspect of his right great toenail consistent  with ingrown toenail).  Psychiatric: He has a normal mood and affect. His behavior is normal.  Nursing note and vitals reviewed.       Assessment & Plan:   Problem List Items Addressed This Visit    None    Visit Diagnoses    Ingrown toenail of right foot    -  Primary   Relevant Medications   sulfamethoxazole-trimethoprim (BACTRIM DS,SEPTRA DS) 800-160 MG tablet   Poison ivy dermatitis       Relevant Medications   predniSONE (DELTASONE) 20 MG tablet   methylPREDNISolone acetate (DEPO-MEDROL) injection 80 mg (Start on 09/02/2017  6:45 PM)       Follow up plan: Return if symptoms worsen or fail to improve.  Counseling provided for all of the vaccine components No orders of the defined types were placed in this encounter.   Caryl Pina, MD [facility] 09/02/2017, 6:38 PM

## 2017-09-11 ENCOUNTER — Encounter: Payer: Self-pay | Admitting: Family Medicine

## 2017-09-11 ENCOUNTER — Ambulatory Visit: Payer: BLUE CROSS/BLUE SHIELD | Admitting: Family Medicine

## 2017-09-11 VITALS — BP 123/68 | HR 86 | Temp 98.7°F | Ht 72.0 in | Wt 227.0 lb

## 2017-09-11 DIAGNOSIS — L600 Ingrowing nail: Secondary | ICD-10-CM

## 2017-09-11 NOTE — Progress Notes (Signed)
BP 123/68   Pulse 86   Temp 98.7 F (37.1 C) (Oral)   Ht 6' (1.829 m)   Wt 227 lb (103 kg)   BMI 30.79 kg/m    Subjective:    Patient ID: Jason Rogers, male    DOB: 06-16-1955, 62 y.o.   MRN: [id number]  HPI: Jason Rogers is a 62 y.o. male presenting on 09/11/2017 for Toenail removal   HPI Ingrown toenail Patient is coming in for ingrown toenail for recheck today.  He says is just not doing better and he is ready to have that side of his toenail removed.  He said is the middle side of his right great toenail and the antibiotic did not help significantly.  He has never had this to this extent previously is always been able to treat it on his own at home.  He has been doing Epson salt soaks without much success.  Relevant past medical, surgical, family and social history reviewed and updated as indicated. Interim medical history since our last visit reviewed. Allergies and medications reviewed and updated.  Review of Systems  Constitutional: Negative for chills and fever.  Respiratory: Negative for shortness of breath and wheezing.   Cardiovascular: Negative for chest pain and leg swelling.  Musculoskeletal: Negative for back pain and gait problem.  Skin: Positive for color change. Negative for rash.  All other systems reviewed and are negative.   Per HPI unless specifically indicated above   Allergies as of 09/11/2017      Reactions   Lipitor [atorvastatin]    Depressed      Medication List        Accurate as of 09/11/17  4:12 PM. Always use your most recent med list.          aspirin 81 MG tablet Take 81 mg by mouth daily.   benzonatate 200 MG capsule Commonly known as:  TESSALON Take 1 capsule (200 mg total) by mouth 3 (three) times daily as needed.   empagliflozin 25 MG Tabs tablet Commonly known as:  JARDIANCE Take 25 mg by mouth daily.   lisinopril 20 MG tablet Commonly known as:  PRINIVIL,ZESTRIL TAKE 1 TABLET BY MOUTH ONCE DAILY   loratadine  10 MG tablet Commonly known as:  CLARITIN Take 10 mg by mouth daily.   metFORMIN 750 MG 24 hr tablet Commonly known as:  GLUCOPHAGE XR Take 2 tablets (1,500 mg total) by mouth daily with breakfast.   omeprazole 20 MG capsule Commonly known as:  PRILOSEC TAKE 1 CAPSULE BY MOUTH ONCE DAILY   predniSONE 20 MG tablet Commonly known as:  DELTASONE 2 po at same time daily for 5 days   simvastatin 10 MG tablet Commonly known as:  ZOCOR TAKE 1 TABLET BY MOUTH AT BEDTIME   sulfamethoxazole-trimethoprim 800-160 MG tablet Commonly known as:  BACTRIM DS,SEPTRA DS Take 1 tablet by mouth 2 (two) times daily.   TRULICITY 1.5 NW/2.9FA Sopn Generic drug:  Dulaglutide INJECT 1 SYRINGE SUBCUTANEOUSLY ONCE A WEEK          Objective:    BP 123/68   Pulse 86   Temp 98.7 F (37.1 C) (Oral)   Ht 6' (1.829 m)   Wt 227 lb (103 kg)   BMI 30.79 kg/m   Wt Readings from Last 3 Encounters:  09/11/17 227 lb (103 kg)  09/02/17 228 lb (103.4 kg)  08/26/17 229 lb 6.4 oz (104.1 kg)    Physical Exam  Constitutional: He is  oriented to person, place, and time. He appears well-developed and well-nourished. No distress.  Eyes: Conjunctivae are normal. No scleral icterus.  Neurological: He is alert and oriented to person, place, and time. Coordination normal.  Skin: Skin is warm and dry. No rash noted. He is not diaphoretic. There is erythema (Erythema and slight tenderness of the medial aspect of his right great toe near the toenail).  Psychiatric: He has a normal mood and affect. His behavior is normal.  Nursing note and vitals reviewed.   Partial Nail removal of medial right great toenail: Betadine used for prep. 2% lidocaine with epinephrine was used for digital block, 71mL. Rubber band used for tourniquet to remove blood flow and decreased bleeding. Elevator was used to lift the medial nail bed and then forceps used to remove it from the base.  Medial nail plate removed easily and probe was used  to remove any leftover debris from nail bed. Topical antibiotic was used and then it was covered by 4 x 4 and tape told in place. Procedure was tolerated well with minimal bleeding.     Assessment & Plan:   Problem List Items Addressed This Visit    None    Visit Diagnoses    Ingrown toenail of right foot    -  Primary       Follow up plan: Return if symptoms worsen or fail to improve.  Counseling provided for all of the vaccine components No orders of the defined types were placed in this encounter.   Caryl Pina, MD [facility] 09/11/2017, 4:12 PM

## 2017-09-27 DIAGNOSIS — E119 Type 2 diabetes mellitus without complications: Secondary | ICD-10-CM | POA: Diagnosis not present

## 2017-09-27 DIAGNOSIS — H524 Presbyopia: Secondary | ICD-10-CM | POA: Diagnosis not present

## 2017-09-27 LAB — HM DIABETES EYE EXAM

## 2017-10-13 ENCOUNTER — Other Ambulatory Visit: Payer: Self-pay | Admitting: Family

## 2017-10-25 ENCOUNTER — Other Ambulatory Visit: Payer: Self-pay | Admitting: *Deleted

## 2017-10-25 DIAGNOSIS — E785 Hyperlipidemia, unspecified: Secondary | ICD-10-CM

## 2017-10-25 MED ORDER — SIMVASTATIN 10 MG PO TABS: 10.0000 mg | ORAL_TABLET | Freq: Every day | ORAL | 0 refills | Status: DC

## 2017-12-05 ENCOUNTER — Ambulatory Visit (INDEPENDENT_AMBULATORY_CARE_PROVIDER_SITE_OTHER): Payer: BLUE CROSS/BLUE SHIELD

## 2017-12-05 DIAGNOSIS — Z23 Encounter for immunization: Secondary | ICD-10-CM

## 2018-01-02 ENCOUNTER — Telehealth: Payer: Self-pay

## 2018-01-02 ENCOUNTER — Telehealth: Payer: Self-pay | Admitting: Family

## 2018-01-02 ENCOUNTER — Encounter: Payer: Self-pay | Admitting: *Deleted

## 2018-01-02 DIAGNOSIS — E785 Hyperlipidemia, unspecified: Secondary | ICD-10-CM

## 2018-01-02 MED ORDER — OMEPRAZOLE 20 MG PO CPDR: 20.0000 mg | DELAYED_RELEASE_CAPSULE | Freq: Every day | ORAL | 0 refills | Status: DC

## 2018-01-02 MED ORDER — METFORMIN HCL ER 750 MG PO TB24: 1500.0000 mg | ORAL_TABLET | Freq: Every day | ORAL | 0 refills | Status: DC

## 2018-01-02 MED ORDER — SIMVASTATIN 10 MG PO TABS: 10.0000 mg | ORAL_TABLET | Freq: Every day | ORAL | 0 refills | Status: DC

## 2018-01-02 MED ORDER — DULAGLUTIDE 1.5 MG/0.5ML ~~LOC~~ SOAJ: SUBCUTANEOUS | 0 refills | Status: DC

## 2018-01-02 MED ORDER — LISINOPRIL 20 MG PO TABS: 20.0000 mg | ORAL_TABLET | Freq: Every day | ORAL | 0 refills | Status: DC

## 2018-01-02 MED ORDER — EMPAGLIFLOZIN 25 MG PO TABS: 25.0000 mg | ORAL_TABLET | Freq: Every day | ORAL | 0 refills | Status: DC

## 2018-01-02 NOTE — Telephone Encounter (Signed)
Pt called needed script refill to hold him until his new appointment in Jan

## 2018-01-14 ENCOUNTER — Other Ambulatory Visit: Payer: Self-pay | Admitting: Family

## 2018-01-14 DIAGNOSIS — K219 Gastro-esophageal reflux disease without esophagitis: Secondary | ICD-10-CM

## 2018-01-14 DIAGNOSIS — I10 Essential (primary) hypertension: Principal | ICD-10-CM

## 2018-01-14 DIAGNOSIS — E785 Hyperlipidemia, unspecified: Secondary | ICD-10-CM

## 2018-01-14 DIAGNOSIS — E1169 Type 2 diabetes mellitus with other specified complication: Secondary | ICD-10-CM

## 2018-01-14 DIAGNOSIS — I152 Hypertension secondary to endocrine disorders: Secondary | ICD-10-CM

## 2018-01-14 DIAGNOSIS — E1159 Type 2 diabetes mellitus with other circulatory complications: Secondary | ICD-10-CM

## 2018-01-14 DIAGNOSIS — E1165 Type 2 diabetes mellitus with hyperglycemia: Secondary | ICD-10-CM

## 2018-01-30 ENCOUNTER — Ambulatory Visit: Payer: BLUE CROSS/BLUE SHIELD | Admitting: Family

## 2018-01-31 ENCOUNTER — Other Ambulatory Visit: Payer: BLUE CROSS/BLUE SHIELD

## 2018-01-31 ENCOUNTER — Ambulatory Visit: Payer: BLUE CROSS/BLUE SHIELD | Admitting: Family

## 2018-01-31 DIAGNOSIS — E1169 Type 2 diabetes mellitus with other specified complication: Secondary | ICD-10-CM

## 2018-01-31 DIAGNOSIS — I152 Hypertension secondary to endocrine disorders: Secondary | ICD-10-CM

## 2018-01-31 DIAGNOSIS — I10 Essential (primary) hypertension: Secondary | ICD-10-CM | POA: Diagnosis not present

## 2018-01-31 DIAGNOSIS — E785 Hyperlipidemia, unspecified: Secondary | ICD-10-CM | POA: Diagnosis not present

## 2018-01-31 DIAGNOSIS — Z0000 Encounter for general adult medical examination without abnormal findings: Secondary | ICD-10-CM

## 2018-01-31 DIAGNOSIS — E1159 Type 2 diabetes mellitus with other circulatory complications: Secondary | ICD-10-CM

## 2018-01-31 DIAGNOSIS — E1165 Type 2 diabetes mellitus with hyperglycemia: Secondary | ICD-10-CM

## 2018-01-31 DIAGNOSIS — K219 Gastro-esophageal reflux disease without esophagitis: Secondary | ICD-10-CM

## 2018-01-31 LAB — BAYER DCA HB A1C WAIVED: HB A1C (BAYER DCA - WAIVED): 8.4 % — ABNORMAL HIGH (ref <7.0)

## 2018-02-01 LAB — CBC WITH DIFFERENTIAL/PLATELET
BASOS: 1 %
Basophils Absolute: 0.1 10*3/uL (ref 0.0–0.2)
EOS (ABSOLUTE): 0.2 10*3/uL (ref 0.0–0.4)
EOS: 2 %
Hematocrit: 38.5 % (ref 37.5–51.0)
Hemoglobin: 12.2 g/dL — ABNORMAL LOW (ref 13.0–17.7)
IMMATURE GRANULOCYTES: 0 %
Immature Grans (Abs): 0 10*3/uL (ref 0.0–0.1)
LYMPHS ABS: 1.9 10*3/uL (ref 0.7–3.1)
Lymphs: 25 %
MCH: 21.9 pg — AB (ref 26.6–33.0)
MCHC: 31.7 g/dL (ref 31.5–35.7)
MCV: 69 fL — AB (ref 79–97)
MONOS ABS: 0.7 10*3/uL (ref 0.1–0.9)
Monocytes: 10 %
Neutrophils Absolute: 4.5 10*3/uL (ref 1.4–7.0)
Neutrophils: 62 %
Platelets: 256 10*3/uL (ref 150–450)
RBC: 5.58 x10E6/uL (ref 4.14–5.80)
RDW: 16.1 % — ABNORMAL HIGH (ref 12.3–15.4)
WBC: 7.3 10*3/uL (ref 3.4–10.8)

## 2018-02-01 LAB — CMP14+EGFR
A/G RATIO: 2 (ref 1.2–2.2)
ALBUMIN: 4.3 g/dL (ref 3.6–4.8)
ALT: 20 IU/L (ref 0–44)
AST: 21 IU/L (ref 0–40)
Alkaline Phosphatase: 58 IU/L (ref 39–117)
BUN / CREAT RATIO: 21 (ref 10–24)
BUN: 21 mg/dL (ref 8–27)
Bilirubin Total: 0.9 mg/dL (ref 0.0–1.2)
CALCIUM: 9.3 mg/dL (ref 8.6–10.2)
CO2: 23 mmol/L (ref 20–29)
Chloride: 101 mmol/L (ref 96–106)
Creatinine, Ser: 0.98 mg/dL (ref 0.76–1.27)
GFR calc Af Amer: 94 mL/min/{1.73_m2} (ref >59)
GFR, EST NON AFRICAN AMERICAN: 82 mL/min/{1.73_m2} (ref >59)
GLOBULIN, TOTAL: 2.1 g/dL (ref 1.5–4.5)
Glucose: 143 mg/dL — ABNORMAL HIGH (ref 65–99)
POTASSIUM: 4.5 mmol/L (ref 3.5–5.2)
SODIUM: 139 mmol/L (ref 134–144)
Total Protein: 6.4 g/dL (ref 6.0–8.5)

## 2018-02-01 LAB — LIPID PANEL
CHOLESTEROL TOTAL: 168 mg/dL (ref 100–199)
Chol/HDL Ratio: 3.8 ratio (ref 0.0–5.0)
HDL: 44 mg/dL (ref >39)
LDL CALC: 87 mg/dL (ref 0–99)
TRIGLYCERIDES: 185 mg/dL — AB (ref 0–149)
VLDL Cholesterol Cal: 37 mg/dL (ref 5–40)

## 2018-02-01 LAB — VITAMIN D 25 HYDROXY (VIT D DEFICIENCY, FRACTURES): Vit D, 25-Hydroxy: 26.9 ng/mL — ABNORMAL LOW (ref 30.0–100.0)

## 2018-02-03 ENCOUNTER — Other Ambulatory Visit: Payer: Self-pay | Admitting: Family

## 2018-02-03 MED ORDER — SIMVASTATIN 20 MG PO TABS: 20.0000 mg | ORAL_TABLET | Freq: Every evening | ORAL | 11 refills | Status: DC

## 2018-02-04 ENCOUNTER — Other Ambulatory Visit: Payer: Self-pay | Admitting: Family

## 2018-02-04 MED ORDER — INSULIN DEGLUDEC 100 UNIT/ML ~~LOC~~ SOPN: 8.0000 [IU] | PEN_INJECTOR | Freq: Every day | SUBCUTANEOUS | 0 refills | Status: DC

## 2018-03-07 ENCOUNTER — Encounter: Payer: Self-pay | Admitting: Family

## 2018-03-07 ENCOUNTER — Ambulatory Visit: Payer: BLUE CROSS/BLUE SHIELD | Admitting: Family

## 2018-03-07 VITALS — BP 138/77 | HR 70 | Temp 97.8°F | Ht 72.0 in | Wt 212.0 lb

## 2018-03-07 DIAGNOSIS — E785 Hyperlipidemia, unspecified: Secondary | ICD-10-CM

## 2018-03-07 DIAGNOSIS — E1159 Type 2 diabetes mellitus with other circulatory complications: Secondary | ICD-10-CM | POA: Diagnosis not present

## 2018-03-07 DIAGNOSIS — E1165 Type 2 diabetes mellitus with hyperglycemia: Secondary | ICD-10-CM | POA: Diagnosis not present

## 2018-03-07 DIAGNOSIS — E1169 Type 2 diabetes mellitus with other specified complication: Secondary | ICD-10-CM

## 2018-03-07 DIAGNOSIS — E663 Overweight: Secondary | ICD-10-CM

## 2018-03-07 DIAGNOSIS — K219 Gastro-esophageal reflux disease without esophagitis: Secondary | ICD-10-CM

## 2018-03-07 DIAGNOSIS — I152 Hypertension secondary to endocrine disorders: Secondary | ICD-10-CM

## 2018-03-07 DIAGNOSIS — J0100 Acute maxillary sinusitis, unspecified: Secondary | ICD-10-CM

## 2018-03-07 DIAGNOSIS — I10 Essential (primary) hypertension: Secondary | ICD-10-CM

## 2018-03-07 MED ORDER — AMOXICILLIN-POT CLAVULANATE 875-125 MG PO TABS: 1.0000 | ORAL_TABLET | Freq: Two times a day (BID) | ORAL | 0 refills | Status: DC

## 2018-03-07 NOTE — Patient Instructions (Signed)
Sinusitis, Adult  Sinusitis is inflammation of your sinuses. Sinuses are hollow spaces in the bones around your face. Your sinuses are located:   Around your eyes.   In the middle of your forehead.   Behind your nose.   In your cheekbones.  Mucus normally drains out of your sinuses. When your nasal tissues become inflamed or swollen, mucus can become trapped or blocked. This allows bacteria, viruses, and fungi to grow, which leads to infection. Most infections of the sinuses are caused by a virus.  Sinusitis can develop quickly. It can last for up to 4 weeks (acute) or for more than 12 weeks (chronic). Sinusitis often develops after a cold.  What are the causes?  This condition is caused by anything that creates swelling in the sinuses or stops mucus from draining. This includes:   Allergies.   Asthma.   Infection from bacteria or viruses.   Deformities or blockages in your nose or sinuses.   Abnormal growths in the nose (nasal polyps).   Pollutants, such as chemicals or irritants in the air.   Infection from fungi (rare).  What increases the risk?  You are more likely to develop this condition if you:   Have a weak body defense system (immune system).   Do a lot of swimming or diving.   Overuse nasal sprays.   Smoke.  What are the signs or symptoms?  The main symptoms of this condition are pain and a feeling of pressure around the affected sinuses. Other symptoms include:   Stuffy nose or congestion.   Thick drainage from your nose.   Swelling and warmth over the affected sinuses.   Headache.   Upper toothache.   A cough that may get worse at night.   Extra mucus that collects in the throat or the back of the nose (postnasal drip).   Decreased sense of smell and taste.   Fatigue.   A fever.   Sore throat.   Bad breath.  How is this diagnosed?  This condition is diagnosed based on:   Your symptoms.   Your medical history.   A physical exam.   Tests to find out if your condition is  acute or chronic. This may include:  ? Checking your nose for nasal polyps.  ? Viewing your sinuses using a device that has a light (endoscope).  ? Testing for allergies or bacteria.  ? Imaging tests, such as an MRI or CT scan.  In rare cases, a bone biopsy may be done to rule out more serious types of fungal sinus disease.  How is this treated?  Treatment for sinusitis depends on the cause and whether your condition is chronic or acute.   If caused by a virus, your symptoms should go away on their own within 10 days. You may be given medicines to relieve symptoms. They include:  ? Medicines that shrink swollen nasal passages (topical intranasal decongestants).  ? Medicines that treat allergies (antihistamines).  ? A spray that eases inflammation of the nostrils (topical intranasal corticosteroids).  ? Rinses that help get rid of thick mucus in your nose (nasal saline washes).   If caused by bacteria, your health care provider may recommend waiting to see if your symptoms improve. Most bacterial infections will get better without antibiotic medicine. You may be given antibiotics if you have:  ? A severe infection.  ? A weak immune system.   If caused by narrow nasal passages or nasal polyps, you may need   to have surgery.  Follow these instructions at home:  Medicines   Take, use, or apply over-the-counter and prescription medicines only as told by your health care provider. These may include nasal sprays.   If you were prescribed an antibiotic medicine, take it as told by your health care provider. Do not stop taking the antibiotic even if you start to feel better.  Hydrate and humidify     Drink enough fluid to keep your urine pale yellow. Staying hydrated will help to thin your mucus.   Use a cool mist humidifier to keep the humidity level in your home above 50%.   Inhale steam for 10-15 minutes, 3-4 times a day, or as told by your health care provider. You can do this in the bathroom while a hot shower is  running.   Limit your exposure to cool or dry air.  Rest   Rest as much as possible.   Sleep with your head raised (elevated).   Make sure you get enough sleep each night.  General instructions     Apply a warm, moist washcloth to your face 3-4 times a day or as told by your health care provider. This will help with discomfort.   Wash your hands often with soap and water to reduce your exposure to germs. If soap and water are not available, use hand sanitizer.   Do not smoke. Avoid being around people who are smoking (secondhand smoke).   Keep all follow-up visits as told by your health care provider. This is important.  Contact a health care provider if:   You have a fever.   Your symptoms get worse.   Your symptoms do not improve within 10 days.  Get help right away if:   You have a severe headache.   You have persistent vomiting.   You have severe pain or swelling around your face or eyes.   You have vision problems.   You develop confusion.   Your neck is stiff.   You have trouble breathing.  Summary   Sinusitis is soreness and inflammation of your sinuses. Sinuses are hollow spaces in the bones around your face.   This condition is caused by nasal tissues that become inflamed or swollen. The swelling traps or blocks the flow of mucus. This allows bacteria, viruses, and fungi to grow, which leads to infection.   If you were prescribed an antibiotic medicine, take it as told by your health care provider. Do not stop taking the antibiotic even if you start to feel better.   Keep all follow-up visits as told by your health care provider. This is important.  This information is not intended to replace advice given to you by your health care provider. Make sure you discuss any questions you have with your health care provider.  Document Released: 02/12/2005 Document Revised: 07/15/2017 Document Reviewed: 07/15/2017  [employer] Interactive Patient Education  2019 [employer]

## 2018-03-07 NOTE — Progress Notes (Signed)
Subjective:    Patient ID: Jason Rogers, male    DOB: 04/23/1955, 63 y.o.   MRN: [id number]  Chief Complaint  Patient presents with  . Medical Management of Chronic Issues    six month recheck   Pt presents to the office today for chronic follow up. PT states while he was working he tore his bicep tendon Monday. He is scheduled for surgery in within this week to repair this.   His A1C was elevated and he started [place]. His blood glucose for the last 2-3 days have been 140.'s  Hypertension  This is a chronic problem. The current episode started more than 1 year ago. The problem has been resolved since onset. The problem is controlled. Associated symptoms include headaches. Pertinent negatives include no peripheral edema. Risk factors for coronary artery disease include diabetes mellitus, dyslipidemia and male gender. The current treatment provides moderate improvement. There is no history of CAD/MI, CVA or heart failure.  Gastroesophageal Reflux  He complains of coughing, a hoarse voice and a sore throat. He reports no belching. This is a chronic problem. The current episode started more than 1 year ago. The problem occurs occasionally. The problem has been waxing and waning. Associated symptoms include fatigue. He has tried a PPI for the symptoms. The treatment provided moderate relief.  Hyperlipidemia  This is a chronic problem. The current episode started more than 1 year ago. The problem is controlled. Recent lipid tests were reviewed and are normal. Exacerbating diseases include obesity. Current antihyperlipidemic treatment includes statins. The current treatment provides moderate improvement of lipids. Risk factors for coronary artery disease include diabetes mellitus, dyslipidemia, hypertension, male sex and a sedentary lifestyle.  Diabetes  He presents for his follow-up diabetic visit. He has type 2 diabetes mellitus. His disease course has been stable. Hypoglycemia symptoms include  headaches. Associated symptoms include fatigue. There are no hypoglycemic complications. Symptoms are stable. Pertinent negatives for diabetic complications include no CVA. Risk factors for coronary artery disease include dyslipidemia, diabetes mellitus, male sex, hypertension and sedentary lifestyle. He is following a generally unhealthy diet. His overall blood glucose range is 180-200 mg/dl.  Sinusitis  This is a new problem. The current episode started 1 to 4 weeks ago. The problem has been waxing and waning since onset. There has been no fever. His pain is at a severity of 4/10. The pain is mild. Associated symptoms include congestion, coughing, headaches, a hoarse voice, sinus pressure, sneezing and a sore throat. Past treatments include oral decongestants. The treatment provided mild relief.      Review of Systems  Constitutional: Positive for fatigue.  HENT: Positive for congestion, hoarse voice, sinus pressure, sneezing and sore throat.   Respiratory: Positive for cough.   Neurological: Positive for headaches.  All other systems reviewed and are negative.      Objective:   Physical Exam Vitals signs reviewed.  Constitutional:      General: He is not in acute distress.    Appearance: He is well-developed.  HENT:     Head: Normocephalic.     Right Ear: External ear normal.     Left Ear: External ear normal.  Eyes:     General:        Right eye: No discharge.        Left eye: No discharge.     Pupils: Pupils are equal, round, and reactive to light.  Neck:     Musculoskeletal: Normal range of motion and neck supple.  Thyroid: No thyromegaly.  Cardiovascular:     Rate and Rhythm: Normal rate and regular rhythm.     Heart sounds: Normal heart sounds. No murmur.  Pulmonary:     Effort: Pulmonary effort is normal. No respiratory distress.     Breath sounds: Normal breath sounds. No wheezing.  Abdominal:     General: Bowel sounds are normal. There is no distension.      Palpations: Abdomen is soft.     Tenderness: There is no abdominal tenderness.  Musculoskeletal:        General: No tenderness.     Comments: Right bicep tenderness with flexion, weakness present  Skin:    General: Skin is warm and dry.     Findings: No erythema or rash.  Neurological:     Mental Status: He is alert and oriented to person, place, and time.     Cranial Nerves: No cranial nerve deficit.     Deep Tendon Reflexes: Reflexes are normal and symmetric.  Psychiatric:        Behavior: Behavior normal.        Thought Content: Thought content normal.        Judgment: Judgment normal.       BP 138/77   Pulse 70   Temp 97.8 F (36.6 C) (Oral)   Ht 6' (1.829 m)   Wt 212 lb (96.2 kg)   BMI 28.75 kg/m      Assessment & Plan:  Jason Rogers comes in today with chief complaint of Medical Management of Chronic Issues (six month recheck)   Diagnosis and orders addressed:  1. Hypertension associated with diabetes ([place])  2. Gastroesophageal reflux disease, esophagitis presence not specified  3. Overweight (BMI 25.0-29.9)  4. Type 2 diabetes mellitus with hyperglycemia, without long-term current use of insulin (HCC) - Microalbumin / creatinine urine ratio  5. Hyperlipidemia associated with type 2 diabetes mellitus ([place])  6. Acute maxillary sinusitis, recurrence not specified - Take meds as prescribed - Use a cool mist humidifier  -Use saline nose sprays frequently -Force fluids -For any cough or congestion  Use plain Mucinex- regular strength or max strength is fine -For fever or aces or pains- take tylenol or ibuprofen. -Throat lozenges if help  - amoxicillin-clavulanate (AUGMENTIN) 875-125 MG tablet; Take 1 tablet by mouth 2 (two) times daily.  Dispense: 14 tablet; Refill: 0   Labs reviewed Health Maintenance reviewed Diet and exercise encouraged  Follow up plan: 3 months   Evelina Dun, FNP

## 2018-03-08 LAB — MICROALBUMIN / CREATININE URINE RATIO: Creatinine, Urine: 88.3 mg/dL

## 2018-04-06 ENCOUNTER — Other Ambulatory Visit: Payer: Self-pay | Admitting: Family

## 2018-05-24 ENCOUNTER — Other Ambulatory Visit: Payer: Self-pay | Admitting: Family

## 2018-05-24 DIAGNOSIS — I10 Essential (primary) hypertension: Secondary | ICD-10-CM

## 2018-06-09 ENCOUNTER — Other Ambulatory Visit: Payer: Self-pay

## 2018-06-13 ENCOUNTER — Other Ambulatory Visit: Payer: Self-pay | Admitting: *Deleted

## 2018-06-13 ENCOUNTER — Other Ambulatory Visit: Payer: Self-pay

## 2018-06-13 ENCOUNTER — Other Ambulatory Visit: Payer: BLUE CROSS/BLUE SHIELD

## 2018-06-13 ENCOUNTER — Ambulatory Visit: Payer: BLUE CROSS/BLUE SHIELD | Admitting: Family

## 2018-06-13 DIAGNOSIS — I152 Hypertension secondary to endocrine disorders: Secondary | ICD-10-CM

## 2018-06-13 DIAGNOSIS — I10 Essential (primary) hypertension: Principal | ICD-10-CM

## 2018-06-13 DIAGNOSIS — E785 Hyperlipidemia, unspecified: Secondary | ICD-10-CM

## 2018-06-13 DIAGNOSIS — E1159 Type 2 diabetes mellitus with other circulatory complications: Secondary | ICD-10-CM

## 2018-06-13 DIAGNOSIS — E1165 Type 2 diabetes mellitus with hyperglycemia: Secondary | ICD-10-CM

## 2018-06-13 DIAGNOSIS — E1169 Type 2 diabetes mellitus with other specified complication: Secondary | ICD-10-CM

## 2018-06-13 LAB — CBC WITH DIFFERENTIAL/PLATELET
Basophils Absolute: 0.1 10*3/uL (ref 0.0–0.2)
Basos: 1 %
EOS (ABSOLUTE): 0.1 10*3/uL (ref 0.0–0.4)
Eos: 2 %
Hematocrit: 39.3 % (ref 37.5–51.0)
Hemoglobin: 12.3 g/dL — ABNORMAL LOW (ref 13.0–17.7)
Immature Grans (Abs): 0 10*3/uL (ref 0.0–0.1)
Immature Granulocytes: 0 %
Lymphocytes Absolute: 1.8 10*3/uL (ref 0.7–3.1)
Lymphs: 26 %
MCH: 22.5 pg — ABNORMAL LOW (ref 26.6–33.0)
MCHC: 31.3 g/dL — ABNORMAL LOW (ref 31.5–35.7)
MCV: 72 fL — ABNORMAL LOW (ref 79–97)
Monocytes Absolute: 0.6 10*3/uL (ref 0.1–0.9)
Monocytes: 8 %
Neutrophils Absolute: 4.5 10*3/uL (ref 1.4–7.0)
Neutrophils: 63 %
Platelets: 258 10*3/uL (ref 150–450)
RBC: 5.46 x10E6/uL (ref 4.14–5.80)
RDW: 17.9 % — ABNORMAL HIGH (ref 11.6–15.4)
WBC: 7.2 10*3/uL (ref 3.4–10.8)

## 2018-06-13 LAB — CMP14+EGFR
ALT: 24 IU/L (ref 0–44)
AST: 19 IU/L (ref 0–40)
Albumin/Globulin Ratio: 1.9 (ref 1.2–2.2)
Albumin: 4.2 g/dL (ref 3.8–4.8)
Alkaline Phosphatase: 66 IU/L (ref 39–117)
BUN/Creatinine Ratio: 17 (ref 10–24)
BUN: 18 mg/dL (ref 8–27)
Bilirubin Total: 0.7 mg/dL (ref 0.0–1.2)
CO2: 23 mmol/L (ref 20–29)
Calcium: 9.3 mg/dL (ref 8.6–10.2)
Chloride: 101 mmol/L (ref 96–106)
Creatinine, Ser: 1.05 mg/dL (ref 0.76–1.27)
GFR calc Af Amer: 86 mL/min/{1.73_m2} (ref >59)
GFR calc non Af Amer: 75 mL/min/{1.73_m2} (ref >59)
Globulin, Total: 2.2 g/dL (ref 1.5–4.5)
Glucose: 158 mg/dL — ABNORMAL HIGH (ref 65–99)
Potassium: 4.4 mmol/L (ref 3.5–5.2)
Sodium: 139 mmol/L (ref 134–144)
Total Protein: 6.4 g/dL (ref 6.0–8.5)

## 2018-06-13 LAB — LIPID PANEL
Chol/HDL Ratio: 4.6 ratio (ref 0.0–5.0)
Cholesterol, Total: 167 mg/dL (ref 100–199)
HDL: 36 mg/dL — ABNORMAL LOW (ref >39)
LDL Calculated: 73 mg/dL (ref 0–99)
Triglycerides: 290 mg/dL — ABNORMAL HIGH (ref 0–149)
VLDL Cholesterol Cal: 58 mg/dL — ABNORMAL HIGH (ref 5–40)

## 2018-06-13 LAB — BAYER DCA HB A1C WAIVED: HB A1C (BAYER DCA - WAIVED): 8.9 % — ABNORMAL HIGH (ref <7.0)

## 2018-06-23 ENCOUNTER — Other Ambulatory Visit: Payer: Self-pay

## 2018-06-23 ENCOUNTER — Ambulatory Visit (INDEPENDENT_AMBULATORY_CARE_PROVIDER_SITE_OTHER): Payer: BLUE CROSS/BLUE SHIELD | Admitting: Family

## 2018-06-23 ENCOUNTER — Encounter: Payer: Self-pay | Admitting: Family

## 2018-06-23 DIAGNOSIS — E1169 Type 2 diabetes mellitus with other specified complication: Secondary | ICD-10-CM

## 2018-06-23 DIAGNOSIS — E1165 Type 2 diabetes mellitus with hyperglycemia: Secondary | ICD-10-CM | POA: Diagnosis not present

## 2018-06-23 DIAGNOSIS — E663 Overweight: Secondary | ICD-10-CM

## 2018-06-23 DIAGNOSIS — E1159 Type 2 diabetes mellitus with other circulatory complications: Secondary | ICD-10-CM

## 2018-06-23 DIAGNOSIS — I152 Hypertension secondary to endocrine disorders: Secondary | ICD-10-CM

## 2018-06-23 DIAGNOSIS — I10 Essential (primary) hypertension: Secondary | ICD-10-CM

## 2018-06-23 DIAGNOSIS — K219 Gastro-esophageal reflux disease without esophagitis: Secondary | ICD-10-CM

## 2018-06-23 DIAGNOSIS — E785 Hyperlipidemia, unspecified: Secondary | ICD-10-CM

## 2018-06-23 MED ORDER — INSULIN DEGLUDEC 100 UNIT/ML ~~LOC~~ SOPN: 13.0000 [IU] | PEN_INJECTOR | Freq: Every day | SUBCUTANEOUS | 2 refills | Status: DC

## 2018-06-23 NOTE — Progress Notes (Signed)
Virtual Visit via telephone Note  I connected with Jason Rogers on 06/23/18 at 8:24 AM by telephone and verified that I am speaking with the correct person using two identifiers. Jason Rogers is currently located at home and wife is currently with her during visit. The provider, Evelina Dun, FNP is located in their office at time of visit.  I discussed the limitations, risks, security and privacy concerns of performing an evaluation and management service by telephone and the availability of in person appointments. I also discussed with the patient that there may be a patient responsible charge related to this service. The patient expressed understanding and agreed to proceed.   History and Present Illness:   PT presents to the office today for chronic follow up.  Diabetes  He presents for his follow-up diabetic visit. He has type 2 diabetes mellitus. His disease course has been worsening. There are no hypoglycemic associated symptoms. Pertinent negatives for diabetes include no blurred vision, no foot paresthesias and no visual change. There are no hypoglycemic complications. Symptoms are stable. Pertinent negatives for diabetic complications include no CVA, heart disease, nephropathy or peripheral neuropathy. Risk factors for coronary artery disease include dyslipidemia, diabetes mellitus, male sex, hypertension and sedentary lifestyle. His weight is stable. He is following a generally unhealthy diet. His overall blood glucose range is 140-180 mg/dl.  Hypertension  This is a chronic problem. The current episode started more than 1 year ago. The problem has been waxing and waning since onset. The problem is controlled. Pertinent negatives include no blurred vision, peripheral edema or shortness of breath. Risk factors for coronary artery disease include dyslipidemia, obesity and male gender. The current treatment provides moderate improvement. There is no history of CVA.  Gastroesophageal  Reflux  He reports no belching, no choking or no heartburn. This is a chronic problem. The current episode started more than 1 year ago. The problem occurs occasionally. Risk factors include obesity. He has tried a PPI for the symptoms. The treatment provided moderate relief.  Hyperlipidemia  The problem is uncontrolled. Exacerbating diseases include obesity. Pertinent negatives include no shortness of breath. Current antihyperlipidemic treatment includes statins. The current treatment provides moderate improvement of lipids. Risk factors for coronary artery disease include dyslipidemia, diabetes mellitus, male sex, hypertension and a sedentary lifestyle.      Review of Systems  Eyes: Negative for blurred vision.  Respiratory: Negative for choking and shortness of breath.   Gastrointestinal: Negative for heartburn.    Observations/Objective: No SOB or distress   Assessment and Plan: ELIZER BOSTIC comes in today with chief complaint of No chief complaint on file.   Diagnosis and orders addressed:  1. Type 2 diabetes mellitus with hyperglycemia, without long-term current use of insulin (HCC) Will increase Tresiba to 13 units from 10 units Low carb diet - insulin degludec (TRESIBA FLEXTOUCH) 100 UNIT/ML SOPN FlexTouch Pen; Inject 0.13 mLs (13 Units total) into the skin at bedtime.  Dispense: 12 pen; Refill: 2  2. Gastroesophageal reflux disease, esophagitis presence not specified  3. Hyperlipidemia associated with type 2 diabetes mellitus ([place])  4. Hypertension associated with diabetes ([place])  5. Overweight (BMI 25.0-29.9)   Labs pending Health Maintenance reviewed Diet and exercise encouraged  Follow up plan: 3 months       I discussed the assessment and treatment plan with the patient. The patient was provided an opportunity to ask questions and all were answered. The patient agreed with the plan and demonstrated an understanding  of the instructions.   The patient was  advised to call back or seek an in-person evaluation if the symptoms worsen or if the condition fails to improve as anticipated.  The above assessment and management plan was discussed with the patient. The patient verbalized understanding of and has agreed to the management plan. Patient is aware to call the clinic if symptoms persist or worsen. Patient is aware when to return to the clinic for a follow-up visit. Patient educated on when it is appropriate to go to the emergency department.    Call ended 8:43 AM, I provided 21 minutes of non-face-to-face time during this encounter.    Evelina Dun, FNP

## 2018-07-13 ENCOUNTER — Other Ambulatory Visit: Payer: Self-pay | Admitting: Family

## 2018-07-31 ENCOUNTER — Other Ambulatory Visit: Payer: Self-pay | Admitting: Family

## 2018-08-24 ENCOUNTER — Other Ambulatory Visit: Payer: Self-pay | Admitting: Family

## 2018-09-23 ENCOUNTER — Ambulatory Visit: Payer: BLUE CROSS/BLUE SHIELD | Admitting: Family

## 2018-10-03 ENCOUNTER — Ambulatory Visit: Payer: BLUE CROSS/BLUE SHIELD | Admitting: Family

## 2018-10-03 ENCOUNTER — Encounter: Payer: Self-pay | Admitting: Family

## 2018-10-03 ENCOUNTER — Ambulatory Visit (INDEPENDENT_AMBULATORY_CARE_PROVIDER_SITE_OTHER): Payer: BC Managed Care – PPO | Admitting: Family

## 2018-10-03 DIAGNOSIS — R42 Dizziness and giddiness: Secondary | ICD-10-CM

## 2018-10-03 DIAGNOSIS — R5383 Other fatigue: Secondary | ICD-10-CM

## 2018-10-03 DIAGNOSIS — E1165 Type 2 diabetes mellitus with hyperglycemia: Secondary | ICD-10-CM | POA: Diagnosis not present

## 2018-10-03 DIAGNOSIS — I10 Essential (primary) hypertension: Secondary | ICD-10-CM

## 2018-10-03 DIAGNOSIS — E663 Overweight: Secondary | ICD-10-CM

## 2018-10-03 DIAGNOSIS — E1169 Type 2 diabetes mellitus with other specified complication: Secondary | ICD-10-CM | POA: Diagnosis not present

## 2018-10-03 DIAGNOSIS — I152 Hypertension secondary to endocrine disorders: Secondary | ICD-10-CM

## 2018-10-03 DIAGNOSIS — K219 Gastro-esophageal reflux disease without esophagitis: Secondary | ICD-10-CM

## 2018-10-03 DIAGNOSIS — E1159 Type 2 diabetes mellitus with other circulatory complications: Secondary | ICD-10-CM | POA: Diagnosis not present

## 2018-10-03 DIAGNOSIS — E785 Hyperlipidemia, unspecified: Secondary | ICD-10-CM

## 2018-10-03 MED ORDER — METFORMIN HCL ER 500 MG PO TB24: 1500.0000 mg | ORAL_TABLET | Freq: Every day | ORAL | 1 refills | Status: DC

## 2018-10-03 NOTE — Progress Notes (Signed)
Virtual Visit via telephone Note Due to COVID-19 pandemic this visit was conducted virtually. This visit type was conducted due to national recommendations for restrictions regarding the COVID-19 Pandemic (e.g. social distancing, sheltering in place) in an effort to limit this patient's exposure and mitigate transmission in our community. All issues noted in this document were discussed and addressed.  A physical exam was not performed with this format.  I connected with Jason Rogers on 10/03/18 at 8:22 AM by telephone and verified that I am speaking with the correct person using two identifiers. Jason Rogers is currently located at home and wife  is currently with her during visit. The provider, Evelina Dun, FNP is located in their office at time of visit.  I discussed the limitations, risks, security and privacy concerns of performing an evaluation and management service by telephone and the availability of in person appointments. I also discussed with the patient that there may be a patient responsible charge related to this service. The patient expressed understanding and agreed to proceed.   History and Present Illness:  PT presents to the office today for chronic follow up. Pt had surgery on right bicep in 02/2018 and continues to have pain and has not returned to work. He is followed by the surgeon every month and PT three times a week and a massage therapy once a week.    PT reports intermittent dizziness. He states he went to donate blood and was not allowed because his iron levels were too low. He states he started OTC iron over the last week. 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 He had contributed this to being out of shape since he hasn't worked in the last 8 months. Diabetes He presents for his follow-up diabetic visit. He has type 2 diabetes mellitus. His disease course has been stable.  There are no hypoglycemic associated symptoms. Pertinent negatives for hypoglycemia include no headaches. Pertinent negatives for diabetes include no blurred vision, no foot paresthesias and no visual change. There are no hypoglycemic complications. Symptoms are stable. Pertinent negatives for diabetic complications include no CVA, heart disease, nephropathy or peripheral neuropathy. Risk factors for coronary artery disease include dyslipidemia, diabetes mellitus, obesity, male sex, hypertension and sedentary lifestyle. He is following a generally unhealthy diet. His overall blood glucose range is 140-180 mg/dl. Eye exam is current.  Hypertension This is a chronic problem. The current episode started more than 1 year ago (130/80). The problem has been resolved since onset. The problem is controlled. Pertinent negatives include no blurred vision, headaches, malaise/fatigue, peripheral edema or shortness of breath. (Dizziness) Risk factors for coronary artery disease include dyslipidemia, male gender, obesity and sedentary lifestyle. The current treatment provides moderate improvement. There is no history of kidney disease, CAD/MI or CVA.      Review of Systems  Constitutional: Negative for malaise/fatigue.  Eyes: Negative for blurred vision.  Respiratory: Negative for shortness of breath.   Neurological: Negative for headaches.     Observations/Objective: No SOB or distress   Assessment and Plan: [name] comes in today with chief complaint of No chief complaint on file.   Diagnosis and orders addressed:  1. Hypertension associated with diabetes ([place]) - CMP14+EGFR; Future  2. Gastroesophageal reflux disease, esophagitis presence not specified - CMP14+EGFR; Future  3. Hyperlipidemia associated with type 2 diabetes mellitus ([place]) - CMP14+EGFR; Future - Lipid panel; Future  4. Type 2 diabetes mellitus with hyperglycemia, without long-term current use of insulin (HCC) - metFORMIN  (GLUCOPHAGE XR) 500 MG 24 hr tablet; Take 3 tablets (1,500 mg total) by mouth daily with breakfast.  Dispense: 270 tablet; Refill: 1 - Bayer DCA Hb A1c Waived; Future - CMP14+EGFR; Future  5. Overweight (BMI 25.0-29.9) - CMP14+EGFR; Future  6. Fatigue, unspecified type - CMP14+EGFR; Future - Thyroid Panel With TSH; Future  7. Dizziness - Anemia Profile B; Future - CMP14+EGFR; Future - Thyroid Panel With TSH; Future   Labs pending Health Maintenance reviewed Diet and exercise encouraged  Follow up plan: 3 months      I discussed the assessment and treatment plan with the patient. The patient was provided an opportunity to ask questions and all were answered. The patient agreed with the plan and demonstrated an understanding of the instructions.   The patient was advised to call back or seek an in-person evaluation if the symptoms worsen or if the condition fails to improve as anticipated.  The above assessment and management plan was discussed with the patient. The patient verbalized understanding of and has agreed to the management plan. Patient is aware to call the clinic if symptoms persist or worsen. Patient is aware when to return to the clinic for a follow-up visit. Patient educated on when it is appropriate to go to the emergency department.   Time call ended: 8:45 AM   I provided 23 minutes of non-face-to-face time during this encounter.    Evelina Dun, FNP

## 2018-10-06 ENCOUNTER — Other Ambulatory Visit: Payer: Self-pay | Admitting: Family

## 2018-10-10 ENCOUNTER — Other Ambulatory Visit: Payer: Self-pay

## 2018-10-10 ENCOUNTER — Ambulatory Visit (INDEPENDENT_AMBULATORY_CARE_PROVIDER_SITE_OTHER): Payer: BC Managed Care – PPO | Admitting: *Deleted

## 2018-10-10 ENCOUNTER — Other Ambulatory Visit: Payer: BC Managed Care – PPO

## 2018-10-10 DIAGNOSIS — E1165 Type 2 diabetes mellitus with hyperglycemia: Secondary | ICD-10-CM | POA: Diagnosis not present

## 2018-10-10 DIAGNOSIS — Z23 Encounter for immunization: Secondary | ICD-10-CM

## 2018-10-10 DIAGNOSIS — E785 Hyperlipidemia, unspecified: Secondary | ICD-10-CM | POA: Diagnosis not present

## 2018-10-10 DIAGNOSIS — E1159 Type 2 diabetes mellitus with other circulatory complications: Secondary | ICD-10-CM | POA: Diagnosis not present

## 2018-10-10 DIAGNOSIS — E1169 Type 2 diabetes mellitus with other specified complication: Secondary | ICD-10-CM | POA: Diagnosis not present

## 2018-10-10 DIAGNOSIS — I10 Essential (primary) hypertension: Secondary | ICD-10-CM | POA: Diagnosis not present

## 2018-10-10 LAB — BAYER DCA HB A1C WAIVED: HB A1C (BAYER DCA - WAIVED): 7.8 % — ABNORMAL HIGH (ref <7.0)

## 2018-10-10 NOTE — Progress Notes (Signed)
Pt given Shingrix vaccine Tolerated well 

## 2018-10-10 NOTE — Addendum Note (Signed)
Addended by: Liliane Bade on: 10/10/2018 08:09 AM   Modules accepted: Orders

## 2018-10-11 LAB — ANEMIA PROFILE B
Basophils Absolute: 0.1 10*3/uL (ref 0.0–0.2)
Basos: 1 %
EOS (ABSOLUTE): 0.1 10*3/uL (ref 0.0–0.4)
Eos: 2 %
Ferritin: 10 ng/mL — ABNORMAL LOW (ref 30–400)
Folate: 18.7 ng/mL (ref >3.0)
Hematocrit: 39.1 % (ref 37.5–51.0)
Hemoglobin: 11.5 g/dL — ABNORMAL LOW (ref 13.0–17.7)
Immature Grans (Abs): 0 10*3/uL (ref 0.0–0.1)
Immature Granulocytes: 0 %
Iron Saturation: 5 % — CL (ref 15–55)
Iron: 20 ug/dL — ABNORMAL LOW (ref 38–169)
Lymphocytes Absolute: 1.6 10*3/uL (ref 0.7–3.1)
Lymphs: 24 %
MCH: 20.5 pg — ABNORMAL LOW (ref 26.6–33.0)
MCHC: 29.4 g/dL — ABNORMAL LOW (ref 31.5–35.7)
MCV: 70 fL — ABNORMAL LOW (ref 79–97)
Monocytes Absolute: 0.7 10*3/uL (ref 0.1–0.9)
Monocytes: 10 %
Neutrophils Absolute: 4.2 10*3/uL (ref 1.4–7.0)
Neutrophils: 63 %
Platelets: 265 10*3/uL (ref 150–450)
RBC: 5.61 x10E6/uL (ref 4.14–5.80)
RDW: 18.5 % — ABNORMAL HIGH (ref 11.6–15.4)
Retic Ct Pct: 1.3 % (ref 0.6–2.6)
Total Iron Binding Capacity: 395 ug/dL (ref 250–450)
UIBC: 375 ug/dL — ABNORMAL HIGH (ref 111–343)
Vitamin B-12: 625 pg/mL (ref 232–1245)
WBC: 6.7 10*3/uL (ref 3.4–10.8)

## 2018-10-11 LAB — CMP14+EGFR
ALT: 19 IU/L (ref 0–44)
AST: 18 IU/L (ref 0–40)
Albumin/Globulin Ratio: 1.9 (ref 1.2–2.2)
Albumin: 4.3 g/dL (ref 3.8–4.8)
Alkaline Phosphatase: 63 IU/L (ref 39–117)
BUN/Creatinine Ratio: 16 (ref 10–24)
BUN: 18 mg/dL (ref 8–27)
Bilirubin Total: 0.6 mg/dL (ref 0.0–1.2)
CO2: 24 mmol/L (ref 20–29)
Calcium: 9 mg/dL (ref 8.6–10.2)
Chloride: 102 mmol/L (ref 96–106)
Creatinine, Ser: 1.16 mg/dL (ref 0.76–1.27)
GFR calc Af Amer: 77 mL/min/{1.73_m2} (ref >59)
GFR calc non Af Amer: 66 mL/min/{1.73_m2} (ref >59)
Globulin, Total: 2.3 g/dL (ref 1.5–4.5)
Glucose: 144 mg/dL — ABNORMAL HIGH (ref 65–99)
Potassium: 4.3 mmol/L (ref 3.5–5.2)
Sodium: 141 mmol/L (ref 134–144)
Total Protein: 6.6 g/dL (ref 6.0–8.5)

## 2018-10-11 LAB — LIPID PANEL
Chol/HDL Ratio: 3.9 ratio (ref 0.0–5.0)
Cholesterol, Total: 143 mg/dL (ref 100–199)
HDL: 37 mg/dL — ABNORMAL LOW (ref >39)
LDL Calculated: 61 mg/dL (ref 0–99)
Triglycerides: 224 mg/dL — ABNORMAL HIGH (ref 0–149)
VLDL Cholesterol Cal: 45 mg/dL — ABNORMAL HIGH (ref 5–40)

## 2018-10-11 LAB — THYROID PANEL WITH TSH
Free Thyroxine Index: 1.8 (ref 1.2–4.9)
T3 Uptake Ratio: 30 % (ref 24–39)
T4, Total: 6.1 ug/dL (ref 4.5–12.0)
TSH: 4.11 u[IU]/mL (ref 0.450–4.500)

## 2018-10-12 ENCOUNTER — Other Ambulatory Visit: Payer: Self-pay | Admitting: Family

## 2018-10-13 ENCOUNTER — Other Ambulatory Visit: Payer: Self-pay | Admitting: Family

## 2018-10-13 DIAGNOSIS — D509 Iron deficiency anemia, unspecified: Secondary | ICD-10-CM

## 2018-10-13 MED ORDER — ROSUVASTATIN CALCIUM 20 MG PO TABS: 20.0000 mg | ORAL_TABLET | Freq: Every day | ORAL | 2 refills | Status: DC

## 2018-11-05 ENCOUNTER — Ambulatory Visit (HOSPITAL_COMMUNITY): Payer: BLUE CROSS/BLUE SHIELD | Admitting: Hematology

## 2018-11-06 ENCOUNTER — Other Ambulatory Visit: Payer: Self-pay | Admitting: Family

## 2018-11-26 ENCOUNTER — Other Ambulatory Visit: Payer: Self-pay | Admitting: Family

## 2018-12-04 ENCOUNTER — Other Ambulatory Visit: Payer: Self-pay

## 2018-12-05 ENCOUNTER — Ambulatory Visit (INDEPENDENT_AMBULATORY_CARE_PROVIDER_SITE_OTHER): Payer: BC Managed Care – PPO

## 2018-12-05 DIAGNOSIS — Z23 Encounter for immunization: Secondary | ICD-10-CM

## 2018-12-24 ENCOUNTER — Encounter: Payer: Self-pay | Admitting: Family Medicine

## 2018-12-24 ENCOUNTER — Ambulatory Visit: Payer: BC Managed Care – PPO | Admitting: Family Medicine

## 2018-12-24 ENCOUNTER — Other Ambulatory Visit: Payer: Self-pay

## 2018-12-24 VITALS — BP 124/68 | HR 91 | Temp 98.4°F | Ht 72.0 in | Wt 230.0 lb

## 2018-12-24 DIAGNOSIS — L600 Ingrowing nail: Secondary | ICD-10-CM

## 2018-12-24 NOTE — Progress Notes (Signed)
Subjective: CC: ingrown toenail PCP: Sharion Balloon, FNP PH:[phone] Jason Rogers is a 63 y.o. male presenting to clinic today for:  1. Ingrown toenail Patient reports several day history of right-sided ingrown toenail.  He notes that he has been applying Polysporin, performing soaks and trying to extract the ingrown toenail himself.  Denies any purulence but does note pain along the lateral aspect of the toe.  He has had similar in the left foot before and is requesting removal today.  Of note patient with type 2 diabetes that is uncontrolled.  No intolerance to anesthetics, Betadine.   ROS: Per HPI  Allergies  Allergen Reactions  . Lipitor [Atorvastatin]     Depressed   Past Medical History:  Diagnosis Date  . Allergy   . Diabetes mellitus without complication ([place])   . Hyperlipidemia   . Hypertension     Current Outpatient Medications:  .  aspirin 81 MG tablet, Take 81 mg by mouth daily., Disp: , Rfl:  .  insulin degludec (TRESIBA FLEXTOUCH) 100 UNIT/ML SOPN FlexTouch Pen, Inject 0.13 mLs (13 Units total) into the skin at bedtime., Disp: 12 pen, Rfl: 2 .  JARDIANCE 25 MG TABS tablet, Take 1 tablet by mouth once daily, Disp: 90 tablet, Rfl: 0 .  lisinopril (ZESTRIL) 20 MG tablet, Take 1 tablet by mouth once daily, Disp: 90 tablet, Rfl: 1 .  loratadine (CLARITIN) 10 MG tablet, Take 10 mg by mouth daily., Disp: , Rfl:  .  metFORMIN (GLUCOPHAGE XR) 500 MG 24 hr tablet, Take 3 tablets (1,500 mg total) by mouth daily with breakfast., Disp: 270 tablet, Rfl: 1 .  omeprazole (PRILOSEC) 20 MG capsule, Take 1 capsule by mouth once daily, Disp: 90 capsule, Rfl: 0 .  rosuvastatin (CRESTOR) 20 MG tablet, Take 1 tablet (20 mg total) by mouth at bedtime., Disp: 90 tablet, Rfl: 2 .  TRULICITY 1.5 0000000 SOPN, INJECT 1 SYRINGE SUBCUTANEOUSLY ONCE A WEEK, Disp: 12 mL, Rfl: 0 Social History   Socioeconomic History  . Marital status: Married    Spouse name: Not on file  . Number of children:  Not on file  . Years of education: Not on file  . Highest education level: Not on file  Occupational History  . Not on file  Social Needs  . Financial resource strain: Not on file  . Food insecurity    Worry: Not on file    Inability: Not on file  . Transportation needs    Medical: Not on file    Non-medical: Not on file  Tobacco Use  . Smoking status: Never Smoker  . Smokeless tobacco: Never Used  Substance and Sexual Activity  . Alcohol use: Yes    Comment: rare  . Drug use: No  . Sexual activity: Not on file  Lifestyle  . Physical activity    Days per week: Not on file    Minutes per session: Not on file  . Stress: Not on file  Relationships  . Social Herbalist on phone: Not on file    Gets together: Not on file    Attends religious service: Not on file    Active member of club or organization: Not on file    Attends meetings of clubs or organizations: Not on file    Relationship status: Not on file  . Intimate partner violence    Fear of current or ex partner: Not on file    Emotionally abused: Not on file  Physically abused: Not on file    Forced sexual activity: Not on file  Other Topics Concern  . Not on file  Social History Narrative  . Not on file   Family History  Problem Relation Age of Onset  . Diabetes Mother   . Heart disease Father     Objective: Office vital signs reviewed. BP 124/68   Pulse 91   Temp 98.4 F (36.9 C) (Temporal)   Ht 6' (1.829 m)   Wt 230 lb (104.3 kg)   BMI 31.19 kg/m   Physical Examination:  General: Awake, alert, well appearing, No acute distress Skin: Right great toenail with inflammation, erythema and mild bleeding along the lateral aspect of the nail.  He has tenderness to palpation along this area as well.  No purulence noted.  No secondary infection appreciated. Neuro: light touch sensation grossly in tact  Toenail removal:  Informed consent with written consent scanned into the chart.  Appropriate timeout was taken.   Great toe was cleaned with 3 alcohol swabs prior to injecting anesthesia. Digital block of righ great toe was achieved using 5 cc of 2% lidocaine without epinephrine. Anesthesia was achieved after about 4.5 minutes. Area was prepped in usual sterile fashion with Betadine swab x2. A nail elevator was used to free the lateral aspect of the nail from the nail bed. Blunt scissors were used to dissect the lateral aspect of the nail through its entire length down to the base of the nail.Marland Kitchen Hemostats were then used to remove the lateral aspect of the nail via traction and rotation. Less than 2 cc of blood loss.  Pressure applied and hemostasis was achieved. Patient tolerated procedure well. There were no immediate complications.  Area was cleaned again with alcohol swabs to remove the Betadine. Topical antibiotic ointment applied and a nonstick dressing was applied. Toe was wrapped in gauze bandage.  Home care instructions and aftercare reviewed with patient. Handout was provided.  Assessment/ Plan: 63 y.o. male   1. Ingrown toenail of right foot Risks and benefits of the procedure discussed with the patient.  We discussed the risk for slow or poor healing given uncontrolled diabetes.  He wished to proceed with the procedure.  The affected area was successfully removed.  There were no immediate complications.  Advised to continue pressure bandage until tomorrow then may resume normal wound care.  Red flag signs and symptoms warranting further evaluation discussed.  Patient was good understanding will follow-up as needed.   No orders of the defined types were placed in this encounter.  No orders of the defined types were placed in this encounter.    Janora Norlander, DO [place] [phone]

## 2018-12-24 NOTE — Patient Instructions (Signed)
TOENAIL REMOVAL HOME CARE INSTRUCTIONS   Keep your foot elevated to relieve pain and swelling. This means lying on a bed or on a couch with the leg on pillows or sitting in a recliner with the leg up. Walking or letting your leg dangle may increase swelling, slow healing and causes a throbbing pain.  Keep your bandage dry and clean.  Change your bandage in 24 hours.  After your bandage is changed, soak your foot in warm, soapy water for 10-20 minutes. Do this 3 times a day for the next 3-4 days. This will help reduce pain and swelling.  After soaking your foot, apply Vaseline or antibiotic ointment. Apply a clean, dry bandage. Change your bandage if it becomes wet or dirty.  After a few days, a thick, gray film may form where the toenail was. This is normal.  Only take over-the-counter or prescription medications for pain or discomfort as directed by your doctor.  See your doctor as needed for any problems.    You might need a tetanus shot and now if:  You have no idea when you have the last one.  You have never had a tetanus shot before.   You have had only one tetanus shot in your lifetime.   Seek immediate medical care if :  You have increased pain, swelling, redness, warmth, drainage, pus, or bleeding in your toe.   You have a fever.  You have swelling and redness that spreads from your toe anterior foot.

## 2018-12-30 ENCOUNTER — Other Ambulatory Visit: Payer: Self-pay | Admitting: Family

## 2019-02-01 ENCOUNTER — Other Ambulatory Visit: Payer: Self-pay | Admitting: Family

## 2019-02-13 ENCOUNTER — Other Ambulatory Visit: Payer: Self-pay | Admitting: Family

## 2019-02-13 DIAGNOSIS — E1165 Type 2 diabetes mellitus with hyperglycemia: Secondary | ICD-10-CM

## 2019-02-14 ENCOUNTER — Other Ambulatory Visit: Payer: Self-pay | Admitting: Family

## 2019-03-11 ENCOUNTER — Telehealth: Payer: Self-pay

## 2019-03-11 DIAGNOSIS — E1165 Type 2 diabetes mellitus with hyperglycemia: Secondary | ICD-10-CM

## 2019-03-11 NOTE — Telephone Encounter (Signed)
Patient has changed insurances and they suggested that he see a specialist for his diabetes and maybe he can come off of some meds. Pts wife wants to know if you will place a referral? They want to see endocrinologist Lucilla Lame and they prefer to go on a Friday. Please advise

## 2019-03-12 ENCOUNTER — Telehealth: Payer: Self-pay | Admitting: Family

## 2019-03-12 NOTE — Telephone Encounter (Signed)
Referral placed.

## 2019-03-20 ENCOUNTER — Telehealth: Payer: Self-pay | Admitting: Family

## 2019-03-20 DIAGNOSIS — E1165 Type 2 diabetes mellitus with hyperglycemia: Secondary | ICD-10-CM

## 2019-03-20 NOTE — Telephone Encounter (Signed)
REFERRAL REQUEST Telephone Note  What type of referral do you need? Endocrinology   Have you been seen at our office for this problem? Yes - Previous Ref was sent to Dr. Dorris Fetch - Patient called back and stated he would like to go to [facility] instead (Advise that they will likely need an appointment with their PCP before a referral can be done)  Is there a particular doctor or location that you prefer? [place]  Patient notified that referrals can take up to a week or longer to process. If they haven't heard anything within a week they should call back and speak with the referral department.

## 2019-03-28 ENCOUNTER — Other Ambulatory Visit: Payer: Self-pay | Admitting: Family

## 2019-03-28 DIAGNOSIS — E1165 Type 2 diabetes mellitus with hyperglycemia: Secondary | ICD-10-CM

## 2019-03-30 ENCOUNTER — Other Ambulatory Visit: Payer: Self-pay | Admitting: Family

## 2019-04-03 ENCOUNTER — Ambulatory Visit (INDEPENDENT_AMBULATORY_CARE_PROVIDER_SITE_OTHER): Payer: Medicare HMO | Admitting: Family

## 2019-04-03 ENCOUNTER — Other Ambulatory Visit: Payer: Self-pay

## 2019-04-03 ENCOUNTER — Encounter: Payer: Self-pay | Admitting: Family

## 2019-04-03 VITALS — BP 135/86 | HR 79 | Temp 97.4°F | Ht 72.0 in | Wt 230.0 lb

## 2019-04-03 DIAGNOSIS — E785 Hyperlipidemia, unspecified: Secondary | ICD-10-CM

## 2019-04-03 DIAGNOSIS — E663 Overweight: Secondary | ICD-10-CM

## 2019-04-03 DIAGNOSIS — Z23 Encounter for immunization: Secondary | ICD-10-CM | POA: Diagnosis not present

## 2019-04-03 DIAGNOSIS — D509 Iron deficiency anemia, unspecified: Secondary | ICD-10-CM | POA: Diagnosis not present

## 2019-04-03 DIAGNOSIS — E1169 Type 2 diabetes mellitus with other specified complication: Secondary | ICD-10-CM

## 2019-04-03 DIAGNOSIS — E1165 Type 2 diabetes mellitus with hyperglycemia: Secondary | ICD-10-CM

## 2019-04-03 DIAGNOSIS — R6889 Other general symptoms and signs: Secondary | ICD-10-CM | POA: Diagnosis not present

## 2019-04-03 DIAGNOSIS — I10 Essential (primary) hypertension: Secondary | ICD-10-CM

## 2019-04-03 DIAGNOSIS — E1159 Type 2 diabetes mellitus with other circulatory complications: Secondary | ICD-10-CM

## 2019-04-03 DIAGNOSIS — I152 Hypertension secondary to endocrine disorders: Secondary | ICD-10-CM

## 2019-04-03 DIAGNOSIS — K219 Gastro-esophageal reflux disease without esophagitis: Secondary | ICD-10-CM

## 2019-04-03 LAB — BAYER DCA HB A1C WAIVED: HB A1C (BAYER DCA - WAIVED): 7.7 % — ABNORMAL HIGH (ref <7.0)

## 2019-04-03 MED ORDER — JARDIANCE 25 MG PO TABS: 25.0000 mg | ORAL_TABLET | Freq: Every day | ORAL | 3 refills | Status: DC

## 2019-04-03 MED ORDER — METFORMIN HCL ER 500 MG PO TB24: ORAL_TABLET | ORAL | 1 refills | Status: DC

## 2019-04-03 MED ORDER — LISINOPRIL 20 MG PO TABS: 20.0000 mg | ORAL_TABLET | Freq: Every day | ORAL | 3 refills | Status: DC

## 2019-04-03 MED ORDER — OMEPRAZOLE 20 MG PO CPDR: 20.0000 mg | DELAYED_RELEASE_CAPSULE | Freq: Every day | ORAL | 2 refills | Status: DC

## 2019-04-03 MED ORDER — TRULICITY 1.5 MG/0.5ML ~~LOC~~ SOAJ: SUBCUTANEOUS | 0 refills | Status: DC

## 2019-04-03 MED ORDER — TRESIBA FLEXTOUCH 100 UNIT/ML ~~LOC~~ SOPN: PEN_INJECTOR | SUBCUTANEOUS | 0 refills | Status: DC

## 2019-04-03 MED ORDER — LORATADINE 10 MG PO TABS: 10.0000 mg | ORAL_TABLET | Freq: Every day | ORAL | 3 refills | Status: DC

## 2019-04-03 MED ORDER — ROSUVASTATIN CALCIUM 20 MG PO TABS: 20.0000 mg | ORAL_TABLET | Freq: Every day | ORAL | 2 refills | Status: DC

## 2019-04-03 NOTE — Progress Notes (Signed)
Subjective:    Patient ID: Jason Rogers, male    DOB: 08-05-1955, 64 y.o.   MRN: [id number]  Chief Complaint  Patient presents with  . Medical Management of Chronic Issues   PT presents to the office today for chronic follow up. Pt had surgery on right bicep in 02/2018 and has restarted work.   Pt has Endocrinologists appointment in March.  Hypertension This is a chronic problem. The current episode started more than 1 year ago. The problem has been waxing and waning since onset. The problem is uncontrolled. Associated symptoms include malaise/fatigue. Pertinent negatives include no blurred vision, headaches, peripheral edema or shortness of breath. Risk factors for coronary artery disease include obesity, male gender, sedentary lifestyle and dyslipidemia. Past treatments include ACE inhibitors. The current treatment provides moderate improvement. There is no history of kidney disease, CAD/MI, CVA or heart failure.  Diabetes He presents for his follow-up diabetic visit. He has type 2 diabetes mellitus. His disease course has been worsening. There are no hypoglycemic associated symptoms. Pertinent negatives for hypoglycemia include no headaches. Associated symptoms include foot paresthesias and visual change (once in awhile). Pertinent negatives for diabetes include no blurred vision. Symptoms are stable. Diabetic complications include peripheral neuropathy. Pertinent negatives for diabetic complications include no CVA, heart disease or nephropathy. Risk factors for coronary artery disease include diabetes mellitus, dyslipidemia, male sex, hypertension, stress and sedentary lifestyle. He is following a generally unhealthy diet. His overall blood glucose range is 180-200 mg/dl.  Hyperlipidemia This is a chronic problem. The current episode started more than 1 year ago. The problem is uncontrolled. Recent lipid tests were reviewed and are normal. Pertinent negatives include no shortness of breath.  The current treatment provides moderate improvement of lipids. Risk factors for coronary artery disease include male sex, dyslipidemia, diabetes mellitus and hypertension.  Gastroesophageal Reflux He complains of belching and heartburn. This is a chronic problem. The current episode started more than 1 year ago. The problem occurs occasionally. The problem has been waxing and waning. The treatment provided moderate relief.  Anemia Presents for follow-up visit. Symptoms include light-headedness and malaise/fatigue. There has been no bruising/bleeding easily. There is no history of heart failure.      Review of Systems  Constitutional: Positive for malaise/fatigue.  Eyes: Negative for blurred vision.  Respiratory: Negative for shortness of breath.   Gastrointestinal: Positive for heartburn.  Neurological: Positive for light-headedness. Negative for headaches.  Hematological: Does not bruise/bleed easily.  All other systems reviewed and are negative.      Objective:   Physical Exam Vitals reviewed.  Constitutional:      General: He is not in acute distress.    Appearance: He is well-developed.  HENT:     Head: Normocephalic.     Right Ear: Tympanic membrane normal.     Left Ear: Tympanic membrane normal.  Eyes:     General:        Right eye: No discharge.        Left eye: No discharge.     Pupils: Pupils are equal, round, and reactive to light.  Neck:     Thyroid: No thyromegaly.  Cardiovascular:     Rate and Rhythm: Normal rate and regular rhythm.     Heart sounds: Normal heart sounds. No murmur.  Pulmonary:     Effort: Pulmonary effort is normal. No respiratory distress.     Breath sounds: Normal breath sounds. No wheezing.  Abdominal:     General: Bowel sounds  are normal. There is no distension.     Palpations: Abdomen is soft.     Tenderness: There is no abdominal tenderness.  Musculoskeletal:        General: No tenderness. Normal range of motion.     Cervical back:  Normal range of motion and neck supple.  Skin:    General: Skin is warm and dry.     Findings: No erythema or rash.  Neurological:     Mental Status: He is alert and oriented to person, place, and time.     Cranial Nerves: No cranial nerve deficit.     Deep Tendon Reflexes: Reflexes are normal and symmetric.  Psychiatric:        Behavior: Behavior normal.        Thought Content: Thought content normal.        Judgment: Judgment normal.    Diabetic Foot Exam - Simple   Simple Foot Form Diabetic Foot exam was performed with the following findings: Yes 04/03/2019  9:09 AM  Visual Inspection No deformities, no ulcerations, no other skin breakdown bilaterally: Yes Sensation Testing Intact to touch and monofilament testing bilaterally: Yes Pulse Check Posterior Tibialis and Dorsalis pulse intact bilaterally: Yes Comments      BP 135/86   Pulse 79   Temp (!) 97.4 F (36.3 C) (Temporal)   Ht 6' (1.829 m)   Wt 230 lb (104.3 kg)   SpO2 100%   BMI 31.19 kg/m      Assessment & Plan:  KWALI WRINKLE comes in today with chief complaint of Medical Management of Chronic Issues   Diagnosis and orders addressed:  1. Type 2 diabetes mellitus with hyperglycemia, without long-term current use of insulin (HCC) - insulin degludec (TRESIBA FLEXTOUCH) 100 UNIT/ML SOPN FlexTouch Pen; INJECT 20 UNITS SUBCUTANEOUSLY AT BEDTIME  Dispense: 15 mL; Refill: 0 - metFORMIN (GLUCOPHAGE-XR) 500 MG 24 hr tablet; TAKE 3 TABLETS BY MOUTH ONCE DAILY WITH BREAKFAST  Dispense: 270 tablet; Refill: 1 - empagliflozin (JARDIANCE) 25 MG TABS tablet; Take 25 mg by mouth daily.  Dispense: 90 tablet; Refill: 3 - Dulaglutide (TRULICITY) 1.5 WV/3.7TG SOPN; INJECT 1 SYRINGE SUBCUTANEOUSLY ONCE A WEEK  Dispense: 12 mL; Refill: 0 - CMP14+EGFR - Bayer DCA Hb A1c Waived - Microalbumin / creatinine urine ratio  2. Hypertension associated with diabetes ([place]) - lisinopril (ZESTRIL) 20 MG tablet; Take 1 tablet (20 mg total)  by mouth daily.  Dispense: 90 tablet; Refill: 3 - CMP14+EGFR  3. Gastroesophageal reflux disease, unspecified whether esophagitis present - omeprazole (PRILOSEC) 20 MG capsule; Take 1 capsule (20 mg total) by mouth daily.  Dispense: 90 capsule; Refill: 2 - CMP14+EGFR  4. Hyperlipidemia associated with type 2 diabetes mellitus (HCC) - rosuvastatin (CRESTOR) 20 MG tablet; Take 1 tablet (20 mg total) by mouth at bedtime.  Dispense: 90 tablet; Refill: 2 - CMP14+EGFR  5. Overweight (BMI 25.0-29.9) - CMP14+EGFR  6. Iron deficiency anemia, unspecified iron deficiency anemia type - Anemia Profile B   Labs pending Health Maintenance reviewed Diet and exercise encouraged  Follow up plan: 6 months   Evelina Dun, FNP

## 2019-04-03 NOTE — Patient Instructions (Signed)

## 2019-04-04 LAB — ANEMIA PROFILE B
Basophils Absolute: 0 10*3/uL (ref 0.0–0.2)
Basos: 1 %
EOS (ABSOLUTE): 0.1 10*3/uL (ref 0.0–0.4)
Eos: 2 %
Ferritin: 9 ng/mL — ABNORMAL LOW (ref 30–400)
Folate: >20 ng/mL (ref >3.0)
Hematocrit: 40.3 % (ref 37.5–51.0)
Hemoglobin: 12.4 g/dL — ABNORMAL LOW (ref 13.0–17.7)
Immature Grans (Abs): 0 10*3/uL (ref 0.0–0.1)
Immature Granulocytes: 0 %
Iron Saturation: 5 % — CL (ref 15–55)
Iron: 23 ug/dL — ABNORMAL LOW (ref 38–169)
Lymphocytes Absolute: 1.8 10*3/uL (ref 0.7–3.1)
Lymphs: 25 %
MCH: 23.1 pg — ABNORMAL LOW (ref 26.6–33.0)
MCHC: 30.8 g/dL — ABNORMAL LOW (ref 31.5–35.7)
MCV: 75 fL — ABNORMAL LOW (ref 79–97)
Monocytes Absolute: 0.6 10*3/uL (ref 0.1–0.9)
Monocytes: 9 %
Neutrophils Absolute: 4.5 10*3/uL (ref 1.4–7.0)
Neutrophils: 63 %
Platelets: 231 10*3/uL (ref 150–450)
RBC: 5.37 x10E6/uL (ref 4.14–5.80)
RDW: 15.9 % — ABNORMAL HIGH (ref 11.6–15.4)
Retic Ct Pct: 1.1 % (ref 0.6–2.6)
Total Iron Binding Capacity: 426 ug/dL (ref 250–450)
UIBC: 403 ug/dL — ABNORMAL HIGH (ref 111–343)
Vitamin B-12: 664 pg/mL (ref 232–1245)
WBC: 7.1 10*3/uL (ref 3.4–10.8)

## 2019-04-04 LAB — CMP14+EGFR
ALT: 18 IU/L (ref 0–44)
AST: 21 IU/L (ref 0–40)
Albumin/Globulin Ratio: 1.7 (ref 1.2–2.2)
Albumin: 4.3 g/dL (ref 3.8–4.8)
Alkaline Phosphatase: 50 IU/L (ref 39–117)
BUN/Creatinine Ratio: 21 (ref 10–24)
BUN: 20 mg/dL (ref 8–27)
Bilirubin Total: 1.1 mg/dL (ref 0.0–1.2)
CO2: 22 mmol/L (ref 20–29)
Calcium: 9.2 mg/dL (ref 8.6–10.2)
Chloride: 103 mmol/L (ref 96–106)
Creatinine, Ser: 0.96 mg/dL (ref 0.76–1.27)
GFR calc Af Amer: 95 mL/min/{1.73_m2} (ref >59)
GFR calc non Af Amer: 83 mL/min/{1.73_m2} (ref >59)
Globulin, Total: 2.5 g/dL (ref 1.5–4.5)
Glucose: 113 mg/dL — ABNORMAL HIGH (ref 65–99)
Potassium: 4.6 mmol/L (ref 3.5–5.2)
Sodium: 140 mmol/L (ref 134–144)
Total Protein: 6.8 g/dL (ref 6.0–8.5)

## 2019-04-04 LAB — MICROALBUMIN / CREATININE URINE RATIO
Creatinine, Urine: 33.7 mg/dL
Microalb/Creat Ratio: <9 mg/g creat (ref 0–29)
Microalbumin, Urine: <3 ug/mL

## 2019-04-08 ENCOUNTER — Other Ambulatory Visit: Payer: Self-pay | Admitting: Family

## 2019-04-08 DIAGNOSIS — D509 Iron deficiency anemia, unspecified: Secondary | ICD-10-CM

## 2019-04-09 ENCOUNTER — Telehealth: Payer: Self-pay | Admitting: Hematology and Oncology

## 2019-04-09 NOTE — Telephone Encounter (Signed)
Received a new hem referral from Evelina Dun, [place] from [place] for Jason Rogers. Mr. Stocks has been cld and scheduled to seee Dr. Lorenso Courier in 3/5 at 9am. Appt date and time has been given to his wife who's aware to arrive 15 minutes early.

## 2019-05-01 ENCOUNTER — Encounter: Payer: Self-pay | Admitting: Hematology and Oncology

## 2019-05-01 ENCOUNTER — Ambulatory Visit: Payer: Self-pay | Attending: Internal Medicine

## 2019-05-01 ENCOUNTER — Other Ambulatory Visit: Payer: Self-pay

## 2019-05-01 DIAGNOSIS — E785 Hyperlipidemia, unspecified: Secondary | ICD-10-CM | POA: Diagnosis not present

## 2019-05-01 DIAGNOSIS — E1169 Type 2 diabetes mellitus with other specified complication: Secondary | ICD-10-CM | POA: Diagnosis not present

## 2019-05-01 DIAGNOSIS — Z794 Long term (current) use of insulin: Secondary | ICD-10-CM | POA: Diagnosis not present

## 2019-05-01 DIAGNOSIS — E1165 Type 2 diabetes mellitus with hyperglycemia: Secondary | ICD-10-CM | POA: Diagnosis not present

## 2019-05-01 DIAGNOSIS — Z23 Encounter for immunization: Secondary | ICD-10-CM | POA: Insufficient documentation

## 2019-05-01 NOTE — Progress Notes (Signed)
   Covid-19 Vaccination Clinic  Name:  Jason Rogers    MRN: [id number] DOB: December 05, 1955  05/01/2019  [name] was observed post Covid-19 immunization for 15 minutes without incident. He was provided with Vaccine Information Sheet and instruction to access the V-Safe system.   [name] was instructed to call 911 with any severe reactions post vaccine: Marland Kitchen Difficulty breathing  . Swelling of face and throat  . A fast heartbeat  . A bad rash all over body  . Dizziness and weakness   Immunizations Administered    Name Date Dose VIS Date Route   Moderna COVID-19 Vaccine 05/01/2019  8:38 AM 0.5 mL 01/27/2019 Intramuscular   Manufacturer: Moderna   Lot: [id number]   [id number]

## 2019-05-06 ENCOUNTER — Other Ambulatory Visit: Payer: Self-pay

## 2019-05-06 ENCOUNTER — Inpatient Hospital Stay: Payer: Self-pay | Admitting: Hematology and Oncology

## 2019-05-08 ENCOUNTER — Telehealth: Payer: Self-pay

## 2019-05-08 NOTE — Telephone Encounter (Signed)
-----   Message from Lemont Fillers sent at 04/23/2019  3:10 PM EST ----- I rescheduled this pt today for 3/19. It was the first available New Patient appt that coordinated with his work schedule. The pt's wife, who made the appt, is concerned. She said that they have waiting a month already. She has been giving him iron pill because he is very tired, but she's concerned that it may cause his results to come back inaccurate. Can you call her and advise as to what they need to do in preparation for his appt.  Thanks!

## 2019-05-08 NOTE — Telephone Encounter (Signed)
TCT patient multiple times with no response and unable to LVM.

## 2019-05-14 NOTE — Progress Notes (Signed)
Jason [phone]   [phone] [place] NOTE  Patient Care Team: Sharion Balloon, FNP as PCP - General (Nurse Practitioner)  Hematological/Oncological History # Iron Deficiency Anemia 1) 07/26/2017: WBC 8.1, Hgb 13.0, MCV 74, Plt 216.  2) 01/31/2018: WBC 7.3, Hgb 12.2, MCV 69, Plt 256 3) 10/10/2018: WBC 6.7, Hgb 11.5, MCV 70, Plt 265 Iron 20, TIBC 395, ferritin 10, iron sat 5% 4) 04/03/2019: WBC 7.1, Hgb 12.4, MCV 75, Plt 231. Iron 23, TIBC 426, ferritin 9, Iron sat 5% 5) 05/15/2019: establish care with Dr. Lorenso Courier   CHIEF COMPLAINTS/PURPOSE OF CONSULTATION:  "Iron Deficiency Anemia "  HISTORY OF PRESENTING ILLNESS:  Jason Rogers 64 y.o. male with medical history significant for HTN, HLD, and DM type II who presents for evaluation of iron deficiency anemia.  On review of the previous records Jason Rogers was initially noted to have an anemia on 01/31/2018. At that time he was found to have a white blood cell count of 7.3, hemoglobin of 12.2, MCV of 69, and a platelet count of 256. He was previously checked on 07/26/2017 at which time he continued to have a low MCV 74, but hemoglobin of 13.0. Prior to that in early 2019 his hemoglobin was as high as 15.6. On 10/10/2018 the patient was found to have a hemoglobin of 11.5, iron of 20, ferritin of 10, iron sat of 5%. He was started on p.o. iron therapy at that time. On recheck on 04/03/2019 the patient was found to have a hemoglobin of 12.4, iron of 23, ferritin of 9, and iron sat of 5%. Due to the patient's markedly low iron he was referred to hematology for further evaluation and management.  On exam today Jason Rogers is accompanied by his wife.  He reports that he has been having issues with irritability, tiredness, and fatigue.  He notes that he has been falling asleep earlier around 8 to 9:00 PM on arrival from home.  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  He reports  that he has had a little bit of shortness of breath, but nothing out of the ordinary for his level of fitness.  He denies having any dark stools, bleeding, bruising, or any other overt signs of bleeding at this time.  He reports that he began taking an iron pill approximately 6 months ago that was an extended release and packaged with a stool softener.  He notes that he was taking this initially daily, and then once every other day.  Interestingly Jason Rogers is a heavy blood donor.  He reports that he donates every 2 months and has done so for the last 10 years.  The last time he donated was on [birth year] which was his [birth year].  He notes that they have stuck his fingers multiple times and occasionally have found low hemoglobins, but then switch to the other hand and often times receive a normal reading.  He has been donating consistently throughout this time.  On further discussion Jason Rogers denies any nausea, vomiting, diarrhea, fevers, chills, sweats, or weight loss.  He notes that he has had cravings for ice but denies any leg cramps.  A full 10 point ROS is listed below.  MEDICAL HISTORY:  Past Medical History:  Diagnosis Date   Allergy    Diabetes mellitus without complication ([place])    Hyperlipidemia    Hypertension     SURGICAL HISTORY: Past Surgical History:  Procedure Laterality Date   HERNIA REPAIR     TESTICLE SURGERY     TONSILLECTOMY AND ADENOIDECTOMY     VASECTOMY      SOCIAL HISTORY: Social History   Socioeconomic History   Marital status: Married    Spouse name: Not on file   Number of children: Not on file   Years of education: Not on file   Highest education level: Not on file  Occupational History   Not on file  Tobacco Use   Smoking status: Never Smoker   Smokeless tobacco: Never Used  Substance and Sexual Activity   Alcohol use: Yes    Comment: rare   Drug use: No   Sexual activity: Not on file  Other Topics Concern   Not on file    Social History Narrative   Not on file   Social Determinants of Health   Financial Resource Strain:    Difficulty of Paying Living Expenses:   Food Insecurity:    Worried About Charity fundraiser in the Last Year:    Arboriculturist in the Last Year:   Transportation Needs:    Film/video editor (Medical):    Lack of Transportation (Non-Medical):   Physical Activity:    Days of Exercise per Week:    Minutes of Exercise per Session:   Stress:    Feeling of Stress :   Social Connections:    Frequency of Communication with Friends and Family:    Frequency of Social Gatherings with Friends and Family:    Attends Religious Services:    Active Member of Clubs or Organizations:    Attends Music therapist:    Marital Status:   Intimate Partner Violence:    Fear of Current or Ex-Partner:    Emotionally Abused:    Physically Abused:    Sexually Abused:     FAMILY HISTORY: Family History  Problem Relation Age of Onset   Diabetes Mother    Heart disease Father     ALLERGIES:  is allergic to lipitor [atorvastatin].  MEDICATIONS:  Current Outpatient Medications  Medication Sig Dispense Refill   cholecalciferol (VITAMIN D3) 25 MCG (1000 UNIT) tablet Take 1,000 Units by mouth daily.     aspirin 81 MG tablet Take 81 mg by mouth daily.     Dulaglutide (TRULICITY) 1.5 VV/6.1YW SOPN INJECT 1 SYRINGE SUBCUTANEOUSLY ONCE A WEEK 12 mL 0   empagliflozin (JARDIANCE) 25 MG TABS tablet Take 25 mg by mouth daily. 90 tablet 3   ferrous sulfate 325 (65 FE) MG tablet Take 1 tablet (325 mg total) by mouth at bedtime. 90 tablet 3   insulin degludec (TRESIBA FLEXTOUCH) 100 UNIT/ML SOPN FlexTouch Pen INJECT 20 UNITS SUBCUTANEOUSLY AT BEDTIME 15 mL 0   lisinopril (ZESTRIL) 20 MG tablet Take 1 tablet (20 mg total) by mouth daily. 90 tablet 3   loratadine (CLARITIN) 10 MG tablet Take 1 tablet (10 mg total) by mouth daily. 90 tablet 3   metFORMIN  (GLUCOPHAGE-XR) 500 MG 24 hr tablet TAKE 3 TABLETS BY MOUTH ONCE DAILY WITH BREAKFAST 270 tablet 1   omeprazole (PRILOSEC) 20 MG capsule Take 1 capsule (20 mg total) by mouth daily. 90 capsule 2   rosuvastatin (CRESTOR) 20 MG tablet Take 1 tablet (20 mg total) by mouth at bedtime. 90 tablet 2   No current facility-administered medications for this visit.    REVIEW OF SYSTEMS:   Constitutional: ( - ) fevers, ( - )  chills , ( - ) night sweats (+) fatigue Eyes: ( - ) blurriness of vision, ( - ) double vision, ( - ) watery eyes Ears, nose, mouth, throat, and face: ( - ) mucositis, ( - ) sore throat Respiratory: ( - ) cough, ( - ) dyspnea, ( - ) wheezes Cardiovascular: ( - ) palpitation, ( - ) chest discomfort, ( - ) lower extremity swelling Gastrointestinal:  ( - ) nausea, ( - ) heartburn, ( - ) change in bowel habits Skin: ( - ) abnormal skin rashes Lymphatics: ( - ) new lymphadenopathy, ( - ) easy bruising Neurological: ( - ) numbness, ( - ) tingling, ( - ) new weaknesses Behavioral/Psych: ( + ) mood change, ( - ) new changes  All other systems were reviewed with the patient and are negative.  PHYSICAL EXAMINATION: ECOG PERFORMANCE STATUS: 1 - Symptomatic but completely ambulatory  Vitals:   05/15/19 0853 05/15/19 0855  BP: (!) 149/93 (!) 150/82  Pulse: 66   Resp: 17   Temp: 98.1 F (36.7 C)   SpO2: 100%    Filed Weights   05/15/19 0853  Weight: 228 lb 3.2 oz (103.5 kg)    GENERAL: well appearing elderly Caucasian male in NAD  SKIN: skin color, texture, turgor are normal, no rashes or significant lesions EYES: conjunctiva are pink and non-injected, sclera clear LUNGS: clear to auscultation and percussion with normal breathing effort HEART: regular rate & rhythm and no murmurs and no lower extremity edema Musculoskeletal: no cyanosis of digits and no clubbing  PSYCH: alert & oriented x 3, fluent speech NEURO: no focal motor/sensory deficits  LABORATORY DATA:  I have  reviewed the data as listed CBC Latest Ref Rng & Units 05/15/2019 04/03/2019 10/10/2018  WBC 4.0 - 10.5 K/uL 7.8 7.1 6.7  Hemoglobin 13.0 - 17.0 g/dL 12.9(L) 12.4(L) 11.5(L)  Hematocrit 39.0 - 52.0 % 43.9 40.3 39.1  Platelets 150 - 400 K/uL 200 231 265    CMP Latest Ref Rng & Units 05/15/2019 04/03/2019 10/10/2018  Glucose 70 - 99 mg/dL 115(H) 113(H) 144(H)  BUN 8 - 23 mg/dL _0 Creatinine 0.61 - 1.24 mg/dL 0.85 0.96 1.16  Sodium 135 - 145 mmol/L 140 140 141  Potassium 3.5 - 5.1 mmol/L 4.3 4.6 4.3  Chloride 98 - 111 mmol/L 108 103 102  CO2 22 - 32 mmol/L _1 Calcium 8.9 - 10.3 mg/dL 9.0 9.2 9.0  Total Protein 6.5 - 8.1 g/dL 6.6 6.8 6.6  Total Bilirubin 0.3 - 1.2 mg/dL 0.9 1.1 0.6  Alkaline Phos 38 - 126 U/L 51 50 63  AST 15 - 41 U/L _2 ALT 0 - 44 U/L _3 PATHOLOGY: None relevant to review.   BLOOD FILM:  Review of the peripheral blood smear showed normal appearing white cells with neutrophils that were appropriately lobated and granulated. There was no predominance of bi-lobed or hyper-segmented neutrophils appreciated. No Dohle bodies were noted. There was no left shifting, immature forms or blasts noted. Lymphocytes remain normal in size without any predominance of large granular lymphocytes. Rare reactive lymphocyte. Red cells show no anisopoikilocytosis, macrocytes , microcytes or polychromasia. There were no schistocytes, target cells, echinocytes, acanthocytes, dacrocytes, or stomatocytes.There was no rouleaux formation, nucleated red cells, or intra-cellular inclusions noted. The platelets are normal in size, shape, and color without any clumping evident.  RADIOGRAPHIC STUDIES: None relevant to review.  No results found.  ASSESSMENT &  PLAN Jason Rogers 64 y.o. male with medical history significant for HTN, HLD, and DM type II who presents for evaluation of iron deficiency anemia.  After review the labs and discussion with the patient his findings are  consistent with an iron deficiency anemia secondary to blood loss.  The most likely etiology of his blood loss is his frequent donations of blood every 2 months.  The reason why the patient is now iron deficient as opposed to previously is not entirely clear.  It is possible the patient has had decreased dietary absorption of iron versus an additional alternative source of blood loss which is resulted in his iron deficiency anemia.  Either way I think it would be appropriate at this time to discontinue blood transfusions and to start p.o. iron therapy with 325 mg of ferrous sulfate.  I would recommend we have the patient return in 3 months time to reevaluate and determine if his iron levels and anemia have resolved.  In the event that he still remains low even after stopping blood transfusion and taking appropriate therapy we would need to consider GI evaluation and/or administration of IV iron.  #Iron Deficiency Anemia, Unclear Source --today will order CBC, CMP, retic panel, and iron profile --findings are most consistent with iron deficiency anemia 2/2 to chronic blood loss via q2 month blood donations. Today I have requested the patient stop providing blood donations effective immediately.  --non findings that are suspicious for a GI bleed. Will make a referral to GI for consideration of colonoscopy/EGD in the event his levels do not improve with PO therapy and stopping blood donations.  --today will prescribe iron sulfate 374m PO daily, to be taken with a source of vitamin C --no clear indication for IV iron or bone marrow biopsy at this time  --RTC in 3 months to re-evaluate.   Orders Placed This Encounter  Procedures   CBC with Differential ([place])    Standing Status:   Future    Number of Occurrences:   1    Standing Expiration Date:   05/14/2020   Retic Panel    Standing Status:   Future    Number of Occurrences:   1    Standing Expiration Date:   05/14/2020   Save Smear  ([facility])    Standing Status:   Future    Number of Occurrences:   1    Standing Expiration Date:   05/14/2020   CMP ([place])    Standing Status:   Future    Number of Occurrences:   1    Standing Expiration Date:   05/14/2020   Iron and TIBC    Standing Status:   Future    Number of Occurrences:   1    Standing Expiration Date:   05/14/2020   Ferritin    Standing Status:   Future    Number of Occurrences:   1    Standing Expiration Date:   05/14/2020   All questions were answered. The patient knows to call the clinic with any problems, questions or concerns.  A total of more than 60 minutes were spent on this encounter and over half of that time was spent on counseling and coordination of care as outlined above.   JLedell Peoples MD Department of Hematology/Oncology CAlexanderat WSt. [facility]oof Medical CenterPhone: [phone]: [phone]: [email or web address]  05/15/2019 11:35 AM

## 2019-05-15 ENCOUNTER — Other Ambulatory Visit: Payer: Self-pay

## 2019-05-15 ENCOUNTER — Inpatient Hospital Stay: Payer: Medicare HMO | Attending: Hematology and Oncology

## 2019-05-15 ENCOUNTER — Inpatient Hospital Stay: Payer: Medicare HMO | Admitting: Hematology and Oncology

## 2019-05-15 VITALS — BP 150/82 | HR 66 | Temp 98.1°F | Resp 17 | Ht 72.0 in | Wt 228.2 lb

## 2019-05-15 DIAGNOSIS — D500 Iron deficiency anemia secondary to blood loss (chronic): Secondary | ICD-10-CM | POA: Diagnosis not present

## 2019-05-15 DIAGNOSIS — R5383 Other fatigue: Secondary | ICD-10-CM | POA: Insufficient documentation

## 2019-05-15 DIAGNOSIS — Z79899 Other long term (current) drug therapy: Secondary | ICD-10-CM | POA: Insufficient documentation

## 2019-05-15 DIAGNOSIS — Z794 Long term (current) use of insulin: Secondary | ICD-10-CM | POA: Diagnosis not present

## 2019-05-15 DIAGNOSIS — I10 Essential (primary) hypertension: Secondary | ICD-10-CM | POA: Insufficient documentation

## 2019-05-15 DIAGNOSIS — E785 Hyperlipidemia, unspecified: Secondary | ICD-10-CM | POA: Diagnosis not present

## 2019-05-15 DIAGNOSIS — Z7982 Long term (current) use of aspirin: Secondary | ICD-10-CM | POA: Diagnosis not present

## 2019-05-15 DIAGNOSIS — E119 Type 2 diabetes mellitus without complications: Secondary | ICD-10-CM | POA: Diagnosis not present

## 2019-05-15 DIAGNOSIS — D509 Iron deficiency anemia, unspecified: Secondary | ICD-10-CM | POA: Insufficient documentation

## 2019-05-15 LAB — RETIC PANEL
Immature Retic Fract: 24.7 % — ABNORMAL HIGH (ref 2.3–15.9)
RBC.: 5.9 MIL/uL — ABNORMAL HIGH (ref 4.22–5.81)
Retic Count, Absolute: 56.6 10*3/uL (ref 19.0–186.0)
Retic Ct Pct: 1 % (ref 0.4–3.1)
Reticulocyte Hemoglobin: 24.5 pg — ABNORMAL LOW (ref >27.9)

## 2019-05-15 LAB — IRON AND TIBC
Iron: 23 ug/dL — ABNORMAL LOW (ref 42–163)
Saturation Ratios: 5 % — ABNORMAL LOW (ref 20–55)
TIBC: 440 ug/dL — ABNORMAL HIGH (ref 202–409)
UIBC: 417 ug/dL — ABNORMAL HIGH (ref 117–376)

## 2019-05-15 LAB — CBC WITH DIFFERENTIAL (CANCER CENTER ONLY)
Abs Immature Granulocytes: 0.03 10*3/uL (ref 0.00–0.07)
Basophils Absolute: 0.1 10*3/uL (ref 0.0–0.1)
Basophils Relative: 1 %
Eosinophils Absolute: 0.2 10*3/uL (ref 0.0–0.5)
Eosinophils Relative: 3 %
HCT: 43.9 % (ref 39.0–52.0)
Hemoglobin: 12.9 g/dL — ABNORMAL LOW (ref 13.0–17.0)
Immature Granulocytes: 0 %
Lymphocytes Relative: 22 %
Lymphs Abs: 1.7 10*3/uL (ref 0.7–4.0)
MCH: 21.9 pg — ABNORMAL LOW (ref 26.0–34.0)
MCHC: 29.4 g/dL — ABNORMAL LOW (ref 30.0–36.0)
MCV: 74.7 fL — ABNORMAL LOW (ref 80.0–100.0)
Monocytes Absolute: 0.7 10*3/uL (ref 0.1–1.0)
Monocytes Relative: 10 %
Neutro Abs: 5 10*3/uL (ref 1.7–7.7)
Neutrophils Relative %: 64 %
Platelet Count: 200 10*3/uL (ref 150–400)
RBC: 5.88 MIL/uL — ABNORMAL HIGH (ref 4.22–5.81)
RDW: 17.4 % — ABNORMAL HIGH (ref 11.5–15.5)
WBC Count: 7.8 10*3/uL (ref 4.0–10.5)
nRBC: 0 % (ref 0.0–0.2)

## 2019-05-15 LAB — CMP (CANCER CENTER ONLY)
ALT: 16 U/L (ref 0–44)
AST: 16 U/L (ref 15–41)
Albumin: 3.9 g/dL (ref 3.5–5.0)
Alkaline Phosphatase: 51 U/L (ref 38–126)
Anion gap: 7 (ref 5–15)
BUN: 18 mg/dL (ref 8–23)
CO2: 25 mmol/L (ref 22–32)
Calcium: 9 mg/dL (ref 8.9–10.3)
Chloride: 108 mmol/L (ref 98–111)
Creatinine: 0.85 mg/dL (ref 0.61–1.24)
GFR, Est AFR Am: >60 mL/min (ref >60)
GFR, Estimated: >60 mL/min (ref >60)
Glucose, Bld: 115 mg/dL — ABNORMAL HIGH (ref 70–99)
Potassium: 4.3 mmol/L (ref 3.5–5.1)
Sodium: 140 mmol/L (ref 135–145)
Total Bilirubin: 0.9 mg/dL (ref 0.3–1.2)
Total Protein: 6.6 g/dL (ref 6.5–8.1)

## 2019-05-15 LAB — FERRITIN: Ferritin: 4 ng/mL — ABNORMAL LOW (ref 24–336)

## 2019-05-15 LAB — SAVE SMEAR(SSMR), FOR PROVIDER SLIDE REVIEW

## 2019-05-15 MED ORDER — FERROUS SULFATE 325 (65 FE) MG PO TABS: 325.0000 mg | ORAL_TABLET | Freq: Every evening | ORAL | 3 refills | Status: DC

## 2019-05-18 ENCOUNTER — Telehealth: Payer: Self-pay | Admitting: Hematology and Oncology

## 2019-05-18 NOTE — Telephone Encounter (Signed)
Scheduled per los. Called and spoke with patients wife. Confirmed appt  

## 2019-05-20 ENCOUNTER — Telehealth: Payer: Self-pay | Admitting: *Deleted

## 2019-05-20 NOTE — Telephone Encounter (Signed)
-----   Message from Orson Slick, MD sent at 05/20/2019  9:42 AM EDT ----- Please call Jason Rogers to let him know our labs findings were consistent with iron deficiency anemia. He had ferritin of 4 (normal is 30), iron sat 5% (normal 24%) and a mild anemia. Please assure he is taking his PO iron as prescribed. We will f/u with him in 3 months time.  [name]  ----- Message ----- From: Buel Ream, Lab In [place] Sent: 05/15/2019  10:22 AM EDT To: Orson Slick, MD

## 2019-05-20 NOTE — Telephone Encounter (Signed)
TCT patient regarding his recent lab results.  No answer but was able to leave vm message for pt to return call @ [phone].

## 2019-05-22 ENCOUNTER — Telehealth: Payer: Self-pay | Admitting: *Deleted

## 2019-05-22 NOTE — Telephone Encounter (Signed)
Received vm message from pt's wife, stating that she received the vm message sent earlier. Attempted call back. No answer. Left vm

## 2019-05-22 NOTE — Telephone Encounter (Signed)
Attempted call to patient regarding lab results. No answer again and left vm message for pt to call back at his convenience.

## 2019-06-02 ENCOUNTER — Ambulatory Visit: Payer: Medicare HMO | Attending: Internal Medicine

## 2019-06-02 DIAGNOSIS — Z23 Encounter for immunization: Secondary | ICD-10-CM

## 2019-06-02 NOTE — Progress Notes (Signed)
   Covid-19 Vaccination Clinic  Name:  Jason Rogers    MRN: [id number] DOB: 1955-09-14  06/02/2019  [name] was observed post Covid-19 immunization for 15 minutes without incident. He was provided with Vaccine Information Sheet and instruction to access the V-Safe system.   [name] was instructed to call 911 with any severe reactions post vaccine: Marland Kitchen Difficulty breathing  . Swelling of face and throat  . A fast heartbeat  . A bad rash all over body  . Dizziness and weakness   Immunizations Administered    Name Date Dose VIS Date Route   Moderna COVID-19 Vaccine 06/02/2019  8:38 AM 0.5 mL 01/27/2019 Intramuscular   Manufacturer: Moderna   [id number]   [id number]

## 2019-07-03 ENCOUNTER — Other Ambulatory Visit: Payer: Self-pay

## 2019-07-03 ENCOUNTER — Encounter: Payer: Self-pay | Admitting: Nurse Practitioner

## 2019-07-03 ENCOUNTER — Ambulatory Visit (INDEPENDENT_AMBULATORY_CARE_PROVIDER_SITE_OTHER): Payer: Medicare HMO | Admitting: Nurse Practitioner

## 2019-07-03 VITALS — BP 132/74 | HR 68 | Temp 96.8°F | Resp 20 | Ht 72.0 in | Wt 228.0 lb

## 2019-07-03 DIAGNOSIS — L237 Allergic contact dermatitis due to plants, except food: Secondary | ICD-10-CM | POA: Insufficient documentation

## 2019-07-03 MED ORDER — PREDNISONE 10 MG (21) PO TBPK: ORAL_TABLET | ORAL | 0 refills | Status: DC

## 2019-07-03 MED ORDER — BENADRYL ITCH STOPPING 1-0.1 % EX CREA: TOPICAL_CREAM | Freq: Three times a day (TID) | CUTANEOUS | 1 refills | Status: DC | PRN

## 2019-07-03 MED ORDER — METHYLPREDNISOLONE ACETATE 80 MG/ML IJ SUSP
40.0000 mg | Freq: Once | INTRAMUSCULAR | Status: AC
  Administered 2019-07-03: 40 mg via INTRAMUSCULAR

## 2019-07-03 NOTE — Patient Instructions (Addendum)
Patients symptoms are not well controlled, provided education to patient to prevent future exposures, started prednisone taper and a DepoMedrol shot 40 mg. patient knows to follow up as needed for worsening or unresolved symptoms.   Poison Ivy Dermatitis Poison ivy dermatitis is redness and soreness of the skin caused by chemicals in the leaves of the poison ivy plant. You may have very bad itching, swelling, a rash, and blisters. What are the causes?  Touching a poison ivy plant.  Touching something that has the chemical on it. This may include animals or objects that have come in contact with the plant. What increases the risk?  Going outdoors often in wooded or [place] areas.  Going outdoors without wearing protective clothing, such as closed shoes, long pants, and a long-sleeved shirt. What are the signs or symptoms?   Skin redness.  Very bad itching.  A rash that often includes bumps and blisters. ? The rash usually appears 48 hours after exposure, if you have been exposed before. ? If this is the first time you have been exposed, the rash may not appear until a week after exposure.  Swelling. This may occur if the reaction is very bad. Symptoms usually last for 1-2 weeks. The first time you develop this condition, symptoms may last 3-4 weeks. How is this treated? This condition may be treated with:  Hydrocortisone cream or calamine lotion to relieve itching.  Oatmeal baths to soothe the skin.  Medicines, such as over-the-counter antihistamine tablets.  Oral steroid medicine for more severe reactions. Follow these instructions at home: Medicines  Take or apply over-the-counter and prescription medicines only as told by your doctor.  Use hydrocortisone cream or calamine lotion as needed to help with itching. General instructions  Do not scratch or rub your skin.  Put a cold, wet cloth (cold compress) on the affected areas or take baths in cool water. This will help  with itching.  Avoid hot baths and showers.  Take oatmeal baths as needed. Use colloidal oatmeal. You can get this at a pharmacy or grocery store. Follow the instructions on the package.  While you have the rash, wash your clothes right after you wear them.  Keep all follow-up visits as told by your health care provider. This is important. How is this prevented?   Know what poison ivy looks like, so you can avoid it. ? This plant has three leaves with flowering branches on a single stem. ? The leaves are glossy. ? The leaves have uneven edges that come to a point at the front.  If you touch poison ivy, wash your skin with soap and water right away. Be sure to wash under your fingernails.  When hiking or camping, wear long pants, a long-sleeved shirt, tall socks, and hiking boots. You can also use a lotion on your skin that helps to prevent contact with poison ivy.  If you think that your clothes or outdoor gear came in contact with poison ivy, rinse them off with a garden hose before you bring them inside your house.  When doing yard work or gardening, wear gloves, long sleeves, long pants, and boots. Wash your garden tools and gloves if they come in contact with poison ivy.  If you think that your pet has come into contact with poison ivy, wash him or her with pet shampoo and water. Make sure to wear gloves while washing your pet. Contact a doctor if:  You have open sores in the rash area.  You have more redness, swelling, or pain in the rash area.  You have redness that spreads beyond the rash area.  You have fluid, blood, or pus coming from the rash area.  You have a fever.  You have a rash over a large area of your body.  You have a rash on your eyes, mouth, or genitals.  Your rash does not get better after a few weeks. Get help right away if:  Your face swells or your eyes swell shut.  You have trouble breathing.  You have trouble swallowing. These symptoms may  be an emergency. Do not wait to see if the symptoms will go away. Get medical help right away. Call your local emergency services (911 in the [place] Do not drive yourself to the hospital. Summary  Poison ivy dermatitis is redness and soreness of the skin caused by chemicals in the leaves of the poison ivy plant.  You may have skin redness, very bad itching, swelling, and a rash.  Do not scratch or rub your skin.  Take or apply over-the-counter and prescription medicines only as told by your doctor. This information is not intended to replace advice given to you by your health care provider. Make sure you discuss any questions you have with your health care provider. Document Revised: 06/06/2018 Document Reviewed: 02/07/2018 Elsevier Patient Education  2020 [employer].

## 2019-07-03 NOTE — Progress Notes (Signed)
Acute Office Visit  Subjective:    Patient ID: Jason Rogers, male    DOB: 09-18-1955, 64 y.o.   MRN: [id number]  Chief Complaint  Patient presents with  . Poison Oak    all over     [employer] This is a new problem. The current episode started in the past 7 days. The problem has been gradually worsening since onset. The affected locations include the left hand, left lower leg, left upper leg, left shoulder, right arm, left arm, face, neck and right hand. The rash is characterized by redness and itchiness. He was exposed to plant contact. Associated symptoms include shortness of breath. Pertinent negatives include no cough, eye pain or fever. Past treatments include nothing.    Past Medical History:  Diagnosis Date  . Allergy   . Diabetes mellitus without complication ([place])   . Hyperlipidemia   . Hypertension     Past Surgical History:  Procedure Laterality Date  . HERNIA REPAIR    . TESTICLE SURGERY    . TONSILLECTOMY AND ADENOIDECTOMY    . VASECTOMY      Family History  Problem Relation Age of Onset  . Diabetes Mother   . Heart disease Father     Social History   Socioeconomic History  . Marital status: Married    Spouse name: Not on file  . Number of children: Not on file  . Years of education: Not on file  . Highest education level: Not on file  Occupational History  . Not on file  Tobacco Use  . Smoking status: Never Smoker  . Smokeless tobacco: Never Used  Substance and Sexual Activity  . Alcohol use: Yes    Comment: rare  . Drug use: No  . Sexual activity: Not on file  Other Topics Concern  . Not on file  Social History Narrative  . Not on file   Social Determinants of Health   Financial Resource Strain:   . Difficulty of Paying Living Expenses:   Food Insecurity:   . Worried About Charity fundraiser in the Last Year:   . Arboriculturist in the Last Year:   Transportation Needs:   . Film/video editor (Medical):   Marland Kitchen Lack of  Transportation (Non-Medical):   Physical Activity:   . Days of Exercise per Week:   . Minutes of Exercise per Session:   Stress:   . Feeling of Stress :   Social Connections:   . Frequency of Communication with Friends and Family:   . Frequency of Social Gatherings with Friends and Family:   . Attends Religious Services:   . Active Member of Clubs or Organizations:   . Attends Archivist Meetings:   Marland Kitchen Marital Status:   Intimate Partner Violence:   . Fear of Current or Ex-Partner:   . Emotionally Abused:   Marland Kitchen Physically Abused:   . Sexually Abused:     Outpatient Medications Prior to Visit  Medication Sig Dispense Refill  . aspirin 81 MG tablet Take 81 mg by mouth daily.    . cholecalciferol (VITAMIN D3) 25 MCG (1000 UNIT) tablet Take 1,000 Units by mouth daily.    . Dulaglutide (TRULICITY) 1.5 0000000 SOPN INJECT 1 SYRINGE SUBCUTANEOUSLY ONCE A WEEK 12 mL 0  . empagliflozin (JARDIANCE) 25 MG TABS tablet Take 25 mg by mouth daily. 90 tablet 3  . ferrous sulfate 325 (65 FE) MG tablet Take 1 tablet (325 mg total) by mouth  at bedtime. 90 tablet 3  . insulin degludec (TRESIBA FLEXTOUCH) 100 UNIT/ML SOPN FlexTouch Pen INJECT 20 UNITS SUBCUTANEOUSLY AT BEDTIME 15 mL 0  . lisinopril (ZESTRIL) 20 MG tablet Take 1 tablet (20 mg total) by mouth daily. 90 tablet 3  . loratadine (CLARITIN) 10 MG tablet Take 1 tablet (10 mg total) by mouth daily. 90 tablet 3  . metFORMIN (GLUCOPHAGE-XR) 500 MG 24 hr tablet TAKE 3 TABLETS BY MOUTH ONCE DAILY WITH BREAKFAST 270 tablet 1  . omeprazole (PRILOSEC) 20 MG capsule Take 1 capsule (20 mg total) by mouth daily. 90 capsule 2  . rosuvastatin (CRESTOR) 20 MG tablet Take 1 tablet (20 mg total) by mouth at bedtime. 90 tablet 2   No facility-administered medications prior to visit.    Allergies  Allergen Reactions  . Lipitor [Atorvastatin]     Depressed    Review of Systems  Constitutional: Negative for activity change, appetite change and  fever.  HENT: Negative for facial swelling.   Eyes: Negative for pain, discharge and redness.  Respiratory: Positive for shortness of breath. Negative for cough and chest tightness.   Cardiovascular: Negative for palpitations.  Gastrointestinal: Negative for nausea.  Musculoskeletal: Negative for arthralgias and myalgias.  Skin: Positive for color change and rash.  Psychiatric/Behavioral: The patient is not nervous/anxious.        Objective:    Physical Exam Constitutional:      Appearance: Normal appearance.  HENT:     Head: Normocephalic.     Nose: Nose normal.  Eyes:     Conjunctiva/sclera: Conjunctivae normal.  Cardiovascular:     Rate and Rhythm: Normal rate and regular rhythm.     Pulses: Normal pulses.     Heart sounds: Normal heart sounds.  Pulmonary:     Effort: Pulmonary effort is normal.     Breath sounds: Normal breath sounds.  Abdominal:     General: Bowel sounds are normal.  Musculoskeletal:        General: No tenderness.     Cervical back: Neck supple.  Skin:    Findings: Erythema and rash present.  Neurological:     Mental Status: He is alert and oriented to person, place, and time.  Psychiatric:        Mood and Affect: Mood normal.        Behavior: Behavior normal.     BP 132/74   Pulse 68   Temp (!) 96.8 F (36 C)   Resp 20   Ht 6' (1.829 m)   Wt 228 lb (103.4 kg)   SpO2 97%   BMI 30.92 kg/m  Wt Readings from Last 3 Encounters:  07/03/19 228 lb (103.4 kg)  05/15/19 228 lb 3.2 oz (103.5 kg)  04/03/19 230 lb (104.3 kg)    Health Maintenance Due  Topic Date Due  . OPHTHALMOLOGY EXAM  09/28/2018    There are no preventive care reminders to display for this patient.   Lab Results  Component Value Date   TSH 4.110 10/10/2018   Lab Results  Component Value Date   WBC 7.8 05/15/2019   HGB 12.9 (L) 05/15/2019   HCT 43.9 05/15/2019   MCV 74.7 (L) 05/15/2019   PLT 200 05/15/2019   Lab Results  Component Value Date   NA 140  05/15/2019   K 4.3 05/15/2019   CO2 25 05/15/2019   GLUCOSE 115 (H) 05/15/2019   BUN 18 05/15/2019   CREATININE 0.85 05/15/2019   BILITOT 0.9 05/15/2019  ALKPHOS 51 05/15/2019   AST 16 05/15/2019   ALT 16 05/15/2019   PROT 6.6 05/15/2019   ALBUMIN 3.9 05/15/2019   CALCIUM 9.0 05/15/2019   ANIONGAP 7 05/15/2019   Lab Results  Component Value Date   CHOL 143 10/10/2018   Lab Results  Component Value Date   HDL 37 (L) 10/10/2018   Lab Results  Component Value Date   LDLCALC 61 10/10/2018   Lab Results  Component Value Date   TRIG 224 (H) 10/10/2018   Lab Results  Component Value Date   CHOLHDL 3.9 10/10/2018   Lab Results  Component Value Date   HGBA1C 7.7 (H) 04/03/2019       Assessment & Plan:   Problem List Items Addressed This Visit      Musculoskeletal and Integument   Poison oak - Primary    Patients symptoms are not well controlled, provided education to patient to prevent future exposures, started prednisone taper and a DepoMedrol shot 40 mg. patient knows to follow up as needed for worsening or unresolved symptoms.      Relevant Medications   predniSONE (STERAPRED UNI-PAK 21 TAB) 10 MG (21) TBPK tablet   diphenhydrAMINE-zinc acetate (BENADRYL ITCH STOPPING) cream       Meds ordered this encounter  Medications  . methylPREDNISolone acetate (DEPO-MEDROL) injection 40 mg  . predniSONE (STERAPRED UNI-PAK 21 TAB) 10 MG (21) TBPK tablet    Sig: 60 mg tablet by mouth day 1, 50 mg day 2, 40 mg day 3, 30  Mg day 4, 20 mg day 5, 10 mg day 6    Dispense:  1 each    Refill:  0    Order Specific Question:   Supervising Provider    Answer:   Caryl Pina A [id number]  . diphenhydrAMINE-zinc acetate (BENADRYL ITCH STOPPING) cream    Sig: Apply topically 3 (three) times daily as needed for itching.    Dispense:  28.3 g    Refill:  1    Order Specific Question:   Supervising Provider    Answer:   Caryl Pina A [id number]     Ivy Lynn,  NP

## 2019-07-03 NOTE — Assessment & Plan Note (Signed)
Patients symptoms are not well controlled, provided education to patient to prevent future exposures, started prednisone taper and a DepoMedrol shot 40 mg. patient knows to follow up as needed for worsening or unresolved symptoms.

## 2019-07-04 ENCOUNTER — Other Ambulatory Visit: Payer: Self-pay | Admitting: Family

## 2019-07-04 DIAGNOSIS — I152 Hypertension secondary to endocrine disorders: Secondary | ICD-10-CM

## 2019-07-04 DIAGNOSIS — E1159 Type 2 diabetes mellitus with other circulatory complications: Secondary | ICD-10-CM

## 2019-07-10 DIAGNOSIS — E1165 Type 2 diabetes mellitus with hyperglycemia: Secondary | ICD-10-CM | POA: Diagnosis not present

## 2019-07-17 DIAGNOSIS — E1169 Type 2 diabetes mellitus with other specified complication: Secondary | ICD-10-CM | POA: Diagnosis not present

## 2019-07-17 DIAGNOSIS — E785 Hyperlipidemia, unspecified: Secondary | ICD-10-CM | POA: Diagnosis not present

## 2019-07-20 ENCOUNTER — Telehealth: Payer: Self-pay | Admitting: Family

## 2019-07-20 ENCOUNTER — Other Ambulatory Visit: Payer: Self-pay | Admitting: *Deleted

## 2019-07-20 DIAGNOSIS — E1165 Type 2 diabetes mellitus with hyperglycemia: Secondary | ICD-10-CM

## 2019-07-20 MED ORDER — TRULICITY 1.5 MG/0.5ML ~~LOC~~ SOAJ: SUBCUTANEOUS | 0 refills | Status: DC

## 2019-07-20 NOTE — Telephone Encounter (Signed)
Spoke with pt's wife and she says they are working on the paperwork from Endocrinology to get rx assistance on Trulicity. Advised we do have some samples he can pick up till they see if they are able to get the rx assistance.

## 2019-07-29 ENCOUNTER — Ambulatory Visit (INDEPENDENT_AMBULATORY_CARE_PROVIDER_SITE_OTHER): Payer: Medicare HMO | Admitting: Family Medicine

## 2019-07-29 ENCOUNTER — Encounter: Payer: Self-pay | Admitting: Family Medicine

## 2019-07-29 DIAGNOSIS — J40 Bronchitis, not specified as acute or chronic: Secondary | ICD-10-CM

## 2019-07-29 DIAGNOSIS — J329 Chronic sinusitis, unspecified: Secondary | ICD-10-CM

## 2019-07-29 MED ORDER — PSEUDOEPHEDRINE-GUAIFENESIN ER 120-1200 MG PO TB12
1.0000 | ORAL_TABLET | Freq: Two times a day (BID) | ORAL | 1 refills | Status: DC
Start: 2019-07-29 — End: 2019-12-04

## 2019-07-29 MED ORDER — AMOXICILLIN-POT CLAVULANATE 875-125 MG PO TABS: 1.0000 | ORAL_TABLET | Freq: Two times a day (BID) | ORAL | 0 refills | Status: DC

## 2019-07-29 NOTE — Progress Notes (Signed)
Subjective:    Patient ID: Jason Rogers, male    DOB: 05-Mar-1955, 64 y.o.   MRN: [id number]   HPI: Jason Rogers is a 64 y.o. male Symptoms include head congestion, pain around the eyes, nasal congestion, non productive cough, post nasal drip.. There is no fever, chills, or sweats. Onset of symptoms was 5 days ago, gradually worsening since that time. No relief with OTC meds. Cough bringing up clear phlegm.     Depression screen [facility] 2/9 07/03/2019 04/03/2019 12/24/2018 03/07/2018 09/11/2017  Decreased Interest 0 0 0 0 0  Down, Depressed, Hopeless 0 0 0 0 0  PHQ - 2 Score 0 0 0 0 0  Altered sleeping - - 0 - -  Tired, decreased energy - - 0 - -  Change in appetite - - 0 - -  Feeling bad or failure about yourself  - - 0 - -  Trouble concentrating - - 0 - -  Moving slowly or fidgety/restless - - 0 - -  Suicidal thoughts - - 0 - -  PHQ-9 Score - - 0 - -     Relevant past medical, surgical, family and social history reviewed and updated as indicated.  Interim medical history since our last visit reviewed. Allergies and medications reviewed and updated.  ROS:  Review of Systems  Constitutional: Negative for activity change, appetite change, chills and fever.  HENT: Positive for congestion, postnasal drip, rhinorrhea and sinus pressure. Negative for ear discharge, ear pain, hearing loss, nosebleeds, sneezing and trouble swallowing.   Respiratory: Negative for chest tightness and shortness of breath.   Cardiovascular: Negative for chest pain and palpitations.  Skin: Negative for rash.     Social History   Tobacco Use  Smoking Status Never Smoker  Smokeless Tobacco Never Used       Objective:     Wt Readings from Last 3 Encounters:  07/03/19 228 lb (103.4 kg)  05/15/19 228 lb 3.2 oz (103.5 kg)  04/03/19 230 lb (104.3 kg)     Exam deferred. Pt. Harbor[place] due to COVID 19. Phone visit performed.   Assessment & Plan:   1. Sinobronchitis     Meds ordered this  encounter  Medications  . Pseudoephedrine-Guaifenesin [phone] MG TB12    Sig: Take 1 tablet by mouth 2 (two) times daily. For congestion    Dispense:  12 tablet    Refill:  1  . amoxicillin-clavulanate (AUGMENTIN) 875-125 MG tablet    Sig: Take 1 tablet by mouth 2 (two) times daily. Take all of this medication    Dispense:  20 tablet    Refill:  0    No orders of the defined types were placed in this encounter.     Diagnoses and all orders for this visit:  Sinobronchitis  Other orders -     Pseudoephedrine-Guaifenesin [phone] MG TB12; Take 1 tablet by mouth 2 (two) times daily. For congestion -     amoxicillin-clavulanate (AUGMENTIN) 875-125 MG tablet; Take 1 tablet by mouth 2 (two) times daily. Take all of this medication    Virtual Visit via telephone Note  I discussed the limitations, risks, security and privacy concerns of performing an evaluation and management service by telephone and the availability of in person appointments. The patient was identified with two identifiers. Pt.expressed understanding and agreed to proceed. Pt. Is at home. Dr. Livia Snellen is in his office.  Follow Up Instructions:   I discussed the assessment and treatment plan with the  patient. The patient was provided an opportunity to ask questions and all were answered. The patient agreed with the plan and demonstrated an understanding of the instructions.   The patient was advised to call back or seek an in-person evaluation if the symptoms worsen or if the condition fails to improve as anticipated.   Total minutes including chart review and phone contact time: 9   Follow up plan: Return if symptoms worsen or fail to improve.  Claretta Fraise, MD [place]

## 2019-08-07 ENCOUNTER — Telehealth: Payer: Self-pay | Admitting: Family

## 2019-08-07 NOTE — Telephone Encounter (Signed)
Patient talked to [facility]

## 2019-08-17 ENCOUNTER — Other Ambulatory Visit: Payer: Self-pay | Admitting: Hematology and Oncology

## 2019-08-17 ENCOUNTER — Inpatient Hospital Stay: Payer: Medicare HMO | Admitting: Hematology and Oncology

## 2019-08-17 DIAGNOSIS — D500 Iron deficiency anemia secondary to blood loss (chronic): Secondary | ICD-10-CM

## 2019-08-20 NOTE — Telephone Encounter (Signed)
Spoke with patient's wife.  Patient assistance application is missing proof of income.  Patient or wife will bring in this information when back from vacation

## 2019-08-24 ENCOUNTER — Telehealth: Payer: Self-pay | Admitting: Family

## 2019-08-24 NOTE — Telephone Encounter (Signed)
We were calling to get Proof of income to complete package for RX assistance for Jardiance but wife states they received a letter 08/12/19 from [employer] stating the package was received and he was approved through 02/26/20 and they have already received the first shipment.

## 2019-08-24 NOTE — Telephone Encounter (Signed)
For review

## 2019-08-25 ENCOUNTER — Telehealth: Payer: Self-pay | Admitting: Family

## 2019-08-25 NOTE — Telephone Encounter (Signed)
Left message to please call our office and ask for Home Health nurse for questions.

## 2019-08-27 ENCOUNTER — Other Ambulatory Visit: Payer: Self-pay | Admitting: Hematology and Oncology

## 2019-08-27 DIAGNOSIS — D500 Iron deficiency anemia secondary to blood loss (chronic): Secondary | ICD-10-CM

## 2019-08-28 ENCOUNTER — Other Ambulatory Visit: Payer: Self-pay

## 2019-08-28 ENCOUNTER — Inpatient Hospital Stay: Payer: Medicare HMO

## 2019-08-28 ENCOUNTER — Encounter: Payer: Self-pay | Admitting: Hematology and Oncology

## 2019-08-28 ENCOUNTER — Inpatient Hospital Stay: Payer: Medicare HMO | Attending: Hematology and Oncology | Admitting: Hematology and Oncology

## 2019-08-28 VITALS — BP 139/78 | HR 71 | Temp 97.7°F | Resp 18 | Ht 72.0 in | Wt 219.9 lb

## 2019-08-28 DIAGNOSIS — Z7982 Long term (current) use of aspirin: Secondary | ICD-10-CM | POA: Diagnosis not present

## 2019-08-28 DIAGNOSIS — D500 Iron deficiency anemia secondary to blood loss (chronic): Secondary | ICD-10-CM

## 2019-08-28 DIAGNOSIS — E785 Hyperlipidemia, unspecified: Secondary | ICD-10-CM | POA: Diagnosis not present

## 2019-08-28 DIAGNOSIS — I10 Essential (primary) hypertension: Secondary | ICD-10-CM | POA: Insufficient documentation

## 2019-08-28 DIAGNOSIS — Z7952 Long term (current) use of systemic steroids: Secondary | ICD-10-CM | POA: Diagnosis not present

## 2019-08-28 DIAGNOSIS — Z794 Long term (current) use of insulin: Secondary | ICD-10-CM | POA: Diagnosis not present

## 2019-08-28 DIAGNOSIS — E119 Type 2 diabetes mellitus without complications: Secondary | ICD-10-CM | POA: Diagnosis not present

## 2019-08-28 DIAGNOSIS — D696 Thrombocytopenia, unspecified: Secondary | ICD-10-CM | POA: Diagnosis not present

## 2019-08-28 DIAGNOSIS — D509 Iron deficiency anemia, unspecified: Secondary | ICD-10-CM | POA: Insufficient documentation

## 2019-08-28 DIAGNOSIS — R17 Unspecified jaundice: Secondary | ICD-10-CM | POA: Diagnosis not present

## 2019-08-28 DIAGNOSIS — Z79899 Other long term (current) drug therapy: Secondary | ICD-10-CM | POA: Diagnosis not present

## 2019-08-28 LAB — CMP (CANCER CENTER ONLY)
ALT: 22 U/L (ref 0–44)
AST: 19 U/L (ref 15–41)
Albumin: 4 g/dL (ref 3.5–5.0)
Alkaline Phosphatase: 51 U/L (ref 38–126)
Anion gap: 9 (ref 5–15)
BUN: 24 mg/dL — ABNORMAL HIGH (ref 8–23)
CO2: 27 mmol/L (ref 22–32)
Calcium: 9.2 mg/dL (ref 8.9–10.3)
Chloride: 107 mmol/L (ref 98–111)
Creatinine: 0.85 mg/dL (ref 0.61–1.24)
GFR, Est AFR Am: >60 mL/min (ref >60)
GFR, Estimated: >60 mL/min (ref >60)
Glucose, Bld: 113 mg/dL — ABNORMAL HIGH (ref 70–99)
Potassium: 4.6 mmol/L (ref 3.5–5.1)
Sodium: 143 mmol/L (ref 135–145)
Total Bilirubin: 1.9 mg/dL — ABNORMAL HIGH (ref 0.3–1.2)
Total Protein: 6.8 g/dL (ref 6.5–8.1)

## 2019-08-28 LAB — CBC WITH DIFFERENTIAL (CANCER CENTER ONLY)
Abs Immature Granulocytes: 0.04 10*3/uL (ref 0.00–0.07)
Basophils Absolute: 0 10*3/uL (ref 0.0–0.1)
Basophils Relative: 1 %
Eosinophils Absolute: 0.1 10*3/uL (ref 0.0–0.5)
Eosinophils Relative: 2 %
HCT: 47 % (ref 39.0–52.0)
Hemoglobin: 15.2 g/dL (ref 13.0–17.0)
Immature Granulocytes: 1 %
Lymphocytes Relative: 25 %
Lymphs Abs: 1.8 10*3/uL (ref 0.7–4.0)
MCH: 26.4 pg (ref 26.0–34.0)
MCHC: 32.3 g/dL (ref 30.0–36.0)
MCV: 81.7 fL (ref 80.0–100.0)
Monocytes Absolute: 0.7 10*3/uL (ref 0.1–1.0)
Monocytes Relative: 10 %
Neutro Abs: 4.6 10*3/uL (ref 1.7–7.7)
Neutrophils Relative %: 61 %
Platelet Count: 139 10*3/uL — ABNORMAL LOW (ref 150–400)
RBC: 5.75 MIL/uL (ref 4.22–5.81)
RDW: 17.6 % — ABNORMAL HIGH (ref 11.5–15.5)
WBC Count: 7.4 10*3/uL (ref 4.0–10.5)
nRBC: 0 % (ref 0.0–0.2)

## 2019-08-28 LAB — FERRITIN: Ferritin: 59 ng/mL (ref 24–336)

## 2019-08-28 LAB — IRON AND TIBC
Iron: 75 ug/dL (ref 42–163)
Saturation Ratios: 22 % (ref 20–55)
TIBC: 336 ug/dL (ref 202–409)
UIBC: 262 ug/dL (ref 117–376)

## 2019-08-28 LAB — RETIC PANEL
Immature Retic Fract: 6.5 % (ref 2.3–15.9)
RBC.: 5.73 MIL/uL (ref 4.22–5.81)
Retic Count, Absolute: 67 10*3/uL (ref 19.0–186.0)
Retic Ct Pct: 1.2 % (ref 0.4–3.1)
Reticulocyte Hemoglobin: 35.1 pg (ref >27.9)

## 2019-08-28 LAB — LACTATE DEHYDROGENASE: LDH: 177 U/L (ref 98–192)

## 2019-08-28 LAB — SAVE SMEAR(SSMR), FOR PROVIDER SLIDE REVIEW

## 2019-08-28 NOTE — Progress Notes (Signed)
[facility] [phone]   [phone]  PROGRESS NOTE  Patient Care Team: Junie Spencer, FNP as PCP - General (Nurse Practitioner)  Hematological/Oncological History # Iron Deficiency Anemia 2/2 to Chronic Blood Donation 1) 07/26/2017: WBC 8.1, Hgb 13.0, MCV 74, Plt 216.  2) 01/31/2018: WBC 7.3, Hgb 12.2, MCV 69, Plt 256 3) 10/10/2018: WBC 6.7, Hgb 11.5, MCV 70, Plt 265 Iron 20, TIBC 395, ferritin 10, iron sat 5% 4) 04/03/2019: WBC 7.1, Hgb 12.4, MCV 75, Plt 231. Iron 23, TIBC 426, ferritin 9, Iron sat 5% 5) 05/15/2019: establish care with Dr. Leonides Schanz   Interval History:  Jason Rogers 64 y.o. male with medical history significant for iron deficiency anemia 2/2 to chronic blood donation who presents for a follow up visit. The patient's last visit was on 05/15/2019 at which time he established care. In the interim since the last visit he has not donated any blood or had other medical issues.  On exam today Jason Rogers notes that he has been quite well.  He reports that he is not eating ice anymore and that his mood has since stabilized out.  He reports that the iron pills are causing her difficulty with no upset stomach or constipation.  He has had some unintentional weight loss with a drop from 228 pounds down to 219 pounds.  He recently returned from vacation and reportedly ate cake every night and therefore the reason for the drop is not entirely clear or plan for.  He reports he has had no issues with bleeding, bruising, or dark stools.  On further discussion he reports that he drinks very little in the way of alcohol and only had about 1 drink while he was on vacation.  He notes he has had some recent changes in his medications including increases in Metformin and Trulicity.  Symptomatically he reports that he feels quite well and that his energy levels are coming back and he is eager to once again start donating blood.  He denies having any issues with  fevers, chills, sweats, nausea, vomiting or diarrhea.  A full 10 point ROS is listed below.  MEDICAL HISTORY:  Past Medical History:  Diagnosis Date  . Allergy   . Diabetes mellitus without complication (HCC)   . Hyperlipidemia   . Hypertension     SURGICAL HISTORY: Past Surgical History:  Procedure Laterality Date  . HERNIA REPAIR    . TESTICLE SURGERY    . TONSILLECTOMY AND ADENOIDECTOMY    . VASECTOMY      SOCIAL HISTORY: Social History   Socioeconomic History  . Marital status: Married    Spouse name: Not on file  . Number of children: Not on file  . Years of education: Not on file  . Highest education level: Not on file  Occupational History  . Not on file  Tobacco Use  . Smoking status: Never Smoker  . Smokeless tobacco: Never Used  Vaping Use  . Vaping Use: Never used  Substance and Sexual Activity  . Alcohol use: Yes    Comment: rare  . Drug use: No  . Sexual activity: Not on file  Other Topics Concern  . Not on file  Social History Narrative  . Not on file   Social Determinants of Health   Financial Resource Strain:   . Difficulty of Paying Living Expenses:   Food Insecurity:   . Worried About Programme researcher, broadcasting/film/video in the Last Year:   . Ran  Out of Food in the Last Year:   Transportation Needs:   . Lack of Transportation (Medical):   Marland Kitchen Lack of Transportation (Non-Medical):   Physical Activity:   . Days of Exercise per Week:   . Minutes of Exercise per Session:   Stress:   . Feeling of Stress :   Social Connections:   . Frequency of Communication with Friends and Family:   . Frequency of Social Gatherings with Friends and Family:   . Attends Religious Services:   . Active Member of Clubs or Organizations:   . Attends Archivist Meetings:   Marland Kitchen Marital Status:   Intimate Partner Violence:   . Fear of Current or Ex-Partner:   . Emotionally Abused:   Marland Kitchen Physically Abused:   . Sexually Abused:     FAMILY HISTORY: Family History    Problem Relation Age of Onset  . Diabetes Mother   . Heart disease Father     ALLERGIES:  is allergic to lipitor [atorvastatin].  MEDICATIONS:  Current Outpatient Medications  Medication Sig Dispense Refill  . amoxicillin-clavulanate (AUGMENTIN) 875-125 MG tablet Take 1 tablet by mouth 2 (two) times daily. Take all of this medication 20 tablet 0  . aspirin 81 MG tablet Take 81 mg by mouth daily.    . cholecalciferol (VITAMIN D3) 25 MCG (1000 UNIT) tablet Take 1,000 Units by mouth daily.    . diphenhydrAMINE-zinc acetate (BENADRYL ITCH STOPPING) cream Apply topically 3 (three) times daily as needed for itching. 28.3 g 1  . Dulaglutide (TRULICITY) 1.5 ZO/1.0RU SOPN INJECT 1 SYRINGE SUBCUTANEOUSLY ONCE A WEEK 2 pen 0  . empagliflozin (JARDIANCE) 25 MG TABS tablet Take 25 mg by mouth daily. 90 tablet 3  . ferrous sulfate 325 (65 FE) MG tablet Take 1 tablet (325 mg total) by mouth at bedtime. 90 tablet 3  . insulin degludec (TRESIBA FLEXTOUCH) 100 UNIT/ML SOPN FlexTouch Pen INJECT 20 UNITS SUBCUTANEOUSLY AT BEDTIME 15 mL 0  . lisinopril (ZESTRIL) 20 MG tablet Take 1 tablet by mouth once daily 90 tablet 2  . loratadine (CLARITIN) 10 MG tablet Take 1 tablet (10 mg total) by mouth daily. 90 tablet 3  . metFORMIN (GLUCOPHAGE-XR) 500 MG 24 hr tablet TAKE 3 TABLETS BY MOUTH ONCE DAILY WITH BREAKFAST 270 tablet 1  . omeprazole (PRILOSEC) 20 MG capsule Take 1 capsule (20 mg total) by mouth daily. 90 capsule 2  . predniSONE (STERAPRED UNI-PAK 21 TAB) 10 MG (21) TBPK tablet 60 mg tablet by mouth day 1, 50 mg day 2, 40 mg day 3, 30  Mg day 4, 20 mg day 5, 10 mg day 6 1 each 0  . Pseudoephedrine-Guaifenesin [id number] MG TB12 Take 1 tablet by mouth 2 (two) times daily. For congestion 12 tablet 1  . rosuvastatin (CRESTOR) 20 MG tablet Take 1 tablet (20 mg total) by mouth at bedtime. 90 tablet 2   No current facility-administered medications for this visit.    REVIEW OF SYSTEMS:   Constitutional: ( - )  fevers, ( - )  chills , ( - ) night sweats Eyes: ( - ) blurriness of vision, ( - ) double vision, ( - ) watery eyes Ears, nose, mouth, throat, and face: ( - ) mucositis, ( - ) sore throat Respiratory: ( - ) cough, ( - ) dyspnea, ( - ) wheezes Cardiovascular: ( - ) palpitation, ( - ) chest discomfort, ( - ) lower extremity swelling Gastrointestinal:  ( - ) nausea, ( - )  heartburn, ( - ) change in bowel habits Skin: ( - ) abnormal skin rashes Lymphatics: ( - ) new lymphadenopathy, ( - ) easy bruising Neurological: ( - ) numbness, ( - ) tingling, ( - ) new weaknesses Behavioral/Psych: ( - ) mood change, ( - ) new changes  All other systems were reviewed with the patient and are negative.  PHYSICAL EXAMINATION: ECOG PERFORMANCE STATUS: 0 - Asymptomatic  Vitals:   08/28/19 0940  BP: 139/78  Pulse: 71  Resp: 18  Temp: 97.7 F (36.5 C)  SpO2: 99%   Filed Weights   08/28/19 0940  Weight: 219 lb 14.4 oz (99.7 kg)    GENERAL: well appearing elderly Caucasian male. alert, no distress and comfortable SKIN: skin color, texture, turgor are normal, no rashes or significant lesions EYES: conjunctiva are pink and non-injected, sclera clear LUNGS: clear to auscultation and percussion with normal breathing effort HEART: regular rate & rhythm and no murmurs and no lower extremity edema ABDOMEN: soft, non-tender, non-distended, normal bowel sounds Musculoskeletal: no cyanosis of digits and no clubbing  PSYCH: alert & oriented x 3, fluent speech NEURO: no focal motor/sensory deficits  LABORATORY DATA:  I have reviewed the data as listed CBC Latest Ref Rng & Units 08/28/2019 05/15/2019 04/03/2019  WBC 4.0 - 10.5 K/uL 7.4 7.8 7.1  Hemoglobin 13.0 - 17.0 g/dL 15.2 12.9(L) 12.4(L)  Hematocrit 39 - 52 % 47.0 43.9 40.3  Platelets 150 - 400 K/uL 139(L) 200 231    CMP Latest Ref Rng & Units 08/28/2019 05/15/2019 04/03/2019  Glucose 70 - 99 mg/dL 113(H) 115(H) 113(H)  BUN 8 - 23 mg/dL 24(H) 18 20    Creatinine 0.61 - 1.24 mg/dL 0.85 0.85 0.96  Sodium 135 - 145 mmol/L 143 140 140  Potassium 3.5 - 5.1 mmol/L 4.6 4.3 4.6  Chloride 98 - 111 mmol/L 107 108 103  CO2 22 - 32 mmol/L '27 25 22  '$ Calcium 8.9 - 10.3 mg/dL 9.2 9.0 9.2  Total Protein 6.5 - 8.1 g/dL 6.8 6.6 6.8  Total Bilirubin 0.3 - 1.2 mg/dL 1.9(H) 0.9 1.1  Alkaline Phos 38 - 126 U/L 51 51 50  AST 15 - 41 U/L '19 16 21  '$ ALT 0 - 44 U/L '22 16 18    '$ RADIOGRAPHIC STUDIES: No results found.  ASSESSMENT & PLAN Jason Rogers 64 y.o. male with medical history significant for iron deficiency anemia 2/2 to chronic blood donation who presents for a follow up visit.  On review of the patient's labs today it is apparent that his hemoglobin levels have increased up to the normal range.  We are still waiting on the results of his iron panel to return, but it can be safely assumed that we are adequately repeating his iron with p.o. therapy.  Therefore I do not think there is any indication for IV iron at this time.  As for the source of the bleeding it is most likely due to his frequent blood donations and he became anemic as a result of his generosity.  The patient is eager to start doing in a again but I am reluctant to have him start back given that this made him iron deficient.  Furthermore in the labs the patient was found to have a mild thrombocytopenia and a mild elevation in bilirubin.  I am hoping that this is only a transient abnormality but this could potentially be secondary to liver damage from his diabetes medications.  I would like to have him  return in 4 weeks time for repeat check of his labs to assure that these were transient nature and do not escalate.  In the event that he is found to have worsening LFTs or platelets we would need to discuss these findings with his endocrinologist.  We will plan to have the patient return for a clinic visit in 3 months time in order to discuss iron treatment moving forward and whether or not  donation will be appropriate.  #Iron Deficiency Anemia, likely 2/2 to frequent blood donations. --today will order CBC, CMP, retic panel, and iron profile --findings are most consistent with iron deficiency anemia 2/2 to chronic blood loss via q2 month blood donations. I have requested that the patient stop providing blood donations until his iron levels are replete.  --no findings that are suspicious for a GI bleed. Will make a referral to GI for consideration of colonoscopy/EGD in the event his levels do not improve with PO therapy and stopping blood donations.  --continue iron sulfate '325mg'$  PO daily, to be taken with a source of vitamin C --no clear indication for IV iron or bone marrow biopsy at this time  --RTC in 3 months to re-evaluate and assure iron stores care completely replete  #Elevated LFTs #Thrombocytopenia --etiology is unclear, but I am concerned he has some mild liver damage due to his DM type II medications. --recommend return for bloodwork in 4 weeks to determine if this was a transient issue or if there is another reason his bilirubin is elevated/platelets mildly decreased --continue to monitor.   No orders of the defined types were placed in this encounter.  All questions were answered. The patient knows to call the clinic with any problems, questions or concerns.  A total of more than 30 minutes were spent on this encounter and over half of that time was spent on counseling and coordination of care as outlined above.   Ledell Peoples, MD Department of Hematology/Oncology [place] [facility] Phone: [phone] Pager: [phone] Email: [email or web address]  08/28/2019 2:04 PM

## 2019-09-01 ENCOUNTER — Telehealth: Payer: Self-pay | Admitting: Hematology and Oncology

## 2019-09-01 NOTE — Telephone Encounter (Signed)
Scheduled per los. Called and left msg. Mailed printout  °

## 2019-09-25 ENCOUNTER — Other Ambulatory Visit: Payer: Self-pay | Admitting: Hematology and Oncology

## 2019-09-25 ENCOUNTER — Inpatient Hospital Stay: Payer: Medicare HMO

## 2019-09-25 ENCOUNTER — Other Ambulatory Visit: Payer: Self-pay

## 2019-09-25 DIAGNOSIS — D509 Iron deficiency anemia, unspecified: Secondary | ICD-10-CM | POA: Diagnosis not present

## 2019-09-25 DIAGNOSIS — E785 Hyperlipidemia, unspecified: Secondary | ICD-10-CM | POA: Diagnosis not present

## 2019-09-25 DIAGNOSIS — Z79899 Other long term (current) drug therapy: Secondary | ICD-10-CM | POA: Diagnosis not present

## 2019-09-25 DIAGNOSIS — Z7952 Long term (current) use of systemic steroids: Secondary | ICD-10-CM | POA: Diagnosis not present

## 2019-09-25 DIAGNOSIS — Z7982 Long term (current) use of aspirin: Secondary | ICD-10-CM | POA: Diagnosis not present

## 2019-09-25 DIAGNOSIS — I10 Essential (primary) hypertension: Secondary | ICD-10-CM | POA: Diagnosis not present

## 2019-09-25 DIAGNOSIS — D696 Thrombocytopenia, unspecified: Secondary | ICD-10-CM

## 2019-09-25 DIAGNOSIS — R17 Unspecified jaundice: Secondary | ICD-10-CM

## 2019-09-25 DIAGNOSIS — E119 Type 2 diabetes mellitus without complications: Secondary | ICD-10-CM | POA: Diagnosis not present

## 2019-09-25 DIAGNOSIS — Z794 Long term (current) use of insulin: Secondary | ICD-10-CM | POA: Diagnosis not present

## 2019-09-25 LAB — SAVE SMEAR(SSMR), FOR PROVIDER SLIDE REVIEW

## 2019-09-25 LAB — CBC WITH DIFFERENTIAL (CANCER CENTER ONLY)
Abs Immature Granulocytes: 0.02 10*3/uL (ref 0.00–0.07)
Basophils Absolute: 0 10*3/uL (ref 0.0–0.1)
Basophils Relative: 1 %
Eosinophils Absolute: 0.1 10*3/uL (ref 0.0–0.5)
Eosinophils Relative: 2 %
HCT: 47.5 % (ref 39.0–52.0)
Hemoglobin: 15.6 g/dL (ref 13.0–17.0)
Immature Granulocytes: 0 %
Lymphocytes Relative: 26 %
Lymphs Abs: 1.9 10*3/uL (ref 0.7–4.0)
MCH: 28.2 pg (ref 26.0–34.0)
MCHC: 32.8 g/dL (ref 30.0–36.0)
MCV: 85.9 fL (ref 80.0–100.0)
Monocytes Absolute: 0.7 10*3/uL (ref 0.1–1.0)
Monocytes Relative: 10 %
Neutro Abs: 4.5 10*3/uL (ref 1.7–7.7)
Neutrophils Relative %: 61 %
Platelet Count: 185 10*3/uL (ref 150–400)
RBC: 5.53 MIL/uL (ref 4.22–5.81)
RDW: 15 % (ref 11.5–15.5)
WBC Count: 7.3 10*3/uL (ref 4.0–10.5)
nRBC: 0 % (ref 0.0–0.2)

## 2019-09-25 LAB — CMP (CANCER CENTER ONLY)
ALT: 18 U/L (ref 0–44)
AST: 17 U/L (ref 15–41)
Albumin: 4.1 g/dL (ref 3.5–5.0)
Alkaline Phosphatase: 47 U/L (ref 38–126)
Anion gap: 9 (ref 5–15)
BUN: 24 mg/dL — ABNORMAL HIGH (ref 8–23)
CO2: 24 mmol/L (ref 22–32)
Calcium: 10.1 mg/dL (ref 8.9–10.3)
Chloride: 107 mmol/L (ref 98–111)
Creatinine: 1.01 mg/dL (ref 0.61–1.24)
GFR, Est AFR Am: >60 mL/min (ref >60)
GFR, Estimated: >60 mL/min (ref >60)
Glucose, Bld: 176 mg/dL — ABNORMAL HIGH (ref 70–99)
Potassium: 4.3 mmol/L (ref 3.5–5.1)
Sodium: 140 mmol/L (ref 135–145)
Total Bilirubin: 2 mg/dL — ABNORMAL HIGH (ref 0.3–1.2)
Total Protein: 6.7 g/dL (ref 6.5–8.1)

## 2019-09-25 LAB — LACTATE DEHYDROGENASE: LDH: 161 U/L (ref 98–192)

## 2019-09-25 LAB — IMMATURE PLATELET FRACTION: Immature Platelet Fraction: 1.6 % (ref 1.2–8.6)

## 2019-09-28 ENCOUNTER — Telehealth: Payer: Self-pay | Admitting: *Deleted

## 2019-09-28 NOTE — Telephone Encounter (Signed)
-----   Message from Orson Slick, MD sent at 09/28/2019  9:33 AM EDT ----- Please let Jason Rogers know that his Bililrubin is still elevated at 2.0, but it is stable. The reason for this is not clear. Because this is a mild elevation we will continue to monitor.  ----- Message ----- From: Interface, Lab In [place] Sent: 09/25/2019  10:08 AM EDT To: Orson Slick, MD

## 2019-09-28 NOTE — Telephone Encounter (Signed)
TCT patient regarding lab results. No answer but was able to leave vm message for patient  To return call at his earliest convenience.

## 2019-09-29 ENCOUNTER — Telehealth: Payer: Self-pay

## 2019-09-29 NOTE — Telephone Encounter (Signed)
Received a return phone call about lab work results. Results given and understanding verbalized.

## 2019-10-02 ENCOUNTER — Ambulatory Visit (INDEPENDENT_AMBULATORY_CARE_PROVIDER_SITE_OTHER): Payer: Medicare HMO | Admitting: Family

## 2019-10-02 ENCOUNTER — Encounter: Payer: Self-pay | Admitting: Family

## 2019-10-02 ENCOUNTER — Other Ambulatory Visit: Payer: Self-pay

## 2019-10-02 VITALS — BP 131/80 | HR 67 | Temp 97.0°F | Ht 72.0 in | Wt 218.4 lb

## 2019-10-02 DIAGNOSIS — R6889 Other general symptoms and signs: Secondary | ICD-10-CM | POA: Diagnosis not present

## 2019-10-02 DIAGNOSIS — E611 Iron deficiency: Secondary | ICD-10-CM

## 2019-10-02 DIAGNOSIS — I10 Essential (primary) hypertension: Secondary | ICD-10-CM | POA: Diagnosis not present

## 2019-10-02 DIAGNOSIS — M7072 Other bursitis of hip, left hip: Secondary | ICD-10-CM

## 2019-10-02 DIAGNOSIS — E1159 Type 2 diabetes mellitus with other circulatory complications: Secondary | ICD-10-CM | POA: Diagnosis not present

## 2019-10-02 DIAGNOSIS — E1169 Type 2 diabetes mellitus with other specified complication: Secondary | ICD-10-CM

## 2019-10-02 DIAGNOSIS — E1165 Type 2 diabetes mellitus with hyperglycemia: Secondary | ICD-10-CM

## 2019-10-02 DIAGNOSIS — E785 Hyperlipidemia, unspecified: Secondary | ICD-10-CM | POA: Diagnosis not present

## 2019-10-02 DIAGNOSIS — I152 Hypertension secondary to endocrine disorders: Secondary | ICD-10-CM

## 2019-10-02 DIAGNOSIS — K219 Gastro-esophageal reflux disease without esophagitis: Secondary | ICD-10-CM | POA: Diagnosis not present

## 2019-10-02 DIAGNOSIS — R5383 Other fatigue: Secondary | ICD-10-CM

## 2019-10-02 DIAGNOSIS — E663 Overweight: Secondary | ICD-10-CM | POA: Diagnosis not present

## 2019-10-02 DIAGNOSIS — Z23 Encounter for immunization: Secondary | ICD-10-CM

## 2019-10-02 LAB — BAYER DCA HB A1C WAIVED: HB A1C (BAYER DCA - WAIVED): 7.3 % — ABNORMAL HIGH (ref <7.0)

## 2019-10-02 MED ORDER — MELOXICAM 15 MG PO TABS: 15.0000 mg | ORAL_TABLET | Freq: Every day | ORAL | 0 refills | Status: DC

## 2019-10-02 NOTE — Progress Notes (Signed)
Subjective:    Patient ID: Jason Rogers, male    DOB: January 13, 1956, 64 y.o.   MRN: [id number]  Chief Complaint  Patient presents with  . Medical Management of Chronic Issues    6 mth   . Hypertension  . Diabetes   PT presents to the office today for chronic follow up.He is followed by Hematologists every 3 months for iron deficiency. He is followed by Endocrinologists for every 4 months for DM.   He is complaining of fatigue that started over the last year, that improved once he started his iron. However, over the last 2 months he has had increased fatigue. He is unsure if this is related to the heat.   Hypertension This is a chronic problem. The current episode started more than 1 year ago. The problem has been resolved since onset. The problem is controlled. Associated symptoms include malaise/fatigue. Pertinent negatives include no peripheral edema or shortness of breath. Risk factors for coronary artery disease include dyslipidemia, diabetes mellitus, obesity, male gender and sedentary lifestyle. The current treatment provides moderate improvement. There is no history of CVA or heart failure.  Diabetes He presents for his follow-up diabetic visit. He has type 2 diabetes mellitus. His disease course has been stable. There are no hypoglycemic associated symptoms. Associated symptoms include visual change. Symptoms are stable. Pertinent negatives for diabetic complications include no CVA, heart disease, nephropathy or peripheral neuropathy. Risk factors for coronary artery disease include dyslipidemia, diabetes mellitus, hypertension, male sex and sedentary lifestyle. He is following a generally unhealthy diet. His overall blood glucose range is 110-130 mg/dl. He sees a podiatrist.Eye exam is not current.  Hyperlipidemia This is a chronic problem. The current episode started more than 1 year ago. The problem is controlled. Recent lipid tests were reviewed and are normal. Exacerbating  diseases include obesity. Pertinent negatives include no shortness of breath. Current antihyperlipidemic treatment includes statins. The current treatment provides moderate improvement of lipids.  Anemia Presents for follow-up visit. Symptoms include light-headedness and malaise/fatigue. There is no history of heart failure.  Hip Pain  The incident occurred more than 1 week ago. There was no injury mechanism. The pain is present in the left hip. The pain is at a severity of 9/10. The pain is moderate. The pain has been intermittent since onset. Associated symptoms include muscle weakness. Pertinent negatives include no numbness or tingling. He reports no foreign bodies present. The symptoms are aggravated by movement and weight bearing. He has tried rest and NSAIDs for the symptoms. The treatment provided mild relief.      Review of Systems  Constitutional: Positive for malaise/fatigue.  Respiratory: Negative for shortness of breath.   Neurological: Positive for light-headedness. Negative for tingling and numbness.  All other systems reviewed and are negative.      Objective:   Physical Exam Vitals reviewed.  Constitutional:      General: He is not in acute distress.    Appearance: He is well-developed.  HENT:     Head: Normocephalic.     Right Ear: Tympanic membrane normal.     Left Ear: Tympanic membrane normal.  Eyes:     General:        Right eye: No discharge.        Left eye: No discharge.     Pupils: Pupils are equal, round, and reactive to light.  Neck:     Thyroid: No thyromegaly.  Cardiovascular:     Rate and Rhythm: Normal rate and  regular rhythm.     Heart sounds: Normal heart sounds. No murmur heard.   Pulmonary:     Effort: Pulmonary effort is normal. No respiratory distress.     Breath sounds: Normal breath sounds. No wheezing.  Abdominal:     General: Bowel sounds are normal. There is no distension.     Palpations: Abdomen is soft.     Tenderness: There is  no abdominal tenderness.  Musculoskeletal:        General: Tenderness (pain on lateral left hip with palpaiton ) present. Normal range of motion.     Cervical back: Normal range of motion and neck supple.  Skin:    General: Skin is warm and dry.     Findings: No erythema or rash.  Neurological:     Mental Status: He is alert and oriented to person, place, and time.     Cranial Nerves: No cranial nerve deficit.     Deep Tendon Reflexes: Reflexes are normal and symmetric.  Psychiatric:        Behavior: Behavior normal.        Thought Content: Thought content normal.        Judgment: Judgment normal.       BP 131/80   Pulse 67   Temp (!) 97 F (36.1 C) (Temporal)   Ht 6' (1.829 m)   Wt 218 lb 6.4 oz (99.1 kg)   SpO2 99%   BMI 29.62 kg/m      Assessment & Plan:  Jason Rogers comes in today with chief complaint of Medical Management of Chronic Issues (6 mth ), Hypertension, and Diabetes   Diagnosis and orders addressed:  1. Type 2 diabetes mellitus with hyperglycemia, without long-term current use of insulin (HCC) - Bayer DCA Hb A1c Waived - BMP8+EGFR - Hepatic function panel  2. Hypertension associated with diabetes ([place]) - BMP8+EGFR - Hepatic function panel  3. Gastroesophageal reflux disease, unspecified whether esophagitis present - BMP8+EGFR - Hepatic function panel  4. Hyperlipidemia associated with type 2 diabetes mellitus (HCC) - BMP8+EGFR - Hepatic function panel  5. Overweight (BMI 25.0-29.9) - BMP8+EGFR - Hepatic function panel  6. Iron deficiency  - BMP8+EGFR - Hepatic function panel  7. Bursitis of left hip, unspecified bursa No other NSIAD's - BMP8+EGFR - Hepatic function panel - meloxicam (MOBIC) 15 MG tablet; Take 1 tablet (15 mg total) by mouth daily.  Dispense: 30 tablet; Refill: 0  8. Fatigue, unspecified type - BMP8+EGFR - Hepatic function panel - TSH - Vitamin B12   Labs pending Health Maintenance reviewed Diet and  exercise encouraged  Follow up plan: 6 months    Jason Dun, FNP

## 2019-10-02 NOTE — Patient Instructions (Addendum)
Hip Bursitis  Hip bursitis is inflammation of a fluid-filled sac (bursa) in the hip joint. The bursa prevents the bones in the hip joint from rubbing against each other. Hip bursitis can cause mild to moderate pain, and symptoms often come and go over time. What are the causes? This condition may be caused by:  Injury to the hip.  Overuse of the muscles that surround the hip joint.  Previous injury or surgery of the hip.  Arthritis or gout.  Diabetes.  Thyroid disease.  Infection. In some cases, the cause may not be known. What are the signs or symptoms? Symptoms of this condition include:  Mild or moderate pain in the hip area. Pain may get worse with movement.  Tenderness and swelling of the hip, especially on the outer side of the hip.  In rare cases, the bursa may become infected. This may cause a fever, as well as warmth and redness in the area. Symptoms may come and go. How is this diagnosed? This condition may be diagnosed based on:  A physical exam.  Your medical history.  X-rays.  Removal of fluid from your inflamed bursa for testing (biopsy). You may be sent to a health care provider who specializes in bone diseases (orthopedist) or a provider who specializes in joint inflammation (rheumatologist). How is this treated? This condition is treated by resting, icing, applying pressure (compression), and raising (elevating) the injured area. This is called RICE treatment. In some cases, this may be enough to make your symptoms go away. Treatment may also include:  Using crutches.  Draining fluid out of the bursa to help relieve swelling.  Injecting medicine that helps to reduce inflammation (cortisone).  Additional medicines if the bursa is infected. Follow these instructions at home: Managing pain, stiffness, and swelling   If directed, put ice on the painful area. ? Put ice in a plastic bag. ? Place a towel between your skin and the bag. ? Leave the ice  on for 20 minutes, 2-3 times a day. ? Raise (elevate) your hip above the level of your heart as much as you can without pain. To do this, try putting a pillow under your hips while you lie down. Activity  Return to your normal activities as told by your health care provider. Ask your health care provider what activities are safe for you.  Rest and protect your hip as much as possible until your pain and swelling get better. General instructions  Take over-the-counter and prescription medicines only as told by your health care provider.  Wear compression wraps only as told by your health care provider.  Do not use your hip to support your body weight until your health care provider says that you can. Use crutches as told by your health care provider.  Gently massage and stretch your injured area as often as is comfortable.  Keep all follow-up visits as told by your health care provider. This is important. How is this prevented?  Exercise regularly, as told by your health care provider.  Warm up and stretch before being active.  Cool down and stretch after being active.  If an activity irritates your hip or causes pain, avoid the activity as much as possible.  Avoid sitting down for long periods at a time. Contact a health care provider if you:  Have a fever.  Develop new symptoms.  Have difficulty walking or doing everyday activities.  Have pain that gets worse or does not get better with medicine.  Develop red skin or a feeling of warmth in your hip area. Get help right away if you:  Cannot move your hip.  Have severe pain. Summary  Hip bursitis is inflammation of a fluid-filled sac (bursa) in the hip joint.  Hip bursitis can cause mild to moderate pain, and symptoms often come and go over time.  This condition is treated with rest, ice, compression, elevation, and medicines. This information is not intended to replace advice given to you by your health care  provider. Make sure you discuss any questions you have with your health care provider. Document Revised: 10/21/2017 Document Reviewed: 10/21/2017 Elsevier Patient Education  2020 [place] versicolor is a common fungal infection of the skin. It causes a rash that appears as light or dark patches on the skin. The rash most often occurs on the chest, back, neck, or upper arms. This condition is more common during warm weather. Other than affecting how your skin looks, tinea versicolor usually does not cause other problems. In most cases, the infection goes away in a few weeks with treatment. It may take a few months for the patches on your skin to return to your usual skin color. What are the causes? This condition occurs when a type of fungus that is normally present on the skin starts to overgrow. This fungus is a kind of yeast. The exact cause of the overgrowth is not known. This condition cannot be passed from one person to another (it is not contagious). What increases the risk? This condition is more likely to develop when certain factors are present, such as:  Heat and humidity.  Sweating too much.  Hormone changes.  Oily skin.  A weak disease-fighting system (immunesystem). What are the signs or symptoms? Symptoms of this condition include:  A rash of light or dark patches on your skin. The rash may have: ? Patches of tan or pink spots (on light skin). ? Patches of white or brown spots (on dark skin). ? Patches of skin that do not tan. ? Well-marked edges. ? Scales on the discolored areas.  Mild itching. How is this diagnosed? A health care provider can usually diagnose this condition by looking at your skin. During the exam, he or she may use ultraviolet (UV) light to see how much of your skin has been affected. In some cases, a skin sample may be taken by scraping the rash. This sample will be viewed under a microscope to check for yeast  overgrowth. How is this treated? Treatment for this condition may include:  Dandruff shampoo that is applied to the affected skin during showers or bathing.  Over-the-counter medicated skin cream, lotion, or soaps.  Prescription antifungal medicine in the form of skin cream or pills.  Medicine to help reduce itching. Follow these instructions at home:  Take over-the-counter and prescription medicines only as told by your health care provider.  Apply dandruff shampoo to the affected area if your health care provider told you to do that. You may be instructed to scrub the affected skin for several minutes each day.  Do not scratch the affected area of skin.  Avoid hot and humid conditions.  Do not use tanning booths.  Try to avoid sweating a lot. Contact a health care provider if:  Your symptoms get worse.  You have a fever.  You have redness, swelling, or pain at the site of your rash.  You have fluid or blood coming from your rash.  Your rash feels warm to the touch.  You have pus or a bad smell coming from your rash.  Your rash returns (recurs) after treatment. Summary  Tinea versicolor is a common fungal infection of the skin. It causes a rash that appears as light or dark patches on the skin.  The rash most often occurs on the chest, back, neck, or upper arms.  A health care provider can usually diagnose this condition by looking at your skin.  Treatment may include applying shampoo to the skin and taking or applying medicines. This information is not intended to replace advice given to you by your health care provider. Make sure you discuss any questions you have with your health care provider. Document Revised: 01/25/2017 Document Reviewed: 10/16/2016 Elsevier Patient Education  2020 [employer].

## 2019-10-03 LAB — SPECIMEN STATUS

## 2019-10-05 ENCOUNTER — Other Ambulatory Visit: Payer: Self-pay | Admitting: Family

## 2019-10-05 DIAGNOSIS — H9193 Unspecified hearing loss, bilateral: Secondary | ICD-10-CM

## 2019-10-05 LAB — HEPATIC FUNCTION PANEL
ALT: 19 IU/L (ref 0–44)
AST: 18 IU/L (ref 0–40)
Albumin: 4.6 g/dL (ref 3.8–4.8)
Alkaline Phosphatase: 51 IU/L (ref 48–121)
Bilirubin Total: 1.8 mg/dL — ABNORMAL HIGH (ref 0.0–1.2)
Bilirubin, Direct: 0.23 mg/dL (ref 0.00–0.40)
Total Protein: 6.4 g/dL (ref 6.0–8.5)

## 2019-10-05 LAB — VITAMIN B12: Vitamin B-12: 578 pg/mL (ref 232–1245)

## 2019-10-05 LAB — BMP8+EGFR
BUN/Creatinine Ratio: 24 (ref 10–24)
BUN: 22 mg/dL (ref 8–27)
CO2: 22 mmol/L (ref 20–29)
Calcium: 9.3 mg/dL (ref 8.6–10.2)
Chloride: 104 mmol/L (ref 96–106)
Creatinine, Ser: 0.93 mg/dL (ref 0.76–1.27)
GFR calc Af Amer: 99 mL/min/{1.73_m2} (ref >59)
GFR calc non Af Amer: 86 mL/min/{1.73_m2} (ref >59)
Glucose: 112 mg/dL — ABNORMAL HIGH (ref 65–99)
Potassium: 4.6 mmol/L (ref 3.5–5.2)
Sodium: 141 mmol/L (ref 134–144)

## 2019-10-05 LAB — TSH: TSH: 2.4 u[IU]/mL (ref 0.450–4.500)

## 2019-10-07 ENCOUNTER — Telehealth: Payer: Self-pay | Admitting: Family

## 2019-10-18 ENCOUNTER — Other Ambulatory Visit: Payer: Self-pay | Admitting: Family

## 2019-10-18 DIAGNOSIS — M7072 Other bursitis of hip, left hip: Secondary | ICD-10-CM

## 2019-10-23 DIAGNOSIS — E785 Hyperlipidemia, unspecified: Secondary | ICD-10-CM | POA: Diagnosis not present

## 2019-10-23 DIAGNOSIS — E1169 Type 2 diabetes mellitus with other specified complication: Secondary | ICD-10-CM | POA: Diagnosis not present

## 2019-10-30 DIAGNOSIS — E1169 Type 2 diabetes mellitus with other specified complication: Secondary | ICD-10-CM | POA: Diagnosis not present

## 2019-10-30 DIAGNOSIS — E785 Hyperlipidemia, unspecified: Secondary | ICD-10-CM | POA: Diagnosis not present

## 2019-11-20 LAB — HM DIABETES EYE EXAM

## 2019-11-30 ENCOUNTER — Other Ambulatory Visit: Payer: Medicare HMO

## 2019-11-30 ENCOUNTER — Ambulatory Visit: Payer: Medicare HMO | Admitting: Hematology and Oncology

## 2019-12-04 ENCOUNTER — Inpatient Hospital Stay: Payer: Medicare HMO | Attending: Hematology and Oncology

## 2019-12-04 ENCOUNTER — Inpatient Hospital Stay (HOSPITAL_BASED_OUTPATIENT_CLINIC_OR_DEPARTMENT_OTHER): Payer: Medicare HMO | Admitting: Hematology and Oncology

## 2019-12-04 ENCOUNTER — Other Ambulatory Visit: Payer: Self-pay | Admitting: Hematology and Oncology

## 2019-12-04 ENCOUNTER — Other Ambulatory Visit: Payer: Self-pay

## 2019-12-04 VITALS — BP 168/82 | HR 86 | Temp 97.8°F | Resp 18 | Ht 72.0 in | Wt 221.2 lb

## 2019-12-04 DIAGNOSIS — D509 Iron deficiency anemia, unspecified: Secondary | ICD-10-CM | POA: Insufficient documentation

## 2019-12-04 DIAGNOSIS — Z79899 Other long term (current) drug therapy: Secondary | ICD-10-CM | POA: Insufficient documentation

## 2019-12-04 DIAGNOSIS — H903 Sensorineural hearing loss, bilateral: Secondary | ICD-10-CM | POA: Diagnosis not present

## 2019-12-04 DIAGNOSIS — Z794 Long term (current) use of insulin: Secondary | ICD-10-CM | POA: Diagnosis not present

## 2019-12-04 DIAGNOSIS — E119 Type 2 diabetes mellitus without complications: Secondary | ICD-10-CM | POA: Diagnosis not present

## 2019-12-04 DIAGNOSIS — H9313 Tinnitus, bilateral: Secondary | ICD-10-CM | POA: Diagnosis not present

## 2019-12-04 DIAGNOSIS — E785 Hyperlipidemia, unspecified: Secondary | ICD-10-CM | POA: Insufficient documentation

## 2019-12-04 DIAGNOSIS — D500 Iron deficiency anemia secondary to blood loss (chronic): Secondary | ICD-10-CM

## 2019-12-04 DIAGNOSIS — I10 Essential (primary) hypertension: Secondary | ICD-10-CM | POA: Insufficient documentation

## 2019-12-04 LAB — CBC WITH DIFFERENTIAL (CANCER CENTER ONLY)
Abs Immature Granulocytes: 0.02 10*3/uL (ref 0.00–0.07)
Basophils Absolute: 0.1 10*3/uL (ref 0.0–0.1)
Basophils Relative: 1 %
Eosinophils Absolute: 0.1 10*3/uL (ref 0.0–0.5)
Eosinophils Relative: 2 %
HCT: 47 % (ref 39.0–52.0)
Hemoglobin: 15.7 g/dL (ref 13.0–17.0)
Immature Granulocytes: 0 %
Lymphocytes Relative: 28 %
Lymphs Abs: 1.8 10*3/uL (ref 0.7–4.0)
MCH: 29.2 pg (ref 26.0–34.0)
MCHC: 33.4 g/dL (ref 30.0–36.0)
MCV: 87.4 fL (ref 80.0–100.0)
Monocytes Absolute: 0.6 10*3/uL (ref 0.1–1.0)
Monocytes Relative: 9 %
Neutro Abs: 3.8 10*3/uL (ref 1.7–7.7)
Neutrophils Relative %: 60 %
Platelet Count: 155 10*3/uL (ref 150–400)
RBC: 5.38 MIL/uL (ref 4.22–5.81)
RDW: 13.1 % (ref 11.5–15.5)
WBC Count: 6.4 10*3/uL (ref 4.0–10.5)
nRBC: 0 % (ref 0.0–0.2)

## 2019-12-04 LAB — CMP (CANCER CENTER ONLY)
ALT: 26 U/L (ref 0–44)
AST: 20 U/L (ref 15–41)
Albumin: 3.9 g/dL (ref 3.5–5.0)
Alkaline Phosphatase: 43 U/L (ref 38–126)
Anion gap: 7 (ref 5–15)
BUN: 20 mg/dL (ref 8–23)
CO2: 28 mmol/L (ref 22–32)
Calcium: 9.5 mg/dL (ref 8.9–10.3)
Chloride: 107 mmol/L (ref 98–111)
Creatinine: 0.82 mg/dL (ref 0.61–1.24)
GFR, Estimated: >60 mL/min (ref >60)
Glucose, Bld: 111 mg/dL — ABNORMAL HIGH (ref 70–99)
Potassium: 4.4 mmol/L (ref 3.5–5.1)
Sodium: 142 mmol/L (ref 135–145)
Total Bilirubin: 1.4 mg/dL — ABNORMAL HIGH (ref 0.3–1.2)
Total Protein: 6.7 g/dL (ref 6.5–8.1)

## 2019-12-04 LAB — RETIC PANEL
Immature Retic Fract: 8.8 % (ref 2.3–15.9)
RBC.: 5.43 MIL/uL (ref 4.22–5.81)
Retic Count, Absolute: 60.8 10*3/uL (ref 19.0–186.0)
Retic Ct Pct: 1.1 % (ref 0.4–3.1)
Reticulocyte Hemoglobin: 34.1 pg (ref >27.9)

## 2019-12-04 LAB — FERRITIN: Ferritin: 48 ng/mL (ref 24–336)

## 2019-12-04 LAB — IRON AND TIBC
Iron: 63 ug/dL (ref 42–163)
Saturation Ratios: 18 % — ABNORMAL LOW (ref 20–55)
TIBC: 346 ug/dL (ref 202–409)
UIBC: 283 ug/dL (ref 117–376)

## 2019-12-04 NOTE — Progress Notes (Signed)
[place] Telephone:([phone]) [phone]   Fax:([phone]) [phone]  PROGRESS NOTE  Patient Care Team: Sharion Balloon, FNP as PCP - General (Nurse Practitioner)  Hematological/Oncological History # Iron Deficiency Anemia 2/2 to Chronic Blood Donation 1) 07/26/2017: WBC 8.1, Hgb 13.0, MCV 74, Plt 216.  2) 01/31/2018: WBC 7.3, Hgb 12.2, MCV 69, Plt 256 3) 10/10/2018: WBC 6.7, Hgb 11.5, MCV 70, Plt 265 Iron 20, TIBC 395, ferritin 10, iron sat 5% 4) 04/03/2019: WBC 7.1, Hgb 12.4, MCV 75, Plt 231. Iron 23, TIBC 426, ferritin 9, Iron sat 5% 5) 05/15/2019: establish care with Dr. Lorenso Courier   Interval History:  Alyson Locket Markgraf 64 y.o. male with medical history significant for iron deficiency anemia 2/2 to chronic blood donation who presents for a follow up visit. The patient's last visit was on 08/28/2019 at which time he established care. In the interim since the last visit he has not donated any blood or had other medical issues.  On exam today Mr. Dehner notes that he has been fine since her last visit.  He reports he has had no changes in his health and has not noticed any overt signs of bleeding such as bruising, gum bleeding, or dark stools.  He notes he is not as tired as he was and he feels like he is not as irritable as he was previously.  He reports he has been drinking protein shakes in order to help boost his protein levels.  He also continues to deny any abdominal discomfort, nausea, vomiting, or diarrhea.  He has tolerated the iron pills well with no upset stomach.  A full 10 point ROS is listed below.  MEDICAL HISTORY:  Past Medical History:  Diagnosis Date  . Allergy   . Diabetes mellitus without complication ([place])   . Hyperlipidemia   . Hypertension     SURGICAL HISTORY: Past Surgical History:  Procedure Laterality Date  . HERNIA REPAIR    . TESTICLE SURGERY    . TONSILLECTOMY AND ADENOIDECTOMY    . VASECTOMY      SOCIAL HISTORY: Social History   Socioeconomic History   . Marital status: Married    Spouse name: Not on file  . Number of children: Not on file  . Years of education: Not on file  . Highest education level: Not on file  Occupational History  . Not on file  Tobacco Use  . Smoking status: Never Smoker  . Smokeless tobacco: Never Used  Vaping Use  . Vaping Use: Never used  Substance and Sexual Activity  . Alcohol use: Yes    Comment: rare  . Drug use: No  . Sexual activity: Not on file  Other Topics Concern  . Not on file  Social History Narrative  . Not on file   Social Determinants of Health   Financial Resource Strain:   . Difficulty of Paying Living Expenses: Not on file  Food Insecurity:   . Worried About Charity fundraiser in the Last Year: Not on file  . Ran Out of Food in the Last Year: Not on file  Transportation Needs:   . Lack of Transportation (Medical): Not on file  . Lack of Transportation (Non-Medical): Not on file  Physical Activity:   . Days of Exercise per Week: Not on file  . Minutes of Exercise per Session: Not on file  Stress:   . Feeling of Stress : Not on file  Social Connections:   . Frequency of Communication with  Friends and Family: Not on file  . Frequency of Social Gatherings with Friends and Family: Not on file  . Attends Religious Services: Not on file  . Active Member of Clubs or Organizations: Not on file  . Attends Archivist Meetings: Not on file  . Marital Status: Not on file  Intimate Partner Violence:   . Fear of Current or Ex-Partner: Not on file  . Emotionally Abused: Not on file  . Physically Abused: Not on file  . Sexually Abused: Not on file    FAMILY HISTORY: Family History  Problem Relation Age of Onset  . Diabetes Mother   . Heart disease Father     ALLERGIES:  is allergic to lipitor [atorvastatin].  MEDICATIONS:  Current Outpatient Medications  Medication Sig Dispense Refill  . Dulaglutide 3 MG/0.5ML SOPN Inject into the skin.    Marland Kitchen ascorbic acid  (VITAMIN C) 500 MG tablet Take by mouth.    Marland Kitchen aspirin 81 MG tablet Take 81 mg by mouth daily.    . cholecalciferol (VITAMIN D3) 25 MCG (1000 UNIT) tablet Take 1,000 Units by mouth daily.    . empagliflozin (JARDIANCE) 25 MG TABS tablet Take 25 mg by mouth daily. 90 tablet 3  . ferrous sulfate 325 (65 FE) MG tablet Take 1 tablet (325 mg total) by mouth at bedtime. 90 tablet 3  . Insulin Glargine (BASAGLAR KWIKPEN) 100 UNIT/ML Inject into the skin daily.    Marland Kitchen lisinopril (ZESTRIL) 20 MG tablet Take 1 tablet by mouth once daily 90 tablet 2  . loratadine (CLARITIN) 10 MG tablet Take 1 tablet (10 mg total) by mouth daily. 90 tablet 3  . meloxicam (MOBIC) 15 MG tablet Take 1 tablet by mouth once daily 30 tablet 1  . metFORMIN (GLUCOPHAGE-XR) 500 MG 24 hr tablet TAKE 3 TABLETS BY MOUTH ONCE DAILY WITH BREAKFAST 270 tablet 1  . Multiple Vitamin (MULTI-VITAMIN) tablet Take 1 tablet by mouth daily.    Marland Kitchen omeprazole (PRILOSEC) 20 MG capsule Take 1 capsule (20 mg total) by mouth daily. 90 capsule 2  . rosuvastatin (CRESTOR) 20 MG tablet Take 1 tablet (20 mg total) by mouth at bedtime. 90 tablet 2  . SHINGRIX injection      No current facility-administered medications for this visit.    REVIEW OF SYSTEMS:   Constitutional: ( - ) fevers, ( - )  chills , ( - ) night sweats Eyes: ( - ) blurriness of vision, ( - ) double vision, ( - ) watery eyes Ears, nose, mouth, throat, and face: ( - ) mucositis, ( - ) sore throat Respiratory: ( - ) cough, ( - ) dyspnea, ( - ) wheezes Cardiovascular: ( - ) palpitation, ( - ) chest discomfort, ( - ) lower extremity swelling Gastrointestinal:  ( - ) nausea, ( - ) heartburn, ( - ) change in bowel habits Skin: ( - ) abnormal skin rashes Lymphatics: ( - ) new lymphadenopathy, ( - ) easy bruising Neurological: ( - ) numbness, ( - ) tingling, ( - ) new weaknesses Behavioral/Psych: ( - ) mood change, ( - ) new changes  All other systems were reviewed with the patient and are  negative.  PHYSICAL EXAMINATION: ECOG PERFORMANCE STATUS: 0 - Asymptomatic  Vitals:   12/04/19 0956  BP: (!) 168/82  Pulse: 86  Resp: 18  Temp: 97.8 F (36.6 C)  SpO2: 100%   Filed Weights   12/04/19 0956  Weight: 221 lb 3.2 oz (100.3 kg)  GENERAL: well appearing elderly Caucasian male. alert, no distress and comfortable SKIN: skin color, texture, turgor are normal, no rashes or significant lesions EYES: conjunctiva are pink and non-injected, sclera clear LUNGS: clear to auscultation and percussion with normal breathing effort HEART: regular rate & rhythm and no murmurs and no lower extremity edema Musculoskeletal: no cyanosis of digits and no clubbing  PSYCH: alert & oriented x 3, fluent speech NEURO: no focal motor/sensory deficits  LABORATORY DATA:  I have reviewed the data as listed CBC Latest Ref Rng & Units 12/04/2019 10/02/2019 09/25/2019  WBC 4.0 - 10.5 K/uL 6.4 WILL FOLLOW 7.3  Hemoglobin 13.0 - 17.0 g/dL 15.7 WILL FOLLOW 15.6  Hematocrit 39 - 52 % 47.0 WILL FOLLOW 47.5  Platelets 150 - 400 K/uL 155 WILL FOLLOW 185    CMP Latest Ref Rng & Units 12/04/2019 10/02/2019 09/25/2019  Glucose 70 - 99 mg/dL 111(H) 112(H) 176(H)  BUN 8 - 23 mg/dL 20 22 24(H)  Creatinine 0.61 - 1.24 mg/dL 0.82 0.93 1.01  Sodium 135 - 145 mmol/L 142 141 140  Potassium 3.5 - 5.1 mmol/L 4.4 4.6 4.3  Chloride 98 - 111 mmol/L 107 104 107  CO2 22 - 32 mmol/L _0 Calcium 8.9 - 10.3 mg/dL 9.5 9.3 10.1  Total Protein 6.5 - 8.1 g/dL 6.7 6.4 6.7  Total Bilirubin 0.3 - 1.2 mg/dL 1.4(H) 1.8(H) 2.0(H)  Alkaline Phos 38 - 126 U/L 43 51 47  AST 15 - 41 U/L _1 ALT 0 - 44 U/L _2 RADIOGRAPHIC STUDIES: No results found.  ASSESSMENT & PLAN Kamauri Denardo Pfiffner 64 y.o. male with medical history significant for iron deficiency anemia 2/2 to chronic blood donation who presents for a follow up visit.  On review of the patient's labs today it is apparent that his hemoglobin levels have  increased up to the normal range.  We are still waiting on the results of his iron panel to return, but it can be safely assumed that we are adequately repeating his iron with p.o. therapy.  Therefore I do not think there is any indication for IV iron at this time.  As for the source of the bleeding it is most likely due to his frequent blood donations and he became anemic as a result of his generosity.  The patient is eager to start doing in a again but I am reluctant to have him start back given that this made him iron deficient.  The patient's iron stores are completely replete on today's labs I recommend that he could proceed with blood donations and continued p.o. iron therapy as long as he was donating.  If the patient was found to have continued iron deficiency anemia despite adequate p.o. supplementation I would recommend that he be evaluated for consideration of colonoscopy, though his last colonoscopy was performed in 2016.  Mr. Winchell voiced his understanding of the plan moving forward.  We will not to see him routinely in clinic as it appears his hemoglobin has normalized.   #Iron Deficiency Anemia, likely 2/2 to frequent blood donations. --today will order CBC, CMP, retic panel, and iron profile --findings are most consistent with iron deficiency anemia 2/2 to chronic blood loss via q2 month blood donations. I have requested that the patient stop providing blood donations until his iron levels are replete.  --no findings that are suspicious for a GI bleed. Will make a referral to GI for consideration of colonoscopy/EGD in  the event his levels do not improve with PO therapy and stopping blood donations.  --continue iron sulfate 324m PO daily, to be taken with a source of vitamin C --no clear indication for IV iron or bone marrow biopsy at this time  --RTC PRN if he develops anemia again.   #Elevated Bilirubin, improving #Thrombocytopenia, resolved --etiology is unclear, but I am concerned  he has some mild liver damage due to his DM type II medications. --continue to monitor.   No orders of the defined types were placed in this encounter.  All questions were answered. The patient knows to call the clinic with any problems, questions or concerns.  A total of more than 30 minutes were spent on this encounter and over half of that time was spent on counseling and coordination of care as outlined above.   JLedell Peoples MD Department of Hematology/Oncology [facility]Phone: [phone]: [phone]: [email or web address]  12/09/2019 8:02 PM

## 2019-12-09 ENCOUNTER — Encounter: Payer: Self-pay | Admitting: Hematology and Oncology

## 2019-12-19 ENCOUNTER — Other Ambulatory Visit: Payer: Self-pay | Admitting: Family

## 2019-12-19 DIAGNOSIS — M7072 Other bursitis of hip, left hip: Secondary | ICD-10-CM

## 2019-12-31 ENCOUNTER — Other Ambulatory Visit: Payer: Self-pay

## 2019-12-31 ENCOUNTER — Ambulatory Visit (INDEPENDENT_AMBULATORY_CARE_PROVIDER_SITE_OTHER): Payer: Medicare HMO

## 2019-12-31 DIAGNOSIS — Z23 Encounter for immunization: Secondary | ICD-10-CM | POA: Diagnosis not present

## 2020-01-24 ENCOUNTER — Other Ambulatory Visit: Payer: Self-pay | Admitting: Family

## 2020-01-24 DIAGNOSIS — M7072 Other bursitis of hip, left hip: Secondary | ICD-10-CM

## 2020-01-29 DIAGNOSIS — E785 Hyperlipidemia, unspecified: Secondary | ICD-10-CM | POA: Diagnosis not present

## 2020-01-29 DIAGNOSIS — E1169 Type 2 diabetes mellitus with other specified complication: Secondary | ICD-10-CM | POA: Diagnosis not present

## 2020-02-05 DIAGNOSIS — E785 Hyperlipidemia, unspecified: Secondary | ICD-10-CM | POA: Diagnosis not present

## 2020-02-05 DIAGNOSIS — E1169 Type 2 diabetes mellitus with other specified complication: Secondary | ICD-10-CM | POA: Diagnosis not present

## 2020-02-05 DIAGNOSIS — R17 Unspecified jaundice: Secondary | ICD-10-CM | POA: Diagnosis not present

## 2020-02-17 ENCOUNTER — Ambulatory Visit: Payer: Medicare HMO

## 2020-02-21 ENCOUNTER — Other Ambulatory Visit: Payer: Self-pay | Admitting: Family

## 2020-02-21 DIAGNOSIS — M7072 Other bursitis of hip, left hip: Secondary | ICD-10-CM

## 2020-02-21 DIAGNOSIS — K219 Gastro-esophageal reflux disease without esophagitis: Secondary | ICD-10-CM

## 2020-02-24 ENCOUNTER — Other Ambulatory Visit: Payer: Self-pay

## 2020-02-24 ENCOUNTER — Ambulatory Visit (INDEPENDENT_AMBULATORY_CARE_PROVIDER_SITE_OTHER): Payer: Medicare HMO

## 2020-02-24 DIAGNOSIS — Z23 Encounter for immunization: Secondary | ICD-10-CM

## 2020-02-24 NOTE — Progress Notes (Signed)
° °  Covid-19 Vaccination Clinic  Name:  LEWIE DEMAN    MRN: [id number] DOB: Sep 07, 1955  02/24/2020  Mr. Pell was observed post Covid-19 immunization for 15 minutes without incident. He was provided with Vaccine Information Sheet and instruction to access the V-Safe system.   Mr. Steil was instructed to call 911 with any severe reactions post vaccine:  Difficulty breathing   Swelling of face and throat   A fast heartbeat   A bad rash all over body   Dizziness and weakness   Immunizations Administered    Name Date Dose VIS Date Route   Moderna Covid-19 Booster Vaccine 02/24/2020  3:52 PM 0.25 mL 12/16/2019 Intramuscular   Manufacturer: Gala Murdoch   Lot: [id number]   NDC: 63785-885-02

## 2020-03-04 DIAGNOSIS — L814 Other melanin hyperpigmentation: Secondary | ICD-10-CM | POA: Diagnosis not present

## 2020-03-04 DIAGNOSIS — C44619 Basal cell carcinoma of skin of left upper limb, including shoulder: Secondary | ICD-10-CM | POA: Diagnosis not present

## 2020-03-04 DIAGNOSIS — L821 Other seborrheic keratosis: Secondary | ICD-10-CM | POA: Diagnosis not present

## 2020-03-04 DIAGNOSIS — D1801 Hemangioma of skin and subcutaneous tissue: Secondary | ICD-10-CM | POA: Diagnosis not present

## 2020-03-04 DIAGNOSIS — L80 Vitiligo: Secondary | ICD-10-CM | POA: Diagnosis not present

## 2020-03-18 DIAGNOSIS — C44619 Basal cell carcinoma of skin of left upper limb, including shoulder: Secondary | ICD-10-CM | POA: Diagnosis not present

## 2020-03-20 ENCOUNTER — Other Ambulatory Visit: Payer: Self-pay | Admitting: Family

## 2020-03-20 DIAGNOSIS — I152 Hypertension secondary to endocrine disorders: Secondary | ICD-10-CM

## 2020-03-20 DIAGNOSIS — E1159 Type 2 diabetes mellitus with other circulatory complications: Secondary | ICD-10-CM

## 2020-04-03 ENCOUNTER — Other Ambulatory Visit: Payer: Self-pay | Admitting: Family

## 2020-04-03 DIAGNOSIS — E1169 Type 2 diabetes mellitus with other specified complication: Secondary | ICD-10-CM

## 2020-04-03 DIAGNOSIS — E785 Hyperlipidemia, unspecified: Secondary | ICD-10-CM

## 2020-04-15 ENCOUNTER — Ambulatory Visit: Payer: Medicare HMO | Admitting: Family

## 2020-04-28 ENCOUNTER — Telehealth: Payer: Self-pay

## 2020-04-28 NOTE — Telephone Encounter (Signed)
Samples up front patient aware  

## 2020-05-01 ENCOUNTER — Other Ambulatory Visit: Payer: Self-pay | Admitting: Family

## 2020-05-01 DIAGNOSIS — M7072 Other bursitis of hip, left hip: Secondary | ICD-10-CM

## 2020-05-03 ENCOUNTER — Telehealth: Payer: Self-pay

## 2020-05-03 NOTE — Telephone Encounter (Signed)
Spouse called for we have ardiance samples. Please call back

## 2020-05-03 NOTE — Telephone Encounter (Signed)
Wife informed that we do not have any 25mg  of Jardiance.

## 2020-05-13 ENCOUNTER — Other Ambulatory Visit: Payer: Self-pay

## 2020-05-13 ENCOUNTER — Ambulatory Visit (INDEPENDENT_AMBULATORY_CARE_PROVIDER_SITE_OTHER): Payer: Medicare HMO | Admitting: Family

## 2020-05-13 ENCOUNTER — Encounter: Payer: Self-pay | Admitting: Family

## 2020-05-13 VITALS — BP 137/80 | HR 70 | Temp 97.3°F | Ht 72.0 in | Wt 223.6 lb

## 2020-05-13 DIAGNOSIS — E1165 Type 2 diabetes mellitus with hyperglycemia: Secondary | ICD-10-CM

## 2020-05-13 DIAGNOSIS — R6889 Other general symptoms and signs: Secondary | ICD-10-CM | POA: Diagnosis not present

## 2020-05-13 DIAGNOSIS — M7072 Other bursitis of hip, left hip: Secondary | ICD-10-CM

## 2020-05-13 DIAGNOSIS — E1169 Type 2 diabetes mellitus with other specified complication: Secondary | ICD-10-CM | POA: Diagnosis not present

## 2020-05-13 DIAGNOSIS — K219 Gastro-esophageal reflux disease without esophagitis: Secondary | ICD-10-CM | POA: Diagnosis not present

## 2020-05-13 DIAGNOSIS — Z23 Encounter for immunization: Secondary | ICD-10-CM

## 2020-05-13 DIAGNOSIS — I152 Hypertension secondary to endocrine disorders: Secondary | ICD-10-CM | POA: Diagnosis not present

## 2020-05-13 DIAGNOSIS — E611 Iron deficiency: Secondary | ICD-10-CM

## 2020-05-13 DIAGNOSIS — E1159 Type 2 diabetes mellitus with other circulatory complications: Secondary | ICD-10-CM | POA: Diagnosis not present

## 2020-05-13 DIAGNOSIS — E785 Hyperlipidemia, unspecified: Secondary | ICD-10-CM

## 2020-05-13 DIAGNOSIS — E663 Overweight: Secondary | ICD-10-CM | POA: Diagnosis not present

## 2020-05-13 LAB — BAYER DCA HB A1C WAIVED: HB A1C (BAYER DCA - WAIVED): 6.9 % (ref <7.0)

## 2020-05-13 MED ORDER — MELOXICAM 15 MG PO TABS: 15.0000 mg | ORAL_TABLET | Freq: Every day | ORAL | 4 refills | Status: DC

## 2020-05-13 MED ORDER — FERROUS SULFATE 325 (65 FE) MG PO TABS: 325.0000 mg | ORAL_TABLET | Freq: Every evening | ORAL | 3 refills | Status: DC

## 2020-05-13 NOTE — Addendum Note (Signed)
Addended by: Ladean Raya on: 05/13/2020 08:57 AM   Modules accepted: Orders

## 2020-05-13 NOTE — Patient Instructions (Signed)

## 2020-05-13 NOTE — Progress Notes (Signed)
Subjective:    Patient ID: Jason Rogers, male    DOB: 1956/02/13, 65 y.o.   MRN: [id number]  Chief Complaint  Patient presents with  . Diabetes  . Hypertension    PT presents to the office today for chronic follow up.He has seen Hematologists  for iron deficiency. He is followed by Endocrinologists for every 4 months for DM.  Diabetes He presents for his follow-up diabetic visit. He has type 2 diabetes mellitus. His disease course has been stable. There are no hypoglycemic associated symptoms. Pertinent negatives for diabetes include no blurred vision and no foot paresthesias. There are no hypoglycemic complications. Symptoms are stable. Pertinent negatives for diabetic complications include no CVA, heart disease or peripheral neuropathy. Risk factors for coronary artery disease include dyslipidemia, diabetes mellitus and male sex. He is following a generally healthy diet. His overall blood glucose range is 130-140 mg/dl. Eye exam is current.  Hypertension This is a chronic problem. The current episode started more than 1 year ago. The problem has been resolved since onset. The problem is controlled. Associated symptoms include malaise/fatigue. Pertinent negatives include no blurred vision, peripheral edema or shortness of breath. Risk factors for coronary artery disease include dyslipidemia, obesity, male gender and sedentary lifestyle. The current treatment provides moderate improvement. There is no history of CVA.  Gastroesophageal Reflux He complains of belching and heartburn. This is a chronic problem. The current episode started more than 1 year ago. The problem occurs occasionally. The symptoms are aggravated by medications. Risk factors include obesity. He has tried a PPI for the symptoms. The treatment provided moderate relief.  Anemia Presents for follow-up visit. Symptoms include malaise/fatigue.      Review of Systems  Constitutional: Positive for malaise/fatigue.  Eyes:  Negative for blurred vision.  Respiratory: Negative for shortness of breath.   Gastrointestinal: Positive for heartburn.  All other systems reviewed and are negative.      Objective:   Physical Exam Vitals reviewed.  Constitutional:      General: He is not in acute distress.    Appearance: He is well-developed.  HENT:     Head: Normocephalic.     Right Ear: Tympanic membrane normal.     Left Ear: Tympanic membrane normal.  Eyes:     General:        Right eye: No discharge.        Left eye: No discharge.     Pupils: Pupils are equal, round, and reactive to light.  Neck:     Thyroid: No thyromegaly.  Cardiovascular:     Rate and Rhythm: Normal rate and regular rhythm.     Heart sounds: Normal heart sounds. No murmur heard.   Pulmonary:     Effort: Pulmonary effort is normal. No respiratory distress.     Breath sounds: Normal breath sounds. No wheezing.  Abdominal:     General: Bowel sounds are normal. There is no distension.     Palpations: Abdomen is soft.     Tenderness: There is no abdominal tenderness.  Musculoskeletal:        General: No tenderness. Normal range of motion.     Cervical back: Normal range of motion and neck supple.  Skin:    General: Skin is warm and dry.     Findings: No erythema or rash.  Neurological:     Mental Status: He is alert and oriented to person, place, and time.     Cranial Nerves: No cranial nerve deficit.  Deep Tendon Reflexes: Reflexes are normal and symmetric.  Psychiatric:        Behavior: Behavior normal.        Thought Content: Thought content normal.        Judgment: Judgment normal.    Diabetic Foot Exam - Simple   Simple Foot Form Diabetic Foot exam was performed with the following findings: Yes 05/13/2020  8:46 AM  Visual Inspection No deformities, no ulcerations, no other skin breakdown bilaterally: Yes Sensation Testing Intact to touch and monofilament testing bilaterally: Yes Pulse Check Posterior Tibialis and  Dorsalis pulse intact bilaterally: Yes Comments      BP 137/80   Pulse 70   Temp (!) 97.3 F (36.3 C) (Temporal)   Ht 6' (1.829 m)   Wt 223 lb 9.6 oz (101.4 kg)   SpO2 98%   BMI 30.33 kg/m       Assessment & Plan:  Jason Rogers comes in today with chief complaint of Diabetes and Hypertension   Diagnosis and orders addressed:  1. Hypertension associated with diabetes ([place]) - CMP14+EGFR  2. Gastroesophageal reflux disease, unspecified whether esophagitis present - CMP14+EGFR  3. Hyperlipidemia associated with type 2 diabetes mellitus ([facility]) - CMP14+EGFR - Lipid panel  4. Type 2 diabetes mellitus with hyperglycemia, without long-term current use of insulin ([facility]) - CMP14+EGFR - Bayer DCA Hb A1c Waived - Microalbumin / creatinine urine ratio  5. Overweight (BMI 25.0-29.9) - CMP14+EGFR  6. Iron deficiency - Anemia Profile B - CMP14+EGFR - ferrous sulfate 325 (65 FE) MG tablet; Take 1 tablet (325 mg total) by mouth at bedtime.  Dispense: 90 tablet; Refill: 3  7. Bursitis of left hip, unspecified bursa - meloxicam (MOBIC) 15 MG tablet; Take 1 tablet (15 mg total) by mouth daily.  Dispense: 90 tablet; Refill: 4   Labs pending Health Maintenance reviewed Diet and exercise encouraged  Follow up plan: 6 months    Evelina Dun, FNP

## 2020-05-14 LAB — MICROALBUMIN / CREATININE URINE RATIO
Creatinine, Urine: 36.9 mg/dL
Microalb/Creat Ratio: <8 mg/g creat (ref 0–29)
Microalbumin, Urine: <3 ug/mL

## 2020-05-14 LAB — ANEMIA PROFILE B
Basophils Absolute: 0 10*3/uL (ref 0.0–0.2)
Basos: 1 %
EOS (ABSOLUTE): 0.1 10*3/uL (ref 0.0–0.4)
Eos: 2 %
Ferritin: 41 ng/mL (ref 30–400)
Folate: >20 ng/mL (ref >3.0)
Hematocrit: 46.9 % (ref 37.5–51.0)
Hemoglobin: 16 g/dL (ref 13.0–17.7)
Immature Grans (Abs): 0 10*3/uL (ref 0.0–0.1)
Immature Granulocytes: 0 %
Iron Saturation: 22 % (ref 15–55)
Iron: 73 ug/dL (ref 38–169)
Lymphocytes Absolute: 1.7 10*3/uL (ref 0.7–3.1)
Lymphs: 23 %
MCH: 29.3 pg (ref 26.6–33.0)
MCHC: 34.1 g/dL (ref 31.5–35.7)
MCV: 86 fL (ref 79–97)
Monocytes Absolute: 0.8 10*3/uL (ref 0.1–0.9)
Monocytes: 10 %
Neutrophils Absolute: 4.7 10*3/uL (ref 1.4–7.0)
Neutrophils: 64 %
Platelets: 185 10*3/uL (ref 150–450)
RBC: 5.46 x10E6/uL (ref 4.14–5.80)
RDW: 13.2 % (ref 11.6–15.4)
Retic Ct Pct: 1.4 % (ref 0.6–2.6)
Total Iron Binding Capacity: 339 ug/dL (ref 250–450)
UIBC: 266 ug/dL (ref 111–343)
Vitamin B-12: 568 pg/mL (ref 232–1245)
WBC: 7.4 10*3/uL (ref 3.4–10.8)

## 2020-05-14 LAB — CMP14+EGFR
ALT: 25 IU/L (ref 0–44)
AST: 25 IU/L (ref 0–40)
Albumin/Globulin Ratio: 2.3 — ABNORMAL HIGH (ref 1.2–2.2)
Albumin: 4.5 g/dL (ref 3.8–4.8)
Alkaline Phosphatase: 49 IU/L (ref 44–121)
BUN/Creatinine Ratio: 24 (ref 10–24)
BUN: 22 mg/dL (ref 8–27)
Bilirubin Total: 1.4 mg/dL — ABNORMAL HIGH (ref 0.0–1.2)
CO2: 24 mmol/L (ref 20–29)
Calcium: 9.2 mg/dL (ref 8.6–10.2)
Chloride: 100 mmol/L (ref 96–106)
Creatinine, Ser: 0.92 mg/dL (ref 0.76–1.27)
Globulin, Total: 2 g/dL (ref 1.5–4.5)
Glucose: 117 mg/dL — ABNORMAL HIGH (ref 65–99)
Potassium: 4.3 mmol/L (ref 3.5–5.2)
Sodium: 140 mmol/L (ref 134–144)
Total Protein: 6.5 g/dL (ref 6.0–8.5)
eGFR: 92 mL/min/{1.73_m2} (ref >59)

## 2020-05-14 LAB — LIPID PANEL
Chol/HDL Ratio: 1.9 ratio (ref 0.0–5.0)
Cholesterol, Total: 89 mg/dL — ABNORMAL LOW (ref 100–199)
HDL: 46 mg/dL (ref >39)
LDL Chol Calc (NIH): 26 mg/dL (ref 0–99)
Triglycerides: 83 mg/dL (ref 0–149)
VLDL Cholesterol Cal: 17 mg/dL (ref 5–40)

## 2020-05-29 ENCOUNTER — Other Ambulatory Visit: Payer: Self-pay | Admitting: Family

## 2020-05-29 DIAGNOSIS — K219 Gastro-esophageal reflux disease without esophagitis: Secondary | ICD-10-CM

## 2020-06-19 ENCOUNTER — Other Ambulatory Visit: Payer: Self-pay | Admitting: Family

## 2020-06-19 DIAGNOSIS — I152 Hypertension secondary to endocrine disorders: Secondary | ICD-10-CM

## 2020-06-19 DIAGNOSIS — E1159 Type 2 diabetes mellitus with other circulatory complications: Secondary | ICD-10-CM

## 2020-06-24 DIAGNOSIS — E1169 Type 2 diabetes mellitus with other specified complication: Secondary | ICD-10-CM | POA: Diagnosis not present

## 2020-06-24 DIAGNOSIS — E785 Hyperlipidemia, unspecified: Secondary | ICD-10-CM | POA: Diagnosis not present

## 2020-07-01 DIAGNOSIS — E1169 Type 2 diabetes mellitus with other specified complication: Secondary | ICD-10-CM | POA: Diagnosis not present

## 2020-07-01 DIAGNOSIS — E785 Hyperlipidemia, unspecified: Secondary | ICD-10-CM | POA: Diagnosis not present

## 2020-07-03 ENCOUNTER — Other Ambulatory Visit: Payer: Self-pay | Admitting: Family

## 2020-07-03 DIAGNOSIS — E1169 Type 2 diabetes mellitus with other specified complication: Secondary | ICD-10-CM

## 2020-07-03 DIAGNOSIS — E785 Hyperlipidemia, unspecified: Secondary | ICD-10-CM

## 2020-07-05 ENCOUNTER — Encounter: Payer: Self-pay | Admitting: Family Medicine

## 2020-07-20 ENCOUNTER — Encounter: Payer: Self-pay | Admitting: Family

## 2020-07-22 ENCOUNTER — Telehealth: Payer: Self-pay | Admitting: Family

## 2020-07-22 NOTE — Telephone Encounter (Signed)
Pt would like Jardiance samples if we have any, if not he would like to have some sent to the [facility]

## 2020-07-22 NOTE — Telephone Encounter (Signed)
Patient aware and verbalized understanding. °

## 2020-08-09 ENCOUNTER — Telehealth: Payer: Self-pay | Admitting: Pharmacist

## 2020-08-09 MED ORDER — DAPAGLIFLOZIN PROPANEDIOL 10 MG PO TABS: 10.0000 mg | ORAL_TABLET | Freq: Every day | ORAL | 3 refills | Status: DC

## 2020-08-09 NOTE — Telephone Encounter (Signed)
SWITCHED JARDIANCE TO FARXIGA DUE TO PATIENT ASSISTANCE REQUIREMENTS SUBMITT[place]PLICATION TO [place] and [place] patient assistance program for farxiga for 2022 Medication will ship to patient's home (IF APPROVED) RX escribed to [facility] with 3 refills

## 2020-09-06 ENCOUNTER — Telehealth: Payer: Self-pay

## 2020-09-06 NOTE — Telephone Encounter (Signed)
Wife and patient called stating that he has been having on and off chest pain like he pulled a muscle. Advise to go to ER and wife states that she has tried to get patient to go to the ER and he refuses since [place].  Wife aware patient needs to go but patient refused.  Requesting appointment Friday afternoon at 4 when they get back in town.  Appointment scheduled with Tiffany.  Patient and wife both verbalized understanding that patient needs to go to ER but he refuses.  FYI to provider seeing him on Friday.

## 2020-09-09 ENCOUNTER — Ambulatory Visit (INDEPENDENT_AMBULATORY_CARE_PROVIDER_SITE_OTHER): Payer: Medicare HMO

## 2020-09-09 ENCOUNTER — Other Ambulatory Visit: Payer: Self-pay

## 2020-09-09 ENCOUNTER — Ambulatory Visit (INDEPENDENT_AMBULATORY_CARE_PROVIDER_SITE_OTHER): Payer: Medicare HMO | Admitting: Family Medicine

## 2020-09-09 ENCOUNTER — Encounter: Payer: Self-pay | Admitting: Family Medicine

## 2020-09-09 VITALS — BP 133/84 | HR 88 | Temp 97.3°F | Ht 69.0 in | Wt 224.0 lb

## 2020-09-09 DIAGNOSIS — M79642 Pain in left hand: Secondary | ICD-10-CM

## 2020-09-09 DIAGNOSIS — S60552A Superficial foreign body of left hand, initial encounter: Secondary | ICD-10-CM

## 2020-09-09 DIAGNOSIS — M19042 Primary osteoarthritis, left hand: Secondary | ICD-10-CM | POA: Diagnosis not present

## 2020-09-09 DIAGNOSIS — R079 Chest pain, unspecified: Secondary | ICD-10-CM

## 2020-09-09 MED ORDER — PREDNISONE 20 MG PO TABS: 40.0000 mg | ORAL_TABLET | Freq: Every day | ORAL | 0 refills | Status: DC

## 2020-09-09 NOTE — Patient Instructions (Signed)
Angina  Angina is discomfort in the chest, neck, arm, jaw, or back. The discomfort is caused by a lack of blood in the middle layer of the heart wall (myocardium). There are four types of angina: Stable angina. This is triggered by vigorous activity or exercise. It goes away when you rest or take medicines that treat angina. This is diagnosed if you have had the symptom for more than 2 months. Unstable angina. This is a warning sign and can lead to a heart attack. This is a medical emergency. Symptoms come at rest and last a long time. Microvascular angina. This affects the small coronary arteries. Symptoms include chest pain, feeling tired, and being short of breath. The symptoms can last a long time or short time. Prinzmetal or variant angina. This is caused by a spasm of the arteries that go to your heart. What are the causes? This condition is usually caused by atherosclerosis. This is the buildup of fat and cholesterol (plaque) in your arteries. The plaque may narrow or block the artery. Other causes of angina include: Sudden spasms of the muscles of the arteries in the heart. Small artery disease (microvascular dysfunction). Problems with any of your heart valves. A tear in an artery in your heart (coronary artery dissection). Weakness of the heart muscle (cardiomyopathy). What increases the risk? You are more likely to develop this condition if you have: High cholesterol. High blood pressure. Diabetes. A family history of heart disease. A sedentary lifestyle, or a lifestyle in which you do not exercise enough. Depression. Had radiation treatment to the left side of your chest. Other risk factors include: Using tobacco. Being obese. Eating a diet high in saturated fats. Being exposed to high stress or triggers of stress. Using drugs, such as cocaine. Women have a greater risk for angina if they: Are older than age 64. Have gone through menopause. What are the signs or  symptoms? Common symptoms of this condition in both men and women may include: Chest pain, which may: Feel like a crushing or squeezing in the chest, or a tightness, pressure, fullness, or heaviness in the chest. Last for more than a few minutes, or stop and come back over a few minutes. Pain in the neck, arm, jaw, or back. Unexplained heartburn or indigestion. Shortness of breath. Nausea. Sudden cold sweats. Women and people with diabetes may have unusual (atypical) symptoms, such as: Fatigue. Unexplained feelings of nervousness or anxiety. Unexplained weakness. Dizziness or fainting. How is this diagnosed? This condition may be diagnosed based on: Your symptoms and medical history. Electrocardiogram (ECG) to measure the electrical activity in your heart. Blood tests. Stress test to look for signs of blockage when your heart is stressed. CT angiogram to examine your heart and the blood flow to it. Coronary angiogram to check for arterial blockage. Echocardiogram (ultrasound) to assess the strength of your heartbeat. How is this treated? Angina may be treated with: Medicines to: Prevent blood clots and heart attack. Relax blood vessels and improve blood flow to the heart. Reduce blood pressure, improve heart pumping, and relax blood vessels spasms. Reduce cholesterol and help treat atherosclerosis. A procedure to widen a narrowed or blocked coronary artery (angioplasty). A mesh tube (stent) may be placed in a coronary artery to keep it open. Surgery to allow blood to go around a blocked artery (coronary artery bypass surgery). Follow these instructions at home: Medicines Take over-the-counter and prescription medicines only as told by your health care provider. Do not take the following  medicines unless your health care provider approves: NSAIDs, such as ibuprofen or naproxen. Vitamin supplements that contain vitamin A, vitamin E, or both. Hormone replacement therapy that  contains estrogen with or without progestin. Eating and drinking  Eat a heart-healthy diet. This includes plenty of fresh fruits and vegetables, whole grains, low-fat (lean) protein, and low-fat dairy products. Follow instructions from your health care provider about eating or drinking restrictions.  Activity Follow an exercise program approved by your health care provider. Consider joining a cardiac rehabilitation program. Take a break when you feel fatigued. Plan rest periods in your daily activities. Lifestyle  Do not use any products that contain nicotine or tobacco. These products include cigarettes, chewing tobacco, and vaping devices, such as e-cigarettes. If you need help quitting, ask your health care provider. If your health care provider says you can drink alcohol: Limit how much you have to: 0-1 drink a day for women who are not pregnant. 0-2 drinks a day for men. Be aware of how much alcohol is in your drink. In the [place] one drink equals one 12 oz bottle of beer (355 mL), one 5 oz glass of wine (148 mL), or one 1 oz glass of hard liquor (44 mL).  General instructions Maintain a healthy weight. Learn to manage stress. Keep your vaccinations up to date. Get the flu (influenza) vaccine every year. Talk to your health care provider if you feel depressed. Take a depression screening test to see if you are at risk for depression. Work with your health care provider to manage other health conditions, such as hypertension or diabetes. Keep all follow-up visits. This is important. Get help right away if: You have pain in your chest, neck, arm, jaw, or back, and the pain: Lasts more than a few minutes. Is recurring. Is not relieved by taking medicines under the tongue (sublingual nitroglycerin). Increases in intensity or frequency. You have a lot of sweating without cause. You have unexplained: Heartburn or indigestion. Shortness of breath or difficulty breathing. Nausea or  vomiting. Fatigue. Feelings of nervousness or anxiety. Weakness. You have sudden light-headedness or dizziness. You faint. These symptoms may represent a serious problem that is an emergency. Do not wait to see if the symptoms will go away. Get medical help right away. Call your local emergency services (911 in the [place] Do not drive yourself to the hospital. Summary Angina is discomfort in the chest, neck, arm, jaw, or back that is caused by a lack of blood in the arteries of the heart wall. There are many symptoms of angina. They include chest pain, unexplained heartburn or indigestion, sudden cold sweats, and fatigue. Angina may be treated with lifestyle changes, medicines, or surgery. Symptoms of angina may represent an emergency. Get medical help right away. Call your local emergency services (911 in the [place] Do not drive yourself to the hospital. This information is not intended to replace advice given to you by your health care provider. Make sure you discuss any questions you have with your healthcare provider. Document Revised: 08/07/2019 Document Reviewed: 08/07/2019 Elsevier Patient Education  [place].

## 2020-09-09 NOTE — Progress Notes (Signed)
Acute Office Visit  Subjective:    Patient ID: Jason Rogers, male    DOB: Dec 02, 1955, 65 y.o.   MRN: [id number]  Chief Complaint  Patient presents with   Chest Pain    1 week off and on    Hand Pain    Left hand     HPI Patient is in today for chest pain on and off for 5 days. The pain is in the center of his chest. It feels like a tightness. It last for 5-10 minutes at a time. Nothing seems to make the pain better or worse. The pain is not reproducible. It is not worse with movement, deep breath, or cough. Denies fever, shortness of breath, diaphoresis, dizziness, edema, numbness, tingling, focal weakness, fatigue, nausea, vomiting, visual disturbances, orthopnea,, or pain that radiates to the left jaw, shoulder, or arm. Denies heart burn or abdominal pain.   He also reports pain in his left hand x 2 weeks. This started when he was putting a grandchild into a car seat. The pain was sudden. It is tender to the touch. Denies decreased ROM. He has not tried anything for the pain.   Past Medical History:  Diagnosis Date   Allergy    Diabetes mellitus without complication (HCC)    Hyperlipidemia    Hypertension     Past Surgical History:  Procedure Laterality Date   HERNIA REPAIR     TESTICLE SURGERY     TONSILLECTOMY AND ADENOIDECTOMY     VASECTOMY      Family History  Problem Relation Age of Onset   Diabetes Mother    Heart disease Father     Social History   Socioeconomic History   Marital status: Married    Spouse name: Not on file   Number of children: Not on file   Years of education: Not on file   Highest education level: Not on file  Occupational History   Not on file  Tobacco Use   Smoking status: Never   Smokeless tobacco: Never  Vaping Use   Vaping Use: Never used  Substance and Sexual Activity   Alcohol use: Yes    Comment: rare   Drug use: No   Sexual activity: Not on file  Other Topics Concern   Not on file  Social History Narrative   Not  on file   Social Determinants of Health   Financial Resource Strain: Not on file  Food Insecurity: Not on file  Transportation Needs: Not on file  Physical Activity: Not on file  Stress: Not on file  Social Connections: Not on file  Intimate Partner Violence: Not on file    Outpatient Medications Prior to Visit  Medication Sig Dispense Refill   ascorbic acid (VITAMIN C) 500 MG tablet Take by mouth.     aspirin 81 MG tablet Take 81 mg by mouth daily.     cholecalciferol (VITAMIN D3) 25 MCG (1000 UNIT) tablet Take 1,000 Units by mouth daily.     dapagliflozin propanediol (FARXIGA) 10 MG TABS tablet Take 1 tablet (10 mg total) by mouth daily before breakfast. 90 tablet 3   Dulaglutide 3 MG/0.5ML SOPN Inject into the skin.     ferrous sulfate 325 (65 FE) MG tablet Take 1 tablet (325 mg total) by mouth at bedtime. 90 tablet 3   Insulin Glargine (BASAGLAR KWIKPEN) 100 UNIT/ML Inject into the skin daily.     lisinopril (ZESTRIL) 20 MG tablet Take 1 tablet by mouth once  daily 90 tablet 1   loratadine (CLARITIN) 10 MG tablet Take 1 tablet (10 mg total) by mouth daily. 90 tablet 3   meloxicam (MOBIC) 15 MG tablet Take 1 tablet (15 mg total) by mouth daily. 90 tablet 4   metFORMIN (GLUCOPHAGE-XR) 500 MG 24 hr tablet TAKE 3 TABLETS BY MOUTH ONCE DAILY WITH BREAKFAST 270 tablet 1   Multiple Vitamin (MULTI-VITAMIN) tablet Take 1 tablet by mouth daily.     omeprazole (PRILOSEC) 20 MG capsule Take 1 capsule by mouth once daily 90 capsule 1   rosuvastatin (CRESTOR) 20 MG tablet TAKE 1 TABLET BY MOUTH AT BEDTIME 90 tablet 1   No facility-administered medications prior to visit.    Allergies  Allergen Reactions   Lipitor [Atorvastatin]     Depressed    Review of Systems As per HPI.     Objective:    Physical Exam Vitals and nursing note reviewed.  Constitutional:      General: He is not in acute distress.    Appearance: He is not ill-appearing, toxic-appearing or diaphoretic.  HENT:      Head: Normocephalic and atraumatic.  Eyes:     Extraocular Movements: Extraocular movements intact.     Pupils: Pupils are equal, round, and reactive to light.  Neck:     Vascular: No carotid bruit or JVD.  Cardiovascular:     Rate and Rhythm: Normal rate and regular rhythm. No extrasystoles are present.    Heart sounds: Normal heart sounds. No murmur heard.   No friction rub. No gallop. No S3 or S4 sounds.  Pulmonary:     Effort: Pulmonary effort is normal.     Breath sounds: Normal breath sounds.  Chest:     Chest wall: No mass, deformity, tenderness or edema.  Abdominal:     Palpations: Abdomen is soft.  Musculoskeletal:     Left hand: Tenderness (soft tissue between thumb and first finger. No swelling, wamth, or erythema.) present. No swelling or bony tenderness. Normal range of motion.     Right lower leg: No edema.     Left lower leg: No edema.  Skin:    General: Skin is warm and dry.  Neurological:     General: No focal deficit present.     Mental Status: He is alert and oriented to person, place, and time.  Psychiatric:        Behavior: Behavior normal.    BP 133/84   Pulse 88   Temp (!) 97.3 F (36.3 C) (Temporal)   Ht _0  (1.753 m)   Wt 224 lb (101.6 kg)   SpO2 95%   BMI 33.08 kg/m  Wt Readings from Last 3 Encounters:  09/09/20 224 lb (101.6 kg)  05/13/20 223 lb 9.6 oz (101.4 kg)  12/04/19 221 lb 3.2 oz (100.3 kg)    Health Maintenance Due  Topic Date Due   Zoster Vaccines- Shingrix (2 of 2) 12/05/2018   COVID-19 Vaccine (4 - Booster for Moderna series) 06/24/2020    There are no preventive care reminders to display for this patient.   Lab Results  Component Value Date   TSH 2.400 10/02/2019   Lab Results  Component Value Date   WBC 7.4 05/13/2020   HGB 16.0 05/13/2020   HCT 46.9 05/13/2020   MCV 86 05/13/2020   PLT 185 05/13/2020   Lab Results  Component Value Date   NA 140 05/13/2020   K 4.3 05/13/2020   CO2 24 05/13/2020  GLUCOSE  117 (H) 05/13/2020   BUN 22 05/13/2020   CREATININE 0.92 05/13/2020   BILITOT 1.4 (H) 05/13/2020   ALKPHOS 49 05/13/2020   AST 25 05/13/2020   ALT 25 05/13/2020   PROT 6.5 05/13/2020   ALBUMIN 4.5 05/13/2020   CALCIUM 9.2 05/13/2020   ANIONGAP 7 12/04/2019   EGFR 92 05/13/2020   Lab Results  Component Value Date   CHOL 89 (L) 05/13/2020   Lab Results  Component Value Date   HDL 46 05/13/2020   Lab Results  Component Value Date   LDLCALC 26 05/13/2020   Lab Results  Component Value Date   TRIG 83 05/13/2020   Lab Results  Component Value Date   CHOLHDL 1.9 05/13/2020   Lab Results  Component Value Date   HGBA1C 6.9 05/13/2020       Assessment & Plan:   Pastor was seen today for chest pain and hand pain.  Diagnoses and all orders for this visit:  Chest pain, unspecified type EKG normal today. Labs pending. Referral to cardiology for further work up. Discussed return precautions and when to seek emergency care.  -     EKG 12-Lead -     CBC with Differential/Platelet -     CMP14+EGFR -     Ambulatory referral to Cardiology  Left hand pain Foreign body noted on xray today. No signs of infection. Referral to hand surgery.  -     DG Hand Complete Left; Future  Foreign body of left hand, initial encounter -     Ambulatory referral to Hand Surgery  Return to office for new or worsening symptoms, or if symptoms persist.   The patient indicates understanding of these issues and agrees with the plan.  Gwenlyn Perking, FNP

## 2020-09-10 LAB — CMP14+EGFR
ALT: 18 IU/L (ref 0–44)
AST: 17 IU/L (ref 0–40)
Albumin/Globulin Ratio: 2.1 (ref 1.2–2.2)
Albumin: 4.4 g/dL (ref 3.8–4.8)
Alkaline Phosphatase: 46 IU/L (ref 44–121)
BUN/Creatinine Ratio: 17 (ref 10–24)
BUN: 16 mg/dL (ref 8–27)
Bilirubin Total: 1.1 mg/dL (ref 0.0–1.2)
CO2: 21 mmol/L (ref 20–29)
Calcium: 9.1 mg/dL (ref 8.6–10.2)
Chloride: 107 mmol/L — ABNORMAL HIGH (ref 96–106)
Creatinine, Ser: 0.95 mg/dL (ref 0.76–1.27)
Globulin, Total: 2.1 g/dL (ref 1.5–4.5)
Glucose: 138 mg/dL — ABNORMAL HIGH (ref 65–99)
Potassium: 4 mmol/L (ref 3.5–5.2)
Sodium: 145 mmol/L — ABNORMAL HIGH (ref 134–144)
Total Protein: 6.5 g/dL (ref 6.0–8.5)
eGFR: 88 mL/min/{1.73_m2} (ref >59)

## 2020-09-10 LAB — CBC WITH DIFFERENTIAL/PLATELET
Basophils Absolute: 0 10*3/uL (ref 0.0–0.2)
Basos: 0 %
EOS (ABSOLUTE): 0.1 10*3/uL (ref 0.0–0.4)
Eos: 1 %
Hematocrit: 44.8 % (ref 37.5–51.0)
Hemoglobin: 15 g/dL (ref 13.0–17.7)
Immature Grans (Abs): 0 10*3/uL (ref 0.0–0.1)
Immature Granulocytes: 0 %
Lymphocytes Absolute: 1.6 10*3/uL (ref 0.7–3.1)
Lymphs: 20 %
MCH: 29 pg (ref 26.6–33.0)
MCHC: 33.5 g/dL (ref 31.5–35.7)
MCV: 87 fL (ref 79–97)
Monocytes Absolute: 0.7 10*3/uL (ref 0.1–0.9)
Monocytes: 9 %
Neutrophils Absolute: 5.4 10*3/uL (ref 1.4–7.0)
Neutrophils: 70 %
Platelets: 165 10*3/uL (ref 150–450)
RBC: 5.17 x10E6/uL (ref 4.14–5.80)
RDW: 13.2 % (ref 11.6–15.4)
WBC: 7.9 10*3/uL (ref 3.4–10.8)

## 2020-09-19 ENCOUNTER — Ambulatory Visit: Payer: Medicare HMO

## 2020-09-19 DIAGNOSIS — M1812 Unilateral primary osteoarthritis of first carpometacarpal joint, left hand: Secondary | ICD-10-CM | POA: Diagnosis not present

## 2020-09-20 NOTE — Progress Notes (Signed)
CARDIOLOGY CONSULT NOTE       Patient ID: Jason Rogers MRN: [id number] DOB/AGE: [birth year] 65 y.o.  Admit date: (Not on file) Referring Physician: FNP Marjorie Smolder Primary Physician: Sharion Balloon, FNP Primary Cardiologist: New Reason for Consultation: Chest Pain  Active Problems:   * No active hospital problems. *   HPI:  65 y.o. referred by FNP Marjorie Smolder for chest pain. History of DM-2, HLD and HTN. 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  [employer] with 4 kids in [place] and one stepson in [place] All other systems reviewed and negative except as noted above  Past Medical History:  Diagnosis Date   Allergy    Diabetes mellitus without complication ([facility])    Hyperlipidemia    Hypertension     Family History  Problem Relation Age of Onset   Diabetes Mother    Heart disease Father     Social History   Socioeconomic History   Marital status: Married    Spouse name: Not on file   Number of children: Not on file   Years of education: Not on file   Highest education level: Not on file  Occupational History   Not on file  Tobacco Use   Smoking status: Never   Smokeless tobacco: Never  Vaping Use   Vaping Use: Never used  Substance and Sexual Activity   Alcohol use: Yes    Comment: rare   Drug use: No   Sexual activity:  Not on file  Other Topics Concern   Not on file  Social History Narrative   Not on file   Social Determinants of Health   Financial Resource Strain: Not on file  Food Insecurity: Not on file  Transportation Needs: Not on file  Physical Activity: Not on file  Stress: Not on file  Social Connections: Not on file  Intimate Partner Violence: Not on file    Past Surgical History:  Procedure Laterality Date   HERNIA REPAIR     TESTICLE SURGERY     TONSILLECTOMY AND ADENOIDECTOMY     VASECTOMY        Current Outpatient Medications:    ascorbic acid (VITAMIN C) 500 MG tablet, Take by mouth., Disp: , Rfl:    aspirin 81 MG tablet, Take 81 mg by mouth daily., Disp: , Rfl:    cholecalciferol (VITAMIN D3) 25 MCG (1000 UNIT) tablet, Take 1,000 Units by mouth daily., Disp: , Rfl:    dapagliflozin propanediol (FARXIGA) 10 MG TABS tablet, Take 1 tablet (10 mg total) by mouth daily before breakfast., Disp: 90 tablet, Rfl: 3   Dulaglutide 3 MG/0.5ML SOPN, Inject into the skin., Disp: , Rfl:    ferrous sulfate 325 (65 FE) MG tablet, Take 1 tablet (325 mg total) by mouth at bedtime.,  Disp: 90 tablet, Rfl: 3   Insulin Glargine (BASAGLAR KWIKPEN) 100 UNIT/ML, Inject into the skin daily., Disp: , Rfl:    lisinopril (ZESTRIL) 20 MG tablet, Take 1 tablet by mouth once daily, Disp: 90 tablet, Rfl: 1   loratadine (CLARITIN) 10 MG tablet, Take 1 tablet (10 mg total) by mouth daily., Disp: 90 tablet, Rfl: 3   meloxicam (MOBIC) 15 MG tablet, Take 1 tablet (15 mg total) by mouth daily., Disp: 90 tablet, Rfl: 4   metFORMIN (GLUCOPHAGE-XR) 500 MG 24 hr tablet, TAKE 3 TABLETS BY MOUTH ONCE DAILY WITH BREAKFAST, Disp: 270 tablet, Rfl: 1   Multiple Vitamin (MULTI-VITAMIN) tablet, Take 1 tablet by mouth daily., Disp: , Rfl:    omeprazole (PRILOSEC) 20 MG capsule, Take 1 capsule by mouth once daily, Disp: 90 capsule, Rfl: 1   rosuvastatin (CRESTOR) 20 MG tablet, TAKE 1 TABLET BY MOUTH AT BEDTIME, Disp: 90 tablet,  Rfl: 1    Physical Exam: Blood pressure 140/80, pulse 72, height 6' (1.829 m), weight 105.7 kg, SpO2 98 %.    Affect appropriate Healthy:  appears stated age 65: normal Neck supple with no adenopathy JVP normal no bruits no thyromegaly Lungs clear with no wheezing and good diaphragmatic motion Heart:  S1/S2 no murmur, no rub, gallop or click PMI normal Abdomen: benighn, BS positve, no tenderness, no AAA no bruit.  No HSM or HJR Distal pulses intact with no bruits No edema Neuro non-focal Some persistent soft tissue swelling left hand in splint  No muscular weakness   Labs:   Lab Results  Component Value Date   WBC 7.9 09/09/2020   HGB 15.0 09/09/2020   HCT 44.8 09/09/2020   MCV 87 09/09/2020   PLT 165 09/09/2020   No results for input(s): NA, K, CL, CO2, BUN, CREATININE, CALCIUM, PROT, BILITOT, ALKPHOS, ALT, AST, GLUCOSE in the last 168 hours.  Invalid input(s): LABALBU Lab Results  Component Value Date   TROPONINI <0.03 03/30/2017    Lab Results  Component Value Date   CHOL 89 (L) 05/13/2020   CHOL 143 10/10/2018   CHOL 167 06/13/2018   Lab Results  Component Value Date   HDL 46 05/13/2020   HDL 37 (L) 10/10/2018   HDL 36 (L) 06/13/2018   Lab Results  Component Value Date   LDLCALC 26 05/13/2020   LDLCALC 61 10/10/2018   LDLCALC 73 06/13/2018   Lab Results  Component Value Date   TRIG 83 05/13/2020   TRIG 224 (H) 10/10/2018   TRIG 290 (H) 06/13/2018   Lab Results  Component Value Date   CHOLHDL 1.9 05/13/2020   CHOLHDL 3.9 10/10/2018   CHOLHDL 4.6 06/13/2018   No results found for: LDLDIRECT    Radiology: DG Hand Complete Left  Result Date: 09/12/2020 CLINICAL DATA:  Left hand pain for 3 weeks EXAM: LEFT HAND - COMPLETE 3+ VIEW COMPARISON:  None. FINDINGS: Degenerative changes are scattered throughout the interphalangeal joints, most marked in the third finger. No acute fractures or dislocations. A foreign body is seen in the soft tissues  a between the first and second metatarsals. No other acute abnormalities. IMPRESSION: 1. Degenerative changes scattered throughout the fingers, most marked in the third finger. 2. Foreign body in the soft tissues between the distal aspects of the first and second metatarsals. Electronically Signed   By: Dorise Bullion III M.D   On: 09/12/2020 14:19    EKG: SR rate 89 normal    ASSESSMENT AND PLAN:  Chest pain: CRF;s DM, HTN, HLD pain a bit atypical and normal ECG Shared decision making cardiac CTA with calcium score ordered  HTN:  Well controlled.  Continue current medications and low sodium Dash type diet.   HLD:  on crestor LDL 26 DM:  Discussed low carb diet.  Target hemoglobin A1c is 6.5 or less.  Continue current medications.A1c elevated at 7.3     Cardiac CTA BMET Lopressor 100 mg 2 hours before scan   F/U PRN if normal   Signed: Jenkins Rouge 09/21/2020, 3:07 PM

## 2020-09-21 ENCOUNTER — Ambulatory Visit: Payer: Medicare HMO | Admitting: Cardiovascular Disease

## 2020-09-21 ENCOUNTER — Other Ambulatory Visit (HOSPITAL_COMMUNITY)
Admission: RE | Admit: 2020-09-21 | Discharge: 2020-09-21 | Disposition: A | Discharge status: Home / Self Care | Payer: Medicare HMO | Source: Ambulatory Visit | Attending: Cardiovascular Disease | Admitting: Cardiovascular Disease

## 2020-09-21 ENCOUNTER — Encounter: Payer: Self-pay | Admitting: Cardiovascular Disease

## 2020-09-21 ENCOUNTER — Other Ambulatory Visit: Payer: Self-pay

## 2020-09-21 VITALS — BP 140/80 | HR 72 | Ht 72.0 in | Wt 233.0 lb

## 2020-09-21 DIAGNOSIS — Z79899 Other long term (current) drug therapy: Secondary | ICD-10-CM

## 2020-09-21 DIAGNOSIS — I10 Essential (primary) hypertension: Secondary | ICD-10-CM | POA: Diagnosis not present

## 2020-09-21 DIAGNOSIS — R079 Chest pain, unspecified: Secondary | ICD-10-CM

## 2020-09-21 DIAGNOSIS — E782 Mixed hyperlipidemia: Secondary | ICD-10-CM

## 2020-09-21 LAB — BASIC METABOLIC PANEL
Anion gap: 6 (ref 5–15)
BUN: 35 mg/dL — ABNORMAL HIGH (ref 8–23)
CO2: 23 mmol/L (ref 22–32)
Calcium: 9 mg/dL (ref 8.9–10.3)
Chloride: 106 mmol/L (ref 98–111)
Creatinine, Ser: 0.94 mg/dL (ref 0.61–1.24)
GFR, Estimated: >60 mL/min (ref >60)
Glucose, Bld: 175 mg/dL — ABNORMAL HIGH (ref 70–99)
Potassium: 4.3 mmol/L (ref 3.5–5.1)
Sodium: 135 mmol/L (ref 135–145)

## 2020-09-21 MED ORDER — METOPROLOL TARTRATE 100 MG PO TABS: 100.0000 mg | ORAL_TABLET | ORAL | 0 refills | Status: DC

## 2020-09-21 NOTE — Patient Instructions (Signed)
Medication Instructions:   Take Lopressor 100 mg 2 hours prior to CT Scan   *If you need a refill on your cardiac medications before your next appointment, please call your pharmacy*   Lab Work: Your physician recommends that you return for lab work in: Today Artist)   If you have labs (blood work) drawn today and your tests are completely normal, you will receive your results only by: MyChart Message (if you have MyChart) OR A paper copy in the mail If you have any lab test that is abnormal or we need to change your treatment, we will call you to review the results.   Testing/Procedures: Cardiac CTA    Follow-Up: At [facility], you and your health needs are our priority.  As part of our continuing mission to provide you with exceptional heart care, we have created designated Provider Care Teams.  These Care Teams include your primary Cardiologist (physician) and Advanced Practice Providers (APPs -  Physician Assistants and Nurse Practitioners) who all work together to provide you with the care you need, when you need it.  We recommend signing up for the patient portal called "MyChart".  Sign up information is provided on this After Visit Summary.  MyChart is used to connect with patients for Virtual Visits (Telemedicine).  Patients are able to view lab/test results, encounter notes, upcoming appointments, etc.  Non-urgent messages can be sent to your provider as well.   To learn more about what you can do with MyChart, go to [email or web address].    Your next appointment:    As Needed   The format for your next appointment:   In Person  Provider:   Jenkins Rouge, MD   Other Instructions Thank you for choosing [facility]!    Your cardiac CT will be scheduled at one of the below locations:   [facility] [address] [place], [place] [address] [phone]  [facility] [address] [place], [place] [address] [phone]  If scheduled at [facility], please arrive at the [facility] main entrance (entrance A) of [facility] 30 minutes prior to test start time. Proceed to the [facility] Radiology Department (first floor) to check-in and test prep.  If scheduled at [facility], please arrive 15 mins early for check-in and test prep.  Please follow these instructions carefully (unless otherwise directed):  Hold all erectile dysfunction medications at least 3 days (72 hrs) prior to test.  On the Night Before the Test: Be sure to Drink plenty of water. Do not consume any caffeinated/decaffeinated beverages or chocolate 12 hours prior to your test. Do not take any antihistamines 12 hours prior to your test.  On the Day of the Test: Drink plenty of water until 1 hour prior to the test. Do not eat any food 4 hours prior to the test. You may take your regular medications prior to the test.  Take metoprolol (Lopressor) two hours prior to test. HOLD Furosemide/Hydrochlorothiazide morning of the test.  *For Clinical Staff only. Please instruct patient the following:* Heart Rate Medication Recommendations for Cardiac CT  Resting HR < 50 bpm  No medication  Resting HR 50-60 bpm and BP >110/50 mmHG   Consider Metoprolol tartrate 25 mg PO 90-120 min prior to scan  Resting HR 60-65 bpm and BP >110/50 mmHG  Metoprolol tartrate 50 mg PO 90-120 minutes prior to scan   Resting HR > 65  bpm and BP >110/50 mmHG  Metoprolol tartrate 100 mg PO 90-120 minutes prior to scan  Consider Ivabradine 10-15 mg PO or a calcium channel blocker for resting HR >60 bpm and contraindication to metoprolol tartrate  Consider Ivabradine 10-15 mg PO in combination with metoprolol tartrate for HR >80 bpm         After the Test: Drink plenty of water. After receiving IV contrast, you may experience a mild flushed feeling. This is normal. On occasion, you may  experience a mild rash up to 24 hours after the test. This is not dangerous. If this occurs, you can take Benadryl 25 mg and increase your fluid intake. If you experience trouble breathing, this can be serious. If it is severe call 911 IMMEDIATELY. If it is mild, please call our office. If you take any of these medications: Glipizide/Metformin, Avandament, Glucavance, please do not take 48 hours after completing test unless otherwise instructed.  Please allow 2-4 weeks for scheduling of routine cardiac CTs. Some insurance companies require a pre-authorization which may delay scheduling of this test.   For non-scheduling related questions, please contact the cardiac imaging nurse navigator should you have any questions/concerns: Marchia Bond, Cardiac Imaging Nurse Navigator Gordy Clement, Cardiac Imaging Nurse Navigator [facility] Direct Office Dial: [phone]   For scheduling needs, including cancellations and rescheduling, please call [place], [phone].

## 2020-09-23 ENCOUNTER — Ambulatory Visit (INDEPENDENT_AMBULATORY_CARE_PROVIDER_SITE_OTHER): Payer: Medicare HMO

## 2020-09-23 VITALS — Ht 69.0 in | Wt 224.0 lb

## 2020-09-23 DIAGNOSIS — Z0000 Encounter for general adult medical examination without abnormal findings: Secondary | ICD-10-CM | POA: Diagnosis not present

## 2020-09-23 NOTE — Patient Instructions (Signed)
Jason Rogers , Thank you for taking time to come for your Medicare Wellness Visit. I appreciate your ongoing commitment to your health goals. Please review the following plan we discussed and let me know if I can assist you in the future.   Screening recommendations/referrals: Colonoscopy: Done 12/31/2014 - Repeat in 10 years  Recommended yearly ophthalmology/optometry visit for glaucoma screening and checkup Recommended yearly dental visit for hygiene and checkup  Vaccinations: Influenza vaccine: Done 12/31/2019 - Repeat annually  Pneumococcal vaccine: Done 04/03/2019 & 05/13/2020 Tdap vaccine: Done 10/28/2011 - Repeat in 10 years  Shingles vaccine: Done 10/10/2018 & 10/02/2019   Covid-19: Done 05/01/19, 06/02/19, & 02/24/2020  Advanced directives: Advance directive discussed with you today. Even though you declined this today, please call our office should you change your mind, and we can give you the proper paperwork for you to fill out.   Conditions/risks identified: Aim for 30 minutes of exercise or brisk walking each day, drink 6-8 glasses of water and eat lots of fruits and vegetables.   Next appointment: Follow up in one year for your annual wellness visit.   Preventive Care 74 Years and Older, Male  Preventive care refers to lifestyle choices and visits with your health care provider that can promote health and wellness. What does preventive care include? A yearly physical exam. This is also called an annual well check. Dental exams once or twice a year. Routine eye exams. Ask your health care provider how often you should have your eyes checked. Personal lifestyle choices, including: Daily care of your teeth and gums. Regular physical activity. Eating a healthy diet. Avoiding tobacco and drug use. Limiting alcohol use. Practicing safe sex. Taking low doses of aspirin every day. Taking vitamin and mineral supplements as recommended by your health care provider. What happens during an  annual well check? The services and screenings done by your health care provider during your annual well check will depend on your age, overall health, lifestyle risk factors, and family history of disease. Counseling  Your health care provider may ask you questions about your: Alcohol use. Tobacco use. Drug use. Emotional well-being. Home and relationship well-being. Sexual activity. Eating habits. History of falls. Memory and ability to understand (cognition). Work and work Statistician. Screening  You may have the following tests or measurements: Height, weight, and BMI. Blood pressure. Lipid and cholesterol levels. These may be checked every 5 years, or more frequently if you are over 48 years old. Skin check. Lung cancer screening. You may have this screening every year starting at age 63 if you have a 30-pack-year history of smoking and currently smoke or have quit within the past 15 years. Fecal occult blood test (FOBT) of the stool. You may have this test every year starting at age 60. Flexible sigmoidoscopy or colonoscopy. You may have a sigmoidoscopy every 5 years or a colonoscopy every 10 years starting at age 73. Prostate cancer screening. Recommendations will vary depending on your family history and other risks. Hepatitis C blood test. Hepatitis B blood test. Sexually transmitted disease (STD) testing. Diabetes screening. This is done by checking your blood sugar (glucose) after you have not eaten for a while (fasting). You may have this done every 1-3 years. Abdominal aortic aneurysm (AAA) screening. You may need this if you are a current or former smoker. Osteoporosis. You may be screened starting at age 17 if you are at high risk. Talk with your health care provider about your test results, treatment options, and  if necessary, the need for more tests. Vaccines  Your health care provider may recommend certain vaccines, such as: Influenza vaccine. This is recommended  every year. Tetanus, diphtheria, and acellular pertussis (Tdap, Td) vaccine. You may need a Td booster every 10 years. Zoster vaccine. You may need this after age 52. Pneumococcal 13-valent conjugate (PCV13) vaccine. One dose is recommended after age 42. Pneumococcal polysaccharide (PPSV23) vaccine. One dose is recommended after age 90. Talk to your health care provider about which screenings and vaccines you need and how often you need them. This information is not intended to replace advice given to you by your health care provider. Make sure you discuss any questions you have with your health care provider. Document Released: 03/11/2015 Document Revised: 11/02/2015 Document Reviewed: 12/14/2014 Elsevier Interactive Patient Education  2017 [place] Prevention in the Home Falls can cause injuries. They can happen to people of all ages. There are many things you can do to make your home safe and to help prevent falls. What can I do on the outside of my home? Regularly fix the edges of walkways and driveways and fix any cracks. Remove anything that might make you trip as you walk through a door, such as a raised step or threshold. Trim any bushes or trees on the path to your home. Use bright outdoor lighting. Clear any walking paths of anything that might make someone trip, such as rocks or tools. Regularly check to see if handrails are loose or broken. Make sure that both sides of any steps have handrails. Any raised decks and porches should have guardrails on the edges. Have any leaves, snow, or ice cleared regularly. Use sand or salt on walking paths during winter. Clean up any spills in your garage right away. This includes oil or grease spills. What can I do in the bathroom? Use night lights. Install grab bars by the toilet and in the tub and shower. Do not use towel bars as grab bars. Use non-skid mats or decals in the tub or shower. If you need to sit down in the shower,  use a plastic, non-slip stool. Keep the floor dry. Clean up any water that spills on the floor as soon as it happens. Remove soap buildup in the tub or shower regularly. Attach bath mats securely with double-sided non-slip rug tape. Do not have throw rugs and other things on the floor that can make you trip. What can I do in the bedroom? Use night lights. Make sure that you have a light by your bed that is easy to reach. Do not use any sheets or blankets that are too big for your bed. They should not hang down onto the floor. Have a firm chair that has side arms. You can use this for support while you get dressed. Do not have throw rugs and other things on the floor that can make you trip. What can I do in the kitchen? Clean up any spills right away. Avoid walking on wet floors. Keep items that you use a lot in easy-to-reach places. If you need to reach something above you, use a strong step stool that has a grab bar. Keep electrical cords out of the way. Do not use floor polish or wax that makes floors slippery. If you must use wax, use non-skid floor wax. Do not have throw rugs and other things on the floor that can make you trip. What can I do with my stairs? Do not leave any  items on the stairs. Make sure that there are handrails on both sides of the stairs and use them. Fix handrails that are broken or loose. Make sure that handrails are as long as the stairways. Check any carpeting to make sure that it is firmly attached to the stairs. Fix any carpet that is loose or worn. Avoid having throw rugs at the top or bottom of the stairs. If you do have throw rugs, attach them to the floor with carpet tape. Make sure that you have a light switch at the top of the stairs and the bottom of the stairs. If you do not have them, ask someone to add them for you. What else can I do to help prevent falls? Wear shoes that: Do not have high heels. Have rubber bottoms. Are comfortable and fit you  well. Are closed at the toe. Do not wear sandals. If you use a stepladder: Make sure that it is fully opened. Do not climb a closed stepladder. Make sure that both sides of the stepladder are locked into place. Ask someone to hold it for you, if possible. Clearly mark and make sure that you can see: Any grab bars or handrails. First and last steps. Where the edge of each step is. Use tools that help you move around (mobility aids) if they are needed. These include: Canes. Walkers. Scooters. Crutches. Turn on the lights when you go into a dark area. Replace any light bulbs as soon as they burn out. Set up your furniture so you have a clear path. Avoid moving your furniture around. If any of your floors are uneven, fix them. If there are any pets around you, be aware of where they are. Review your medicines with your doctor. Some medicines can make you feel dizzy. This can increase your chance of falling. Ask your doctor what other things that you can do to help prevent falls. This information is not intended to replace advice given to you by your health care provider. Make sure you discuss any questions you have with your health care provider. Document Released: 12/09/2008 Document Revised: 07/21/2015 Document Reviewed: 03/19/2014 Elsevier Interactive Patient Education  2017 [employer].

## 2020-09-23 NOTE — Progress Notes (Signed)
Subjective:   Jason Rogers is a 65 y.o. male who presents for an Initial Medicare Annual Wellness Visit.  Virtual Visit via Telephone Note  I connected with  Jason Rogers on 09/23/20 at  2:45 PM EDT by telephone and verified that I am speaking with the correct person using two identifiers.  Location: Patient: Home Provider: [facility] Persons participating in the virtual visit: patient/Nurse Health Advisor   I discussed the limitations, risks, security and privacy concerns of performing an evaluation and management service by telephone and the availability of in person appointments. The patient expressed understanding and agreed to proceed.  Interactive audio and video telecommunications were attempted between this nurse and patient, however failed, due to patient having technical difficulties OR patient did not have access to video capability.  We continued and completed visit with audio only.  Some vital signs may be absent or patient reported.   Jason Rogers E Jason Schneider, LPN   Review of Systems     Cardiac Risk Factors include: advanced age (>44mn, >>10women);male gender;obesity (BMI >30kg/m2);diabetes mellitus;hypertension     Objective:    Today's Vitals   09/23/20 1446  Weight: 224 lb (101.6 kg)  Height: '5\' 9"'$  (1.753 m)   Body mass index is 33.08 kg/m.  Advanced Directives 09/23/2020 05/15/2019 03/29/2017  Does Patient Have a Medical Advance Directive? No Yes No  Type of Advance Directive - [place] -  [place] of [place]? - No - copy requested -  Would patient like information on creating a medical advance directive? No - Patient declined - -    Current Medications (verified) Outpatient Encounter Medications as of 09/23/2020  Medication Sig   ascorbic acid (VITAMIN C) 500 MG tablet Take by mouth.   aspirin 81 MG tablet Take 81 mg by mouth daily.   cholecalciferol (VITAMIN D3) 25 MCG (1000 UNIT) tablet Take 1,000  Units by mouth daily.   dapagliflozin propanediol (FARXIGA) 10 MG TABS tablet Take 1 tablet (10 mg total) by mouth daily before breakfast.   Dulaglutide 3 MG/0.5ML SOPN Inject into the skin.   ferrous sulfate 325 (65 FE) MG tablet Take 1 tablet (325 mg total) by mouth at bedtime.   Insulin Glargine (BASAGLAR KWIKPEN) 100 UNIT/ML Inject into the skin daily.   lisinopril (ZESTRIL) 20 MG tablet Take 1 tablet by mouth once daily   loratadine (CLARITIN) 10 MG tablet Take 1 tablet (10 mg total) by mouth daily.   meloxicam (MOBIC) 15 MG tablet Take 1 tablet (15 mg total) by mouth daily.   metFORMIN (GLUCOPHAGE-XR) 500 MG 24 hr tablet TAKE 3 TABLETS BY MOUTH ONCE DAILY WITH BREAKFAST   metoprolol tartrate (LOPRESSOR) 100 MG tablet Take 1 tablet (100 mg total) by mouth as directed. Take 2 Hours prior to CT Scan   Multiple Vitamin (MULTI-VITAMIN) tablet Take 1 tablet by mouth daily.   omeprazole (PRILOSEC) 20 MG capsule Take 1 capsule by mouth once daily   rosuvastatin (CRESTOR) 20 MG tablet TAKE 1 TABLET BY MOUTH AT BEDTIME   No facility-administered encounter medications on file as of 09/23/2020.    Allergies (verified) Lipitor [atorvastatin]   History: Past Medical History:  Diagnosis Date   Allergy    Diabetes mellitus without complication ([place]    Hyperlipidemia    Hypertension    Past Surgical History:  Procedure Laterality Date   HERNIA REPAIR     TESTICLE SURGERY     TONSILLECTOMY AND ADENOIDECTOMY  VASECTOMY     Family History  Problem Relation Age of Onset   Diabetes Mother    Heart disease Father    Social History   Socioeconomic History   Marital status: Married    Spouse name: Jason Rogers   Number of children: Not on file   Years of education: Not on file   Highest education level: Not on file  Occupational History    Comment: loads steel 3x per week  Tobacco Use   Smoking status: Never   Smokeless tobacco: Never  Vaping Use   Vaping Use: Never used  Substance  and Sexual Activity   Alcohol use: Yes    Comment: rare   Drug use: No   Sexual activity: Not on file  Other Topics Concern   Not on file  Social History Narrative   Not on file   Social Determinants of Health   Financial Resource Strain: Low Risk    Difficulty of Paying Living Expenses: Not very hard  Food Insecurity: No Food Insecurity   Worried About Charity fundraiser in the Last Year: Never true   Ran Out of Food in the Last Year: Never true  Transportation Needs: No Transportation Needs   Lack of Transportation (Medical): No   Lack of Transportation (Non-Medical): No  Physical Activity: Sufficiently Active   Days of Exercise per Week: 3 days   Minutes of Exercise per Session: 120 min  Stress: No Stress Concern Present   Feeling of Stress : Not at all  Social Connections: Moderately Isolated   Frequency of Communication with Friends and Family: More than three times a week   Frequency of Social Gatherings with Friends and Family: More than three times a week   Attends Religious Services: Never   Marine scientist or Organizations: No   Attends Music therapist: Never   Marital Status: Married    Tobacco Counseling Counseling given: Not Answered   Clinical Intake:  Pre-visit preparation completed: Yes  Pain : No/denies pain     BMI - recorded: 33.08 Nutritional Status: BMI > 30  Obese Nutritional Risks: None Diabetes: Yes CBG done?: No Did pt. bring in CBG monitor from home?: No  How often do you need to have someone help you when you read instructions, pamphlets, or other written materials from your doctor or pharmacy?: 1 - Never Nutrition Risk Assessment:  Has the patient had any N/V/D within the last 2 months?  No  Does the patient have any non-healing wounds?  No  Has the patient had any unintentional weight loss or weight gain?  No   Diabetes:  Is the patient diabetic?  Yes  If diabetic, was a CBG obtained today?  No  Did the  patient bring in their glucometer from home?  No  How often do you monitor your CBG's? BID - 136 this am fasting per patient.   Financial Strains and Diabetes Management:  Are you having any financial strains with the device, your supplies or your medication?  [facility] Pharmacists has found assistance for medications .  Does the patient want to be seen by Chronic Care Management for management of their diabetes?  No  Would the patient like to be referred to a Nutritionist or for Diabetic Management?  No   Diabetic Exams:  Diabetic Eye Exam: Completed 11/20/2019  Diabetic Foot Exam: Completed 05/13/2020. Pt has been advised about the importance in completing this exam. Pt is scheduled for diabetic foot exam on  next year.    Interpreter Needed?: No  Information entered by :: Edson Deridder, LPN   Activities of Daily Living In your present state of health, do you have any difficulty performing the following activities: 09/23/2020  Hearing? N  Vision? N  Difficulty concentrating or making decisions? N  Walking or climbing stairs? N  Dressing or bathing? N  Doing errands, shopping? N  Preparing Food and eating ? N  Using the Toilet? N  In the past six months, have you accidently leaked urine? N  Do you have problems with loss of bowel control? N  Managing your Medications? N  Managing your Finances? N  Housekeeping or managing your Housekeeping? N  Some recent data might be hidden    Patient Care Team: Sharion Balloon, FNP as PCP - General (Nurse Practitioner) Chong Sicilian, A. Joneen Caraway, MD (Ophthalmology) Josue Hector, MD as Consulting Physician (Cardiology) Gabriel Carina Betsey Holiday, MD as Physician Assistant (Endocrinology) Harriett Sine, MD as Consulting Physician (Dermatology)  Indicate any recent Medical Services you may have received from other than [facility] providers in the past year (date may be approximate).     Assessment:   This is a routine wellness examination for Jason Rogers.  Hearing/Vision  screen Hearing Screening - Comments:: Denies hearing difficulties  Vision Screening - Comments:: Wears bifocals when needed - up to date with annual eye exams at [facility] in [place] issues and exercise activities discussed: Current Exercise Habits: The patient has a physically strenuous job, but has no regular exercise apart from work., Exercise limited by: None identified   Goals Addressed             This Visit's Progress    HEMOGLOBIN A1C < 7       Diabetic diet, regular daily exercise, 4 bottles of water daily, take medications regularly       Depression Screen PHQ 2/9 Scores 09/23/2020 05/13/2020 10/02/2019 07/03/2019 04/03/2019 12/24/2018 03/07/2018  PHQ - 2 Score 0 0 0 0 0 0 0  PHQ- 9 Score - - - - - 0 -    Fall Risk Fall Risk  09/23/2020 05/13/2020 10/02/2019 07/03/2019 04/03/2019  Falls in the past year? 0 0 0 0 0  Number falls in past yr: 0 - - - -  Injury with Fall? 0 - - - -  Risk for fall due to : Impaired vision - - - -  Follow up Falls prevention discussed - - - -    FALL RISK PREVENTION PERTAINING TO THE HOME:  Any stairs in or around the home? Yes  If so, are there any without handrails? No  Home free of loose throw rugs in walkways, pet beds, electrical cords, etc? Yes  Adequate lighting in your home to reduce risk of falls? Yes   ASSISTIVE DEVICES UTILIZED TO PREVENT FALLS:  Life alert? No  Use of a cane, walker or w/c? No  Grab bars in the bathroom? Yes  Shower chair or bench in shower? No  Elevated toilet seat or a handicapped toilet? No   TIMED UP AND GO:  Was the test performed? No . Telephonic visit  Cognitive Function:     6CIT Screen 09/23/2020  What Year? 0 points  What month? 0 points  What time? 0 points  Count back from 20 0 points  Months in reverse 0 points  Repeat phrase 0 points  Total Score 0    Immunizations Immunization History  Administered Date(s) Administered  Fluad Quad(high Dose 65+) 12/31/2019    Influenza,inj,Quad PF,6+ Mos 04/30/2014, 02/04/2015, 11/18/2015, 12/28/2016, 12/05/2017, 12/05/2018   Moderna SARS-COV2 Booster Vaccination 02/24/2020   Moderna Sars-Covid-2 Vaccination 05/01/2019, 06/02/2019   Pneumococcal Conjugate-13 04/30/2014, 04/03/2019   Pneumococcal Polysaccharide-23 05/13/2020   Tdap 10/28/2011   Zoster Recombinat (Shingrix) 10/10/2018   Zoster, Live 10/02/2019    TDAP status: Up to date  Flu Vaccine status: Up to date  Pneumococcal vaccine status: Up to date  Covid-19 vaccine status: Completed vaccines  Qualifies for Shingles Vaccine? Yes   Zostavax completed Yes   Shingrix Completed?: Yes  Screening Tests Health Maintenance  Topic Date Due   Zoster Vaccines- Shingrix (2 of 2) 12/05/2018   COVID-19 Vaccine (4 - Booster for Moderna series) 05/24/2020   INFLUENZA VACCINE  09/26/2020   HEMOGLOBIN A1C  11/13/2020   OPHTHALMOLOGY EXAM  11/19/2020   FOOT EXAM  05/13/2021   TETANUS/TDAP  10/27/2021   COLONOSCOPY (Pts 45-42yr Insurance coverage will need to be confirmed)  12/30/2024   Hepatitis C Screening  Completed   PNA vac Low Risk Adult  Completed   HPV VACCINES  Aged Out    Health Maintenance  Health Maintenance Due  Topic Date Due   Zoster Vaccines- Shingrix (2 of 2) 12/05/2018   COVID-19 Vaccine (4 - Booster for Moderna series) 05/24/2020    Colorectal cancer screening: Type of screening: Colonoscopy. Completed 12/31/2014. Repeat every 10 years  Lung Cancer Screening: (Low Dose CT Chest recommended if Age 66-80years, 30 pack-year currently smoking OR have quit w/in 15years.) does not qualify.   Additional Screening:  Hepatitis C Screening: does qualify; Completed 12/28/2016  Vision Screening: Recommended annual ophthalmology exams for early detection of glaucoma and other disorders of the eye. Is the patient up to date with their annual eye exam?  Yes  Who is the provider or what is the name of the office in which the patient attends  [facility] exams[facility], would they like to be referred to a provider to establish care? No .   Dental Screening: Recommended annual dental exams for proper oral hygiene  Community Resource Referral / Chronic Care Management: CRR required this visit?  No   CCM required this visit?  No      Plan:     I have personally reviewed and noted the following in the patient's chart:   Medical and social history Use of alcohol, tobacco or illicit drugs  Current medications and supplements including opioid prescriptions. Patient is not currently taking opioid prescriptions. Functional ability and status Nutritional status Physical activity Advanced directives List of other physicians Hospitalizations, surgeries, and ER visits in previous 12 months Vitals Screenings to include cognitive, depression, and falls Referrals and appointments  In addition, I have reviewed and discussed with patient certain preventive protocols, quality metrics, and best practice recommendations. A written personalized care plan for preventive services as well as general preventive health recommendations were provided to patient.     ASandrea Hammond LPN   [id number]  Nurse Notes: None

## 2020-09-27 NOTE — Addendum Note (Signed)
Addended by: Levonne Hubert on: 09/27/2020 08:13 AM   Modules accepted: Orders

## 2020-10-06 ENCOUNTER — Telehealth (HOSPITAL_COMMUNITY): Payer: Self-pay | Admitting: *Deleted

## 2020-10-06 NOTE — Telephone Encounter (Signed)
Attempted to call patient regarding upcoming cardiac CT appointment. °Left message on voicemail with name and callback number ° °Signa Cheek RN Navigator Cardiac Imaging °[facility] °[phone] Office °[phone] Cell ° °

## 2020-10-07 ENCOUNTER — Telehealth (HOSPITAL_COMMUNITY): Payer: Self-pay | Admitting: *Deleted

## 2020-10-07 DIAGNOSIS — E785 Hyperlipidemia, unspecified: Secondary | ICD-10-CM | POA: Diagnosis not present

## 2020-10-07 DIAGNOSIS — E1169 Type 2 diabetes mellitus with other specified complication: Secondary | ICD-10-CM | POA: Diagnosis not present

## 2020-10-07 NOTE — Telephone Encounter (Signed)
Patient's wife returning call regarding upcoming cardiac imaging study; pt verbalizes understanding of appt date/time, parking situation and where to check in, pre-test NPO status and medications ordered, and verified current allergies; name and call back number provided for further questions should they arise  Gordy Clement RN Navigator Cardiac Imaging [facility] [phone] office [phone] cell  Patient to take '100mg'$  metoprolol tartrate 2 hours prior to cardiac CT.

## 2020-10-10 ENCOUNTER — Ambulatory Visit (HOSPITAL_COMMUNITY)
Admission: RE | Admit: 2020-10-10 | Discharge: 2020-10-10 | Disposition: A | Discharge status: Home / Self Care | Payer: Medicare HMO | Source: Ambulatory Visit | Attending: Cardiovascular Disease | Admitting: Cardiovascular Disease

## 2020-10-10 ENCOUNTER — Other Ambulatory Visit: Payer: Self-pay

## 2020-10-10 ENCOUNTER — Other Ambulatory Visit: Payer: Self-pay | Admitting: Cardiovascular Disease

## 2020-10-10 ENCOUNTER — Encounter (HOSPITAL_COMMUNITY): Payer: Self-pay

## 2020-10-10 DIAGNOSIS — R931 Abnormal findings on diagnostic imaging of heart and coronary circulation: Secondary | ICD-10-CM | POA: Insufficient documentation

## 2020-10-10 DIAGNOSIS — I2510 Atherosclerotic heart disease of native coronary artery without angina pectoris: Secondary | ICD-10-CM | POA: Insufficient documentation

## 2020-10-10 DIAGNOSIS — R079 Chest pain, unspecified: Secondary | ICD-10-CM

## 2020-10-10 MED ORDER — IOHEXOL 350 MG/ML SOLN
95.0000 mL | Freq: Once | INTRAVENOUS | Status: AC | PRN
  Administered 2020-10-10: 95 mL via INTRAVENOUS

## 2020-10-10 MED ORDER — NITROGLYCERIN 0.4 MG SL SUBL
SUBLINGUAL_TABLET | SUBLINGUAL | Status: AC
  Administered 2020-10-10: 0.8 mg via SUBLINGUAL
  Filled 2020-10-10: qty 2

## 2020-10-10 MED ORDER — NITROGLYCERIN 0.4 MG SL SUBL: 0.8000 mg | SUBLINGUAL_TABLET | Freq: Once | SUBLINGUAL | Status: AC

## 2020-10-10 NOTE — Progress Notes (Unsigned)
CT FFR ordered.   [place] T. Audie Box, MD, [place]  [address] [place], [place] [address] [phone]  10:39 AM

## 2020-10-11 ENCOUNTER — Telehealth: Payer: Self-pay | Admitting: *Deleted

## 2020-10-11 DIAGNOSIS — R931 Abnormal findings on diagnostic imaging of heart and coronary circulation: Secondary | ICD-10-CM | POA: Diagnosis not present

## 2020-10-11 DIAGNOSIS — I2510 Atherosclerotic heart disease of native coronary artery without angina pectoris: Secondary | ICD-10-CM | POA: Diagnosis not present

## 2020-10-11 MED ORDER — METOPROLOL SUCCINATE ER 25 MG PO TB24: 25.0000 mg | ORAL_TABLET | Freq: Every day | ORAL | 3 refills | Status: DC

## 2020-10-11 MED ORDER — NITROGLYCERIN 0.4 MG SL SUBL: 0.4000 mg | SUBLINGUAL_TABLET | SUBLINGUAL | 3 refills | Status: DC | PRN

## 2020-10-11 NOTE — Telephone Encounter (Signed)
Pt notified and orders placed  

## 2020-10-11 NOTE — Telephone Encounter (Signed)
-----   Message from Josue Hector, MD sent at 10/11/2020  7:51 AM EDT ----- Has moderate disease in RCA and LAD  FFR normal  Add Toprol 25 mg to his ACE and statin F/U 6 months Make sure he has SL nitro to take PRN

## 2020-10-17 DIAGNOSIS — E1169 Type 2 diabetes mellitus with other specified complication: Secondary | ICD-10-CM | POA: Diagnosis not present

## 2020-10-17 DIAGNOSIS — E785 Hyperlipidemia, unspecified: Secondary | ICD-10-CM | POA: Diagnosis not present

## 2020-10-17 DIAGNOSIS — E1159 Type 2 diabetes mellitus with other circulatory complications: Secondary | ICD-10-CM | POA: Diagnosis not present

## 2020-11-11 ENCOUNTER — Encounter: Payer: Self-pay | Admitting: Family

## 2020-11-11 ENCOUNTER — Other Ambulatory Visit: Payer: Self-pay

## 2020-11-11 ENCOUNTER — Ambulatory Visit (INDEPENDENT_AMBULATORY_CARE_PROVIDER_SITE_OTHER): Payer: Medicare HMO | Admitting: Family

## 2020-11-11 VITALS — BP 137/79 | HR 60 | Temp 98.1°F | Resp 20 | Ht 69.0 in | Wt 220.0 lb

## 2020-11-11 DIAGNOSIS — E785 Hyperlipidemia, unspecified: Secondary | ICD-10-CM | POA: Diagnosis not present

## 2020-11-11 DIAGNOSIS — I152 Hypertension secondary to endocrine disorders: Secondary | ICD-10-CM

## 2020-11-11 DIAGNOSIS — Z23 Encounter for immunization: Secondary | ICD-10-CM

## 2020-11-11 DIAGNOSIS — E1159 Type 2 diabetes mellitus with other circulatory complications: Secondary | ICD-10-CM | POA: Diagnosis not present

## 2020-11-11 DIAGNOSIS — E611 Iron deficiency: Secondary | ICD-10-CM

## 2020-11-11 DIAGNOSIS — E669 Obesity, unspecified: Secondary | ICD-10-CM | POA: Diagnosis not present

## 2020-11-11 DIAGNOSIS — E1169 Type 2 diabetes mellitus with other specified complication: Secondary | ICD-10-CM | POA: Diagnosis not present

## 2020-11-11 DIAGNOSIS — E1165 Type 2 diabetes mellitus with hyperglycemia: Secondary | ICD-10-CM | POA: Diagnosis not present

## 2020-11-11 DIAGNOSIS — K219 Gastro-esophageal reflux disease without esophagitis: Secondary | ICD-10-CM | POA: Diagnosis not present

## 2020-11-11 MED ORDER — OMEPRAZOLE 20 MG PO CPDR: 20.0000 mg | DELAYED_RELEASE_CAPSULE | Freq: Every day | ORAL | 1 refills | Status: DC

## 2020-11-11 MED ORDER — LORATADINE 10 MG PO TABS: 10.0000 mg | ORAL_TABLET | Freq: Every day | ORAL | 3 refills | Status: AC

## 2020-11-11 MED ORDER — METFORMIN HCL ER 500 MG PO TB24: ORAL_TABLET | ORAL | 1 refills | Status: DC

## 2020-11-11 MED ORDER — LISINOPRIL 20 MG PO TABS: 20.0000 mg | ORAL_TABLET | Freq: Every day | ORAL | 2 refills | Status: DC

## 2020-11-11 NOTE — Patient Instructions (Signed)
Diabetes Mellitus and Nutrition, Adult When you have diabetes, or diabetes mellitus, it is very important to have healthy eating habits because your blood sugar (glucose) levels are greatly affected by what you eat and drink. Eating healthy foods in the right amounts, at about the same times every day, can help you:  Control your blood glucose.  Lower your risk of heart disease.  Improve your blood pressure.  Reach or maintain a healthy weight. What can affect my meal plan? Every person with diabetes is different, and each person has different needs for a meal plan. Your health care provider may recommend that you work with a dietitian to make a meal plan that is best for you. Your meal plan may vary depending on factors such as:  The calories you need.  The medicines you take.  Your weight.  Your blood glucose, blood pressure, and cholesterol levels.  Your activity level.  Other health conditions you have, such as heart or kidney disease. How do carbohydrates affect me? Carbohydrates, also called carbs, affect your blood glucose level more than any other type of food. Eating carbs naturally raises the amount of glucose in your blood. Carb counting is a method for keeping track of how many carbs you eat. Counting carbs is important to keep your blood glucose at a healthy level, especially if you use insulin or take certain oral diabetes medicines. It is important to know how many carbs you can safely have in each meal. This is different for every person. Your dietitian can help you calculate how many carbs you should have at each meal and for each snack. How does alcohol affect me? Alcohol can cause a sudden decrease in blood glucose (hypoglycemia), especially if you use insulin or take certain oral diabetes medicines. Hypoglycemia can be a life-threatening condition. Symptoms of hypoglycemia, such as sleepiness, dizziness, and confusion, are similar to symptoms of having too much  alcohol.  Do not drink alcohol if: ? Your health care provider tells you not to drink. ? You are pregnant, may be pregnant, or are planning to become pregnant.  If you drink alcohol: ? Do not drink on an empty stomach. ? Limit how much you use to:  0-1 drink a day for women.  0-2 drinks a day for men. ? Be aware of how much alcohol is in your drink. In the [place] one drink equals one 12 oz bottle of beer (355 mL), one 5 oz glass of wine (148 mL), or one 1 oz glass of hard liquor (44 mL). ? Keep yourself hydrated with water, diet soda, or unsweetened iced tea.  Keep in mind that regular soda, juice, and other mixers may contain a lot of sugar and must be counted as carbs. What are tips for following this plan? Reading food labels  Start by checking the serving size on the "Nutrition Facts" label of packaged foods and drinks. The amount of calories, carbs, fats, and other nutrients listed on the label is based on one serving of the item. Many items contain more than one serving per package.  Check the total grams (g) of carbs in one serving. You can calculate the number of servings of carbs in one serving by dividing the total carbs by 15. For example, if a food has 30 g of total carbs per serving, it would be equal to 2 servings of carbs.  Check the number of grams (g) of saturated fats and trans fats in one serving. Choose foods that have   a low amount or none of these fats.  Check the number of milligrams (mg) of salt (sodium) in one serving. Most people should limit total sodium intake to less than 2,300 mg per day.  Always check the nutrition information of foods labeled as "low-fat" or "nonfat." These foods may be higher in added sugar or refined carbs and should be avoided.  Talk to your dietitian to identify your daily goals for nutrients listed on the label. Shopping  Avoid buying canned, pre-made, or processed foods. These foods tend to be high in fat, sodium, and added  sugar.  Shop around the outside edge of the grocery store. This is where you will most often find fresh fruits and vegetables, bulk grains, fresh meats, and fresh dairy. Cooking  Use low-heat cooking methods, such as baking, instead of high-heat cooking methods like deep frying.  Cook using healthy oils, such as olive, canola, or sunflower oil.  Avoid cooking with butter, cream, or high-fat meats. Meal planning  Eat meals and snacks regularly, preferably at the same times every day. Avoid going long periods of time without eating.  Eat foods that are high in fiber, such as fresh fruits, vegetables, beans, and whole grains. Talk with your dietitian about how many servings of carbs you can eat at each meal.  Eat 4-6 oz (112-168 g) of lean protein each day, such as lean meat, chicken, fish, eggs, or tofu. One ounce (oz) of lean protein is equal to: ? 1 oz (28 g) of meat, chicken, or fish. ? 1 egg. ?  cup (62 g) of tofu.  Eat some foods each day that contain healthy fats, such as avocado, nuts, seeds, and fish.   What foods should I eat? Fruits Berries. Apples. Oranges. Peaches. Apricots. Plums. Grapes. Mango. Papaya. Pomegranate. Kiwi. Cherries. Vegetables Lettuce. Spinach. Leafy greens, including kale, chard, collard greens, and mustard greens. Beets. Cauliflower. Cabbage. Broccoli. Carrots. Green beans. Tomatoes. Peppers. Onions. Cucumbers. Brussels sprouts. Grains Whole grains, such as whole-wheat or whole-grain bread, crackers, tortillas, cereal, and pasta. Unsweetened oatmeal. Quinoa. Brown or wild rice. Meats and other proteins Seafood. Poultry without skin. Lean cuts of poultry and beef. Tofu. Nuts. Seeds. Dairy Low-fat or fat-free dairy products such as milk, yogurt, and cheese. The items listed above may not be a complete list of foods and beverages you can eat. Contact a dietitian for more information. What foods should I avoid? Fruits Fruits canned with  syrup. Vegetables Canned vegetables. Frozen vegetables with butter or cream sauce. Grains Refined white flour and flour products such as bread, pasta, snack foods, and cereals. Avoid all processed foods. Meats and other proteins Fatty cuts of meat. Poultry with skin. Breaded or fried meats. Processed meat. Avoid saturated fats. Dairy Full-fat yogurt, cheese, or milk. Beverages Sweetened drinks, such as soda or iced tea. The items listed above may not be a complete list of foods and beverages you should avoid. Contact a dietitian for more information. Questions to ask a health care provider  Do I need to meet with a diabetes educator?  Do I need to meet with a dietitian?  What number can I call if I have questions?  When are the best times to check my blood glucose? Where to find more information:  American Diabetes Association: diabetes.org  Academy of Nutrition and Dietetics: www.eatright.org  National Institute of Diabetes and Digestive and Kidney Diseases: www.niddk.nih.gov  Association of Diabetes Care and Education Specialists: www.diabeteseducator.org Summary  It is important to have healthy eating   habits because your blood sugar (glucose) levels are greatly affected by what you eat and drink.  A healthy meal plan will help you control your blood glucose and maintain a healthy lifestyle.  Your health care provider may recommend that you work with a dietitian to make a meal plan that is best for you.  Keep in mind that carbohydrates (carbs) and alcohol have immediate effects on your blood glucose levels. It is important to count carbs and to use alcohol carefully. This information is not intended to replace advice given to you by your health care provider. Make sure you discuss any questions you have with your health care provider. Document Revised: 01/20/2019 Document Reviewed: 01/20/2019 Elsevier Patient Education  2021 [employer]vier Inc.  

## 2020-11-11 NOTE — Progress Notes (Signed)
Subjective:    Patient ID: Jason Rogers, male    DOB: [birth year], 65 y.o.   MRN: [id number]  Chief Complaint  Patient presents with   Medical Management of Chronic Issues   PT presents to the office today for chronic follow up.  He is followed by Endocrinologists for every 4 months for DM.  Hypertension This is a chronic problem. The current episode started more than 1 year ago. The problem has been resolved since onset. The problem is controlled. Associated symptoms include malaise/fatigue. Pertinent negatives include no peripheral edema or shortness of breath. Risk factors for coronary artery disease include dyslipidemia, obesity and male gender. The current treatment provides moderate improvement. There is no history of CVA or heart failure.  Anemia Presents for follow-up visit. Symptoms include malaise/fatigue. There has been no bruising/bleeding easily. There is no history of heart failure.  Diabetes He presents for his follow-up diabetic visit. He has type 2 diabetes mellitus. Pertinent negatives for diabetic complications include no CVA. Risk factors for coronary artery disease include dyslipidemia, diabetes mellitus, male sex, hypertension and sedentary lifestyle. He is following a generally healthy diet. His overall blood glucose range is 110-130 mg/dl. An ACE inhibitor/angiotensin II receptor blocker is being taken. Eye exam is current.  Gastroesophageal Reflux He complains of belching and heartburn. This is a chronic problem. The current episode started more than 1 year ago. The problem occurs occasionally. Risk factors include obesity. He has tried a PPI for the symptoms. The treatment provided moderate relief.  Hyperlipidemia This is a chronic problem. The current episode started more than 1 year ago. Exacerbating diseases include obesity. Pertinent negatives include no shortness of breath. Current antihyperlipidemic treatment includes statins. The current treatment provides  moderate improvement of lipids. Risk factors for coronary artery disease include dyslipidemia, diabetes mellitus, hypertension, male sex and a sedentary lifestyle.     Review of Systems  Constitutional:  Positive for malaise/fatigue.  Respiratory:  Negative for shortness of breath.   Gastrointestinal:  Positive for heartburn.  Hematological:  Does not bruise/bleed easily.  All other systems reviewed and are negative.     Objective:   Physical Exam Vitals reviewed.  Constitutional:      General: He is not in acute distress.    Appearance: He is well-developed. He is obese.  HENT:     Head: Normocephalic.     Right Ear: Tympanic membrane normal.     Left Ear: Tympanic membrane normal.  Eyes:     General:        Right eye: No discharge.        Left eye: No discharge.     Pupils: Pupils are equal, round, and reactive to light.  Neck:     Thyroid: No thyromegaly.  Cardiovascular:     Rate and Rhythm: Normal rate and regular rhythm.     Heart sounds: Normal heart sounds. No murmur heard. Pulmonary:     Effort: Pulmonary effort is normal. No respiratory distress.     Breath sounds: Normal breath sounds. No wheezing.  Abdominal:     General: Bowel sounds are normal. There is no distension.     Palpations: Abdomen is soft.     Tenderness: There is no abdominal tenderness.  Musculoskeletal:        General: No tenderness. Normal range of motion.     Cervical back: Normal range of motion and neck supple.  Skin:    General: Skin is warm and dry.  Findings: No erythema or rash.  Neurological:     Mental Status: He is alert and oriented to person, place, and time.     Cranial Nerves: No cranial nerve deficit.     Deep Tendon Reflexes: Reflexes are normal and symmetric.  Psychiatric:        Behavior: Behavior normal.        Thought Content: Thought content normal.        Judgment: Judgment normal.      BP 137/79   Pulse 60   Temp 98.1 F (36.7 C) (Temporal)   Resp 20    Ht '5\' 9"'$  (1.753 m)   Wt 220 lb (99.8 kg)   SpO2 98%   BMI 32.49 kg/m      Assessment & Plan:  GIULIO PAULO comes in today with chief complaint of Medical Management of Chronic Issues   Diagnosis and orders addressed:  1. Gastroesophageal reflux disease, unspecified whether esophagitis present - omeprazole (PRILOSEC) 20 MG capsule; Take 1 capsule (20 mg total) by mouth daily.  Dispense: 90 capsule; Refill: 1  2. Type 2 diabetes mellitus with hyperglycemia, without long-term current use of insulin (HCC) - metFORMIN (GLUCOPHAGE-XR) 500 MG 24 hr tablet; TAKE 3 TABLETS BY MOUTH ONCE DAILY WITH BREAKFAST  Dispense: 270 tablet; Refill: 1  3. Hypertension associated with diabetes ([place]) - lisinopril (ZESTRIL) 20 MG tablet; Take 1 tablet (20 mg total) by mouth daily.  Dispense: 90 tablet; Refill: 2  4. Hyperlipidemia associated with type 2 diabetes mellitus ([place])  5. Iron deficiency  6. Obesity (BMI 30-39.9)  7. Need for immunization against influenza - Flu Vaccine QUAD High Dose(Fluad)   Labs pending Health Maintenance reviewed Diet and exercise encouraged  Follow up plan: 6 months   Evelina Dun, FNP

## 2020-11-21 DIAGNOSIS — H5203 Hypermetropia, bilateral: Secondary | ICD-10-CM | POA: Diagnosis not present

## 2020-11-21 DIAGNOSIS — Z0100 Encounter for examination of eyes and vision without abnormal findings: Secondary | ICD-10-CM | POA: Diagnosis not present

## 2020-11-21 LAB — HM DIABETES EYE EXAM

## 2020-12-19 ENCOUNTER — Other Ambulatory Visit: Payer: Self-pay | Admitting: Family

## 2020-12-19 DIAGNOSIS — E1169 Type 2 diabetes mellitus with other specified complication: Secondary | ICD-10-CM

## 2020-12-19 DIAGNOSIS — E785 Hyperlipidemia, unspecified: Secondary | ICD-10-CM

## 2021-01-10 ENCOUNTER — Ambulatory Visit (INDEPENDENT_AMBULATORY_CARE_PROVIDER_SITE_OTHER): Payer: Medicare HMO | Admitting: Nurse Practitioner

## 2021-01-10 ENCOUNTER — Encounter: Payer: Self-pay | Admitting: Nurse Practitioner

## 2021-01-10 DIAGNOSIS — U071 COVID-19: Secondary | ICD-10-CM | POA: Insufficient documentation

## 2021-01-10 MED ORDER — NIRMATRELVIR/RITONAVIR (PAXLOVID)TABLET: 3.0000 | ORAL_TABLET | Freq: Two times a day (BID) | ORAL | 0 refills | Status: AC

## 2021-01-10 NOTE — Progress Notes (Signed)
   Virtual Visit  Note Due to COVID-19 pandemic this visit was conducted virtually. This visit type was conducted due to national recommendations for restrictions regarding the COVID-19 Pandemic (e.g. social distancing, sheltering in place) in an effort to limit this patient's exposure and mitigate transmission in our community. All issues noted in this document were discussed and addressed.  A physical exam was not performed with this format.  I connected with Jason Rogers on 01/10/21 at 8:50 am  by telephone and verified that I am speaking with the correct person using two identifiers. Jason Rogers is currently located at home during visit. The provider, Ivy Lynn, NP is located in their office at time of visit.  I discussed the limitations, risks, security and privacy concerns of performing an evaluation and management service by telephone and the availability of in person appointments. I also discussed with the patient that there may be a patient responsible charge related to this service. The patient expressed understanding and agreed to proceed.   History and Present Illness:  URI  This is a new problem. The current episode started yesterday. The problem has been gradually worsening. There has been no fever. Associated symptoms include congestion and coughing. Pertinent negatives include no abdominal pain, ear pain, headaches, nausea or rash. He has tried nothing for the symptoms.     Review of Systems  Constitutional:  Negative for chills and fever.  HENT:  Positive for congestion. Negative for ear pain.   Respiratory:  Positive for cough.   Gastrointestinal:  Negative for abdominal pain and nausea.  Skin:  Negative for rash.  Neurological:  Negative for headaches.  All other systems reviewed and are negative.   Observations/Objective: Televisit patient not in distress.  Assessment and Plan: Take meds as prescribed - Use a cool mist humidifier  -Use saline nose  sprays frequently -Force fluids -For fever or aches or pains- take Tylenol or ibuprofen. - At home Covid-19 test positive.  -Paxlovid RX sent to pharmacy. Education provided to patient  over the phone. Patient verbalize understanding.  Follow up with worsening unresolved symptoms   Follow Up Instructions: Follow-up with unresolved symptoms.    I discussed the assessment and treatment plan with the patient. The patient was provided an opportunity to ask questions and all were answered. The patient agreed with the plan and demonstrated an understanding of the instructions.   The patient was advised to call back or seek an in-person evaluation if the symptoms worsen or if the condition fails to improve as anticipated.  The above assessment and management plan was discussed with the patient. The patient verbalized understanding of and has agreed to the management plan. Patient is aware to call the clinic if symptoms persist or worsen. Patient is aware when to return to the clinic for a follow-up visit. Patient educated on when it is appropriate to go to the emergency department.   Time call ended: 9 AM  I provided 9 minutes of  non face-to-face time during this encounter.    Ivy Lynn, NP

## 2021-01-10 NOTE — Assessment & Plan Note (Signed)
Take meds as prescribed - Use a cool mist humidifier  -Use saline nose sprays frequently -Force fluids -For fever or aches or pains- take Tylenol or ibuprofen. - At home Covid-19 test positive.  -Paxlovid RX sent to pharmacy. Education provided to patient  over the phone. Patient verbalize understanding.  Follow up with worsening unresolved symptoms

## 2021-01-10 NOTE — Patient Instructions (Signed)
10 Things You Can Do to Manage Your COVID-19 Symptoms at Home ?If you have possible or confirmed COVID-19 ?Stay home except to get medical care. ?Monitor your symptoms carefully. If your symptoms get worse, call your healthcare provider immediately. ?Get rest and stay hydrated. ?If you have a medical appointment, call the healthcare provider ahead of time and tell them that you have or may have COVID-19. ?For medical emergencies, call 911 and notify the dispatch personnel that you have or may have COVID-19. ?Cover your cough and sneezes with a tissue or use the inside of your elbow. ?Wash your hands often with soap and water for at least 20 seconds or clean your hands with an alcohol-based hand sanitizer that contains at least 60% alcohol. ?As much as possible, stay in a specific room and away from other people in your home. Also, you should use a separate bathroom, if available. If you need to be around other people in or outside of the home, wear a mask. ?Avoid sharing personal items with other people in your household, like dishes, towels, and bedding. ?Clean all surfaces that are touched often, like counters, tabletops, and doorknobs. Use household cleaning sprays or wipes according to the label instructions. ?cdc.gov/coronavirus ?09/11/2019 ?This information is not intended to replace advice given to you by your health care provider. Make sure you discuss any questions you have with your health care provider. ?Document Revised: 11/04/2020 Document Reviewed: 11/04/2020 ?Elsevier Patient Education ? 2022 Elsevier Inc. ? ?

## 2021-01-30 DIAGNOSIS — E785 Hyperlipidemia, unspecified: Secondary | ICD-10-CM | POA: Diagnosis not present

## 2021-01-30 DIAGNOSIS — E1169 Type 2 diabetes mellitus with other specified complication: Secondary | ICD-10-CM | POA: Diagnosis not present

## 2021-02-10 DIAGNOSIS — E1169 Type 2 diabetes mellitus with other specified complication: Secondary | ICD-10-CM | POA: Diagnosis not present

## 2021-02-10 DIAGNOSIS — I10 Essential (primary) hypertension: Secondary | ICD-10-CM | POA: Diagnosis not present

## 2021-02-10 DIAGNOSIS — E785 Hyperlipidemia, unspecified: Secondary | ICD-10-CM | POA: Diagnosis not present

## 2021-02-10 DIAGNOSIS — E1159 Type 2 diabetes mellitus with other circulatory complications: Secondary | ICD-10-CM | POA: Diagnosis not present

## 2021-02-28 ENCOUNTER — Encounter: Payer: Self-pay | Admitting: Orthopaedic Surgery

## 2021-02-28 ENCOUNTER — Ambulatory Visit (INDEPENDENT_AMBULATORY_CARE_PROVIDER_SITE_OTHER): Payer: Medicare HMO

## 2021-02-28 ENCOUNTER — Ambulatory Visit: Payer: Medicare HMO | Admitting: Orthopaedic Surgery

## 2021-02-28 VITALS — BP 160/74 | HR 75 | Ht 72.0 in | Wt 230.0 lb

## 2021-02-28 DIAGNOSIS — M25561 Pain in right knee: Secondary | ICD-10-CM

## 2021-02-28 DIAGNOSIS — M94261 Chondromalacia, right knee: Secondary | ICD-10-CM

## 2021-02-28 MED ORDER — BUPIVACAINE HCL 0.25 % IJ SOLN
4.0000 mL | INTRAMUSCULAR | Status: AC | PRN
  Administered 2021-02-28: 4 mL via INTRA_ARTICULAR

## 2021-02-28 MED ORDER — METHYLPREDNISOLONE ACETATE 40 MG/ML IJ SUSP
40.0000 mg | INTRAMUSCULAR | Status: AC | PRN
  Administered 2021-02-28: 40 mg via INTRA_ARTICULAR

## 2021-02-28 MED ORDER — LIDOCAINE HCL 1 % IJ SOLN
0.5000 mL | INTRAMUSCULAR | Status: AC | PRN
  Administered 2021-02-28: .5 mL

## 2021-02-28 NOTE — Progress Notes (Signed)
Office Visit Note   Patient: Jason Rogers           Date of Birth: Jun 05, 1955           MRN: [id number] Visit Date: 02/28/2021              Requested by: Jason Rogers, [address] [place],  [place] [address] PCP: Jason Rogers   Assessment & Plan: Visit Diagnoses:  1. Acute pain of right knee   2. Chondromalacia, right knee     Plan: Right knee injection performed.  He has diabetes his A1c was 6.9 on 01/30/2021.  He will watch sugars carefully and adjust his insulin dosage if needed if he develops some hyperglycemia post knee injection.  Hopefully this is giving him relief and I will have him return if he has persistent symptoms.  Previously in 2016 we discussed the possibility of joint arthroplasty but he still has maintained medial lateral compartments.  He may have more cartilage wear then appreciated by plain radiographs.  Hopefully get the relief with the injection.  Follow-Up Instructions: No follow-ups on file.   Orders:  Orders Placed This Encounter  Procedures   Large Joint Inj: R knee   XR KNEE 3 VIEW RIGHT   No orders of the defined types were placed in this encounter.     Procedures: Large Joint Inj: R knee on 02/28/2021 9:07 AM Indications: pain and joint swelling Details: 22 G 1.5 in needle, anterolateral approach  Arthrogram: No  Medications: 40 mg methylPREDNISolone acetate 40 MG/ML; 0.5 mL lidocaine 1 %; 4 mL bupivacaine 0.25 % Outcome: tolerated well, no immediate complications Procedure, treatment alternatives, risks and benefits explained, specific risks discussed. Consent was given by the patient. Immediately prior to procedure a time out was called to verify the correct patient, procedure, equipment, support staff and site/side marked as required. Patient was prepped and draped in the usual sterile fashion.      Clinical Data: No additional findings.   Subjective: Chief Complaint  Patient presents with   Right Knee -  Pain   Left Wrist - Pain    HPI 66 year old male returns with recurrent problems with his right knee pain when he first gets up difficulty sleeping.  Previous knee arthroscopy 2016 right knee with multiple loose bodies removed 10-15 originally and then another 5-10 during the procedure that were 2 to 4 mm in size.  He had grade 4 changes in the trochlear groove and grade 4 on the patella.  His other problem is pain in the base of the left nondominant thumb he had an injection done by Jason Rogers in [place] and a hand specialist.  He states helped for a while he has a brace that he sleeps and which helps.  Problems with gripping squeezing and he points to base of thumb where he is having problems no triggering of the thumb no carpal tunnel symptoms.  Patient had old metal foreign body in the webspace thumb index which is an old injury not symptomatic.  Review of Systems all other systems noncontributory.   Objective: Vital Signs: BP (!) 160/74    Pulse 75    Ht 6' (1.829 m)    Wt 230 lb (104.3 kg)    BMI 31.19 kg/m   Physical Exam Constitutional:      Appearance: He is well-developed.  HENT:     Head: Normocephalic and atraumatic.     Right Ear: External ear normal.  Left Ear: External ear normal.  Eyes:     Pupils: Pupils are equal, round, and reactive to light.  Neck:     Thyroid: No thyromegaly.     Trachea: No tracheal deviation.  Cardiovascular:     Rate and Rhythm: Normal rate.  Pulmonary:     Effort: Pulmonary effort is normal.     Breath sounds: No wheezing.  Abdominal:     General: Bowel sounds are normal.     Palpations: Abdomen is soft.  Musculoskeletal:     Cervical back: Neck supple.  Skin:    General: Skin is warm and dry.     Capillary Refill: Capillary refill takes less than 2 seconds.  Neurological:     Mental Status: He is alert and oriented to person, place, and time.  Psychiatric:        Behavior: Behavior normal.        Thought Content: Thought  content normal.        Judgment: Judgment normal.    Ortho Exam patient is amatory with minimal symptoms currently.  Has some crepitus knee extension both right and left knee about symmetrical no effusion noted in the right knee collateral crucial ligament exam is normal.  Negative logroll the hips distal pulses are intact.  Pes bursa is normal.  Specialty Comments:  No specialty comments available.  Imaging: XR KNEE 3 VIEW RIGHT  Result Date: 02/28/2021 Standing AP x-rays both knees lateral right knee bilateral sunrise x-rays demonstrate patellofemoral spurring of the right knee with sparing of the left.  Minimal osteophyte formation medial lateral compartment.  Joint space is maintained to both knees. Impression: Degenerative changes patellofemoral joint right knee minimal progression from 2016.    PMFS History: Patient Active Problem List   Diagnosis Date Noted   Positive self-administered antigen test for COVID-19 01/10/2021   Iron deficiency 10/02/2019   GERD (gastroesophageal reflux disease) 12/28/2016   Obesity (BMI 30-39.9) 11/18/2015   Hyperlipidemia associated with type 2 diabetes mellitus ([place]) 04/10/2013   Hypertension associated with diabetes ([place]) 04/10/2013   Diabetes mellitus, type 2 ([place]) 04/10/2013   Past Medical History:  Diagnosis Date   Allergy    Diabetes mellitus without complication ([place])    Hyperlipidemia    Hypertension     Family History  Problem Relation Age of Onset   Diabetes Mother    Heart disease Father     Past Surgical History:  Procedure Laterality Date   HERNIA REPAIR     TESTICLE SURGERY     TONSILLECTOMY AND ADENOIDECTOMY     VASECTOMY     Social History   Occupational History    Comment: loads steel 3x per week  Tobacco Use   Smoking status: Never   Smokeless tobacco: Never  Vaping Use   Vaping Use: Never used  Substance and Sexual Activity   Alcohol use: Yes    Comment: rare   Drug use: No   Sexual activity: Not on file

## 2021-03-04 ENCOUNTER — Encounter: Payer: Self-pay | Admitting: Orthopaedic Surgery

## 2021-03-04 DIAGNOSIS — M25561 Pain in right knee: Secondary | ICD-10-CM

## 2021-03-04 DIAGNOSIS — M94261 Chondromalacia, right knee: Secondary | ICD-10-CM

## 2021-03-14 ENCOUNTER — Telehealth: Payer: Self-pay | Admitting: Orthopaedic Surgery

## 2021-03-14 NOTE — Telephone Encounter (Signed)
LMOM for pt to return phone call to get sch'd for MRI right knee with Dr. Lorin Mercy after 03/15/21

## 2021-03-15 ENCOUNTER — Ambulatory Visit
Admission: RE | Admit: 2021-03-15 | Discharge: 2021-03-15 | Disposition: A | Discharge status: Home / Self Care | Payer: Medicare HMO | Source: Ambulatory Visit | Attending: Orthopaedic Surgery | Admitting: Orthopaedic Surgery

## 2021-03-15 DIAGNOSIS — M25561 Pain in right knee: Secondary | ICD-10-CM

## 2021-03-15 DIAGNOSIS — M94261 Chondromalacia, right knee: Secondary | ICD-10-CM

## 2021-03-15 DIAGNOSIS — M25461 Effusion, right knee: Secondary | ICD-10-CM | POA: Diagnosis not present

## 2021-03-27 ENCOUNTER — Telehealth: Payer: Self-pay | Admitting: Orthopaedic Surgery

## 2021-03-27 NOTE — Telephone Encounter (Signed)
noted 

## 2021-03-27 NOTE — Telephone Encounter (Signed)
Patient's wife Denver Faster called advised patient is in a lot of pain. Mollie asked if Dr Lorin Mercy can have something sent to the pharmacy for him.  Mollie said they are out of town and asked if the Rx can be sent to [facility]  on [address]   in [place]. Ph# to pharmacy is [phone] The number to contact [facility] is [phone]

## 2021-03-27 NOTE — Telephone Encounter (Signed)
Please advise 

## 2021-04-05 ENCOUNTER — Other Ambulatory Visit: Payer: Self-pay

## 2021-04-05 ENCOUNTER — Encounter: Payer: Self-pay | Admitting: Orthopaedic Surgery

## 2021-04-05 ENCOUNTER — Ambulatory Visit: Payer: Medicare HMO | Admitting: Orthopaedic Surgery

## 2021-04-05 VITALS — BP 161/83 | HR 86 | Ht 72.0 in | Wt 230.0 lb

## 2021-04-05 DIAGNOSIS — M25561 Pain in right knee: Secondary | ICD-10-CM

## 2021-04-05 DIAGNOSIS — M23203 Derangement of unspecified medial meniscus due to old tear or injury, right knee: Secondary | ICD-10-CM

## 2021-04-05 DIAGNOSIS — M94261 Chondromalacia, right knee: Secondary | ICD-10-CM

## 2021-04-05 DIAGNOSIS — S83206A Unspecified tear of unspecified meniscus, current injury, right knee, initial encounter: Secondary | ICD-10-CM | POA: Insufficient documentation

## 2021-04-05 NOTE — Progress Notes (Signed)
Office Visit Note   Patient: Jason Rogers           Date of Birth: 1955-09-10           MRN: [id number] Visit Date: 04/05/2021              Requested by: Sharion Balloon, [address] [place],  [place] [address] PCP: Sharion Balloon, FNP   Assessment & Plan: Visit Diagnoses:  1. Acute pain of right knee   2. Chondromalacia, right knee     Plan: Small cyst he had his knee posterior medial with meniscal tear has resolved.  He does have the posterior meniscal tear and also some arthritis in the knee tricompartmental.  Small joint effusion.  He still has maintained his joint space with standing radiographs.  Work slip given no work x3 weeks.  We will set him up with physical therapy.  If he has not improved then knee arthroscopy versus total knee arthroplasty are options.  He does have a meniscal tear but we discussed with him the variable results for knee arthroscopy with arthritis problems that he does have tricompartmental arthritis.  MRI scan reviewed again with copy the report.  I will recheck him in 4 weeks.  Follow-Up Instructions: Return in about 4 weeks (around 05/03/2021).   Orders:  Orders Placed This Encounter  Procedures   Ambulatory referral to Physical Therapy   No orders of the defined types were placed in this encounter.     Procedures: No procedures performed   Clinical Data: No additional findings.   Subjective: Chief Complaint  Patient presents with   Right Knee - Pain, Follow-up    MRI review    HPI [age]-year-old male returns with ongoing problems with his right knee.  He did not get relief with the injection on 02/28/2021.  MRI scan has been obtained and is reviewed today.  Comparison to previous MRI scan 07/01/2014.  Patient is continue to have problems walking his knee catches he is having use the cane.  His work involves walking bending welding metal work.  He has had problems with stairs and is concerned his knee may give way or fall.   He is used Tylenol ibuprofen ice.  He was on meloxicam and stopped it.  He has pain medially in the knee and some lateral.  Problems with stairs.  Review of Systems all systems updated unchanged from previous visit.   Objective: Vital Signs: BP (!) 161/83    Pulse 86    Ht 6' (1.829 m)    Wt 230 lb (104.3 kg)    BMI 31.19 kg/m   Physical Exam Constitutional:      Appearance: He is well-developed.  HENT:     Head: Normocephalic and atraumatic.     Right Ear: External ear normal.     Left Ear: External ear normal.  Eyes:     Pupils: Pupils are equal, round, and reactive to light.  Neck:     Thyroid: No thyromegaly.     Trachea: No tracheal deviation.  Cardiovascular:     Rate and Rhythm: Normal rate.  Pulmonary:     Effort: Pulmonary effort is normal.     Breath sounds: No wheezing.  Abdominal:     General: Bowel sounds are normal.     Palpations: Abdomen is soft.  Musculoskeletal:     Cervical back: Neck supple.  Skin:    General: Skin is warm and dry.  Capillary Refill: Capillary refill takes less than 2 seconds.  Neurological:     Mental Status: He is alert and oriented to person, place, and time.  Psychiatric:        Behavior: Behavior normal.        Thought Content: Thought content normal.        Judgment: Judgment normal.    Ortho Exam negative logroll the hips he has medial joint line tenderness as well as lateral joint line tenderness.  Pain with hyperextension.  Good quad strength.  Anterior tib EHL is intact.  Specialty Comments:  No specialty comments available.  Imaging: Narrative & Impression  CLINICAL DATA:  Chronic right knee pain. Degenerative disease on x-ray. History of arthroscopy in 2016.   EXAM: MRI OF THE RIGHT KNEE WITHOUT CONTRAST   TECHNIQUE: Multiplanar, multisequence MR imaging of the knee was performed. No intravenous contrast was administered.   COMPARISON:  Right knee radiographs 02/28/2021 MRI right knee 07/01/2014    FINDINGS: MENISCI   Medial meniscus: There is a small oblique undersurface tear within the central third of the meniscal triangle of the posterior horn of the medial meniscus (sagittal image 22), appearing similar to prior.   Lateral meniscus:  Intact.   LIGAMENTS   Cruciates: There is again intermediate T2 signal and mild thickening of the ACL, suggesting mucinous degeneration. The PCL is intact.   Collaterals: The medial collateral ligament is intact. There is resolution of the prior small fluid bright multiloculated possible ganglion previously seen bordering the medial aspect of the posterior portion of the medial collateral ligament. The fibular collateral ligament, biceps femoris tendon, iliotibial band, and popliteus tendon are intact.   CARTILAGE   Patellofemoral: Diffuse full-thickness cartilage loss throughout the lateral patellar facet and lateral trochlea with mild lateral trochlear subchondral cystic change. Increased subchondral marrow edema/cystic change within the superior aspect of the lateral patellar facet near the patellar apex.   Medial: Moderate thinning of the weight-bearing medial femoral condyle cartilage with high-grade partial to full-thickness cartilage fissuring, mildly worsened from prior.   Lateral: Mild thinning of the lateral aspect of the weight-bearing lateral femoral condyle cartilage and the lateral tibial plateau cartilage.   Joint: Small joint effusion.Mild edema within the superolateral aspect of Hoffa's fat pad as can be seen with infrapatellar fat pad impingement. No plical thickening.   Popliteal Fossa:  No Baker's cyst.   Extensor Mechanism:  Intact quadriceps tendon and patellar tendon.   Bones:  No acute fracture or dislocation.   Other: None.   IMPRESSION: Compared to 07/01/2014:   1. Unchanged chronic small oblique undersurface tear within the central third of the meniscal triangle of the posterior horn of  the medial meniscus. 2. Resolution of the prior likely small ganglion bordering the posteromedial aspect of the medial collateral ligament. 3. Mild ACL mucinous degeneration, similar to prior. 4. Tricompartmental cartilage degenerative changes, greatest within the patellofemoral compartment. 5. Small joint effusion.     Electronically Signed   By: Yvonne Kendall M.D.   On: 03/16/2021 07:10     PMFS History: Patient Active Problem List   Diagnosis Date Noted   Right knee meniscal tear 04/05/2021   Positive self-administered antigen test for COVID-19 01/10/2021   Iron deficiency 10/02/2019   GERD (gastroesophageal reflux disease) 12/28/2016   Obesity (BMI 30-39.9) 11/18/2015   Hyperlipidemia associated with type 2 diabetes mellitus ([place]) 04/10/2013   Hypertension associated with diabetes ([place]) 04/10/2013   Diabetes mellitus, type 2 ([place]) 04/10/2013  Past Medical History:  Diagnosis Date   Allergy    Diabetes mellitus without complication ([place])    Hyperlipidemia    Hypertension     Family History  Problem Relation Age of Onset   Diabetes Mother    Heart disease Father     Past Surgical History:  Procedure Laterality Date   HERNIA REPAIR     TESTICLE SURGERY     TONSILLECTOMY AND ADENOIDECTOMY     VASECTOMY     Social History   Occupational History    Comment: loads steel 3x per week  Tobacco Use   Smoking status: Never   Smokeless tobacco: Never  Vaping Use   Vaping Use: Never used  Substance and Sexual Activity   Alcohol use: Yes    Comment: rare   Drug use: No   Sexual activity: Not on file

## 2021-04-10 ENCOUNTER — Telehealth: Payer: Self-pay | Admitting: Orthopaedic Surgery

## 2021-04-10 DIAGNOSIS — L814 Other melanin hyperpigmentation: Secondary | ICD-10-CM | POA: Diagnosis not present

## 2021-04-10 DIAGNOSIS — L570 Actinic keratosis: Secondary | ICD-10-CM | POA: Diagnosis not present

## 2021-04-10 DIAGNOSIS — D1801 Hemangioma of skin and subcutaneous tissue: Secondary | ICD-10-CM | POA: Diagnosis not present

## 2021-04-10 DIAGNOSIS — M94261 Chondromalacia, right knee: Secondary | ICD-10-CM | POA: Diagnosis not present

## 2021-04-10 DIAGNOSIS — Z85828 Personal history of other malignant neoplasm of skin: Secondary | ICD-10-CM | POA: Diagnosis not present

## 2021-04-10 DIAGNOSIS — L821 Other seborrheic keratosis: Secondary | ICD-10-CM | POA: Diagnosis not present

## 2021-04-10 DIAGNOSIS — D485 Neoplasm of uncertain behavior of skin: Secondary | ICD-10-CM | POA: Diagnosis not present

## 2021-04-10 DIAGNOSIS — M25561 Pain in right knee: Secondary | ICD-10-CM | POA: Diagnosis not present

## 2021-04-10 DIAGNOSIS — L820 Inflamed seborrheic keratosis: Secondary | ICD-10-CM | POA: Diagnosis not present

## 2021-04-10 NOTE — Telephone Encounter (Signed)
Received $25.00 cash,medical records release form and disability paperwork from patient/forwarding to [facility] today

## 2021-04-12 DIAGNOSIS — M25561 Pain in right knee: Secondary | ICD-10-CM | POA: Diagnosis not present

## 2021-04-12 DIAGNOSIS — M94261 Chondromalacia, right knee: Secondary | ICD-10-CM | POA: Diagnosis not present

## 2021-04-17 DIAGNOSIS — M94261 Chondromalacia, right knee: Secondary | ICD-10-CM | POA: Diagnosis not present

## 2021-04-17 DIAGNOSIS — M25561 Pain in right knee: Secondary | ICD-10-CM | POA: Diagnosis not present

## 2021-04-19 DIAGNOSIS — M94261 Chondromalacia, right knee: Secondary | ICD-10-CM | POA: Diagnosis not present

## 2021-04-19 DIAGNOSIS — M25561 Pain in right knee: Secondary | ICD-10-CM | POA: Diagnosis not present

## 2021-04-24 DIAGNOSIS — M25561 Pain in right knee: Secondary | ICD-10-CM | POA: Diagnosis not present

## 2021-04-24 DIAGNOSIS — M94261 Chondromalacia, right knee: Secondary | ICD-10-CM | POA: Diagnosis not present

## 2021-04-26 DIAGNOSIS — M25561 Pain in right knee: Secondary | ICD-10-CM | POA: Diagnosis not present

## 2021-04-26 DIAGNOSIS — M94261 Chondromalacia, right knee: Secondary | ICD-10-CM | POA: Diagnosis not present

## 2021-04-27 ENCOUNTER — Ambulatory Visit (INDEPENDENT_AMBULATORY_CARE_PROVIDER_SITE_OTHER): Payer: Medicare HMO | Admitting: Orthopaedic Surgery

## 2021-04-27 ENCOUNTER — Other Ambulatory Visit: Payer: Self-pay

## 2021-04-27 ENCOUNTER — Encounter: Payer: Self-pay | Admitting: Orthopaedic Surgery

## 2021-04-27 VITALS — Ht 72.0 in | Wt 230.0 lb

## 2021-04-27 DIAGNOSIS — M6281 Muscle weakness (generalized): Secondary | ICD-10-CM | POA: Diagnosis not present

## 2021-04-27 DIAGNOSIS — E1165 Type 2 diabetes mellitus with hyperglycemia: Secondary | ICD-10-CM

## 2021-04-27 NOTE — Addendum Note (Signed)
Addended by: Meyer Cory on: 04/27/2021 11:23 AM ? ? Modules accepted: Orders ? ?

## 2021-04-27 NOTE — Progress Notes (Addendum)
? ?Office Visit Note ?  ?Patient: Jason Rogers           ?Date of Birth: 03-31-1955           ?MRN: [id number] ?Visit Date: 04/27/2021 ?             ?Requested by: Sharion Balloon, FNP ?[address] ?[place],  [place] [address] ?PCP: Sharion Balloon, FNP ? ? ?Assessment & Plan: ?Visit Diagnoses:  ?1. Type 2 diabetes mellitus with hyperglycemia, without long-term current use of insulin ([place])   ?2. Quadriceps weakness   ? ? ?Plan: Patient with profound quad weakness.  No procedures in the right groin.  This may be disc protrusion or herniation.  He does have some numbness over his thigh but not terribly remarkable but he does have some peripheral neuropathy from diabetes which may be masking the numbness to some degree.  Possible epidural hematoma lumbar spine could also give similar type findings.  We will proceed with urgent lumbar MRI scan.  Work slip given no work x3 weeks.  If MRI does not show significant compression then he may require electrical test repeated by Dr. Ernestina Patches.  Patient has been out of work will give a work note to keep him out for 3 more weeks pending test.  Knee immobilizer he can apply as needed to prevent knee giving way and falling with his quad weakness. ? ?Follow-Up Instructions: No follow-ups on file.  ? ?Orders:  ?No orders of the defined types were placed in this encounter. ? ?No orders of the defined types were placed in this encounter. ? ? ? ? Procedures: ?No procedures performed ? ? ?Clinical Data: ?No additional findings. ? ? ?Subjective: ?Chief Complaint  ?Patient presents with  ? Right Knee - Pain  ? ? ?HPI follow-up knee pain with chondromalacia progressive pain and some numbness in his thigh.  He is working with physical therapy and they noted he is got significant quad weakness and now cannot extend his knee against gravity.  Long-term diabetic greater than 20 years on insulin.  A1c December was 6.9. ? ?Patient has history of foot drop present on electrical studies used an  AFO for period time and then had resolution and return of ankle dorsiflexion on the right side.  Previous MRI several years ago showed some foraminal stenosis on the right at L4-5 and L3-4 both. ? ?Review of Systems all other systems noncontributory to HPI. ? ? ?Objective: ?Vital Signs: Ht 6' (1.829 m)   Wt 230 lb (104.3 kg)   BMI 31.19 kg/m?  ? ?Physical Exam ?Constitutional:   ?   Appearance: He is well-developed.  ?HENT:  ?   Head: Normocephalic and atraumatic.  ?   Right Ear: External ear normal.  ?   Left Ear: External ear normal.  ?Eyes:  ?   Pupils: Pupils are equal, round, and reactive to light.  ?Neck:  ?   Thyroid: No thyromegaly.  ?   Trachea: No tracheal deviation.  ?Cardiovascular:  ?   Rate and Rhythm: Normal rate.  ?Pulmonary:  ?   Effort: Pulmonary effort is normal.  ?   Breath sounds: No wheezing.  ?Abdominal:  ?   General: Bowel sounds are normal.  ?   Palpations: Abdomen is soft.  ?Musculoskeletal:  ?   Cervical back: Neck supple.  ?Skin: ?   General: Skin is warm and dry.  ?   Capillary Refill: Capillary refill takes less than 2 seconds.  ?Neurological:  ?  Mental Status: He is alert and oriented to person, place, and time.  ?Psychiatric:     ?   Behavior: Behavior normal.     ?   Thought Content: Thought content normal.     ?   Judgment: Judgment normal.  ? ? ?Ortho Exam quad weakness on the right no VMO atrophy.  Ankle dorsiflexion plantarflexion is strong right and left.  Adductor to the hip is strong right and left without weakness or atrophy.  Hamstrings are strong. ? ?Specialty Comments:  ?No specialty comments available. ? ?Imaging: ?No results found. ? ? ?PMFS History: ?Patient Active Problem List  ? Diagnosis Date Noted  ? Quadriceps weakness 04/27/2021  ? Right knee meniscal tear 04/05/2021  ? Positive self-administered antigen test for COVID-19 01/10/2021  ? Iron deficiency 10/02/2019  ? GERD (gastroesophageal reflux disease) 12/28/2016  ? Obesity (BMI 30-39.9) 11/18/2015  ?  Hyperlipidemia associated with type 2 diabetes mellitus ([place]) 04/10/2013  ? Hypertension associated with diabetes ([place]) 04/10/2013  ? Diabetes mellitus, type 2 ([place]) 04/10/2013  ? ?Past Medical History:  ?Diagnosis Date  ? Allergy   ? Diabetes mellitus without complication ([place])   ? Hyperlipidemia   ? Hypertension   ?  ?Family History  ?Problem Relation Age of Onset  ? Diabetes Mother   ? Heart disease Father   ?  ?Past Surgical History:  ?Procedure Laterality Date  ? HERNIA REPAIR    ? TESTICLE SURGERY    ? TONSILLECTOMY AND ADENOIDECTOMY    ? VASECTOMY    ? ?Social History  ? ?Occupational History  ?  Comment: loads steel 3x per week  ?Tobacco Use  ? Smoking status: Never  ? Smokeless tobacco: Never  ?Vaping Use  ? Vaping Use: Never used  ?Substance and Sexual Activity  ? Alcohol use: Yes  ?  Comment: rare  ? Drug use: No  ? Sexual activity: Not on file  ? ? ? ? ? ? ?

## 2021-04-28 ENCOUNTER — Telehealth: Payer: Self-pay

## 2021-05-01 ENCOUNTER — Other Ambulatory Visit: Payer: Self-pay | Admitting: Radiology

## 2021-05-01 ENCOUNTER — Ambulatory Visit: Payer: Medicare HMO | Admitting: Cardiovascular Disease

## 2021-05-01 ENCOUNTER — Ambulatory Visit
Admission: RE | Admit: 2021-05-01 | Discharge: 2021-05-01 | Disposition: A | Discharge status: Home / Self Care | Payer: Medicare HMO | Source: Ambulatory Visit | Attending: Orthopaedic Surgery | Admitting: Orthopaedic Surgery

## 2021-05-01 DIAGNOSIS — E1165 Type 2 diabetes mellitus with hyperglycemia: Secondary | ICD-10-CM

## 2021-05-01 DIAGNOSIS — M6281 Muscle weakness (generalized): Secondary | ICD-10-CM

## 2021-05-01 DIAGNOSIS — M48061 Spinal stenosis, lumbar region without neurogenic claudication: Secondary | ICD-10-CM | POA: Diagnosis not present

## 2021-05-02 ENCOUNTER — Encounter: Payer: Self-pay | Admitting: Neurology

## 2021-05-02 ENCOUNTER — Encounter: Payer: Self-pay | Admitting: *Deleted

## 2021-05-02 ENCOUNTER — Other Ambulatory Visit: Payer: Self-pay

## 2021-05-02 DIAGNOSIS — R202 Paresthesia of skin: Secondary | ICD-10-CM

## 2021-05-03 NOTE — Telephone Encounter (Signed)
Error

## 2021-05-04 ENCOUNTER — Telehealth: Payer: Self-pay

## 2021-05-04 NOTE — Telephone Encounter (Signed)
I spoke with patient's wife. She states that he is not able to be seen by neurologist until April 4.  His work note will run out before then and she would like to know if we can extend it until she sees them. She also questioned whether you would like to see him back in the office after that appointment?   ? ?Please advise. She is aware you are out of the office until next week. ?

## 2021-05-04 NOTE — Telephone Encounter (Signed)
Patients wife would like a call back from [facility] regarding a doctors note extension.  ? ?Please advise  ?

## 2021-05-09 NOTE — Telephone Encounter (Signed)
Wife aware note at front desk  ?

## 2021-05-11 ENCOUNTER — Ambulatory Visit: Payer: Medicare HMO | Admitting: Orthopaedic Surgery

## 2021-05-15 ENCOUNTER — Telehealth: Payer: Self-pay

## 2021-05-15 ENCOUNTER — Ambulatory Visit (INDEPENDENT_AMBULATORY_CARE_PROVIDER_SITE_OTHER): Payer: Medicare HMO | Admitting: Family

## 2021-05-15 ENCOUNTER — Encounter: Payer: Self-pay | Admitting: Family

## 2021-05-15 VITALS — BP 135/73 | HR 60 | Temp 97.3°F | Ht 72.0 in | Wt 228.0 lb

## 2021-05-15 DIAGNOSIS — E1159 Type 2 diabetes mellitus with other circulatory complications: Secondary | ICD-10-CM | POA: Diagnosis not present

## 2021-05-15 DIAGNOSIS — R29898 Other symptoms and signs involving the musculoskeletal system: Secondary | ICD-10-CM

## 2021-05-15 DIAGNOSIS — E785 Hyperlipidemia, unspecified: Secondary | ICD-10-CM

## 2021-05-15 DIAGNOSIS — E1169 Type 2 diabetes mellitus with other specified complication: Secondary | ICD-10-CM | POA: Diagnosis not present

## 2021-05-15 DIAGNOSIS — E669 Obesity, unspecified: Secondary | ICD-10-CM

## 2021-05-15 DIAGNOSIS — E611 Iron deficiency: Secondary | ICD-10-CM | POA: Diagnosis not present

## 2021-05-15 DIAGNOSIS — I152 Hypertension secondary to endocrine disorders: Secondary | ICD-10-CM

## 2021-05-15 DIAGNOSIS — Z23 Encounter for immunization: Secondary | ICD-10-CM

## 2021-05-15 DIAGNOSIS — K219 Gastro-esophageal reflux disease without esophagitis: Secondary | ICD-10-CM

## 2021-05-15 DIAGNOSIS — M159 Polyosteoarthritis, unspecified: Secondary | ICD-10-CM | POA: Diagnosis not present

## 2021-05-15 DIAGNOSIS — E1165 Type 2 diabetes mellitus with hyperglycemia: Secondary | ICD-10-CM

## 2021-05-15 LAB — BAYER DCA HB A1C WAIVED: HB A1C (BAYER DCA - WAIVED): 7.3 % — ABNORMAL HIGH (ref 4.8–5.6)

## 2021-05-15 MED ORDER — METFORMIN HCL ER 500 MG PO TB24: ORAL_TABLET | ORAL | 3 refills | Status: DC

## 2021-05-15 MED ORDER — DICLOFENAC SODIUM 1 % EX GEL: 2.0000 g | Freq: Four times a day (QID) | CUTANEOUS | 2 refills | Status: DC

## 2021-05-15 MED ORDER — OMEPRAZOLE 20 MG PO CPDR: 20.0000 mg | DELAYED_RELEASE_CAPSULE | Freq: Every day | ORAL | 3 refills | Status: DC

## 2021-05-15 MED ORDER — LISINOPRIL 20 MG PO TABS: 20.0000 mg | ORAL_TABLET | Freq: Every day | ORAL | 3 refills | Status: DC

## 2021-05-15 MED ORDER — ROSUVASTATIN CALCIUM 20 MG PO TABS: 20.0000 mg | ORAL_TABLET | Freq: Every day | ORAL | 3 refills | Status: DC

## 2021-05-15 NOTE — Telephone Encounter (Signed)
Patient's wife called to let us know that since receiving shingles vaccine this morning her husband has been having chills, feeling achy and is fatigued.  I let her know that this can be a normal reaction to this vaccine and advised he take Tylenol, rest, keep himself hydrated and let us know if symptoms did not improve. ?

## 2021-05-15 NOTE — Progress Notes (Signed)
? ?Subjective:  ? ? Patient ID: Jason Rogers, male    DOB: [birth year], 66 y.o.   MRN: [id number] ? ?Chief Complaint  ?Patient presents with  ? Medical Management of Chronic Issues  ?  Stopped mobic   ? ?PT presents to the office today for chronic follow up.  He is followed by Endocrinologists for every 4 months for DM.  ? ?He states he has been having right knee pain. He did a MRI on his knee that showed, " 1. Unchanged chronic small oblique undersurface tear within the ?central third of the meniscal triangle of the posterior horn of the ?medial meniscus. ?2. Resolution of the prior likely small ganglion bordering the ?posteromedial aspect of the medial collateral ligament. ?3. Mild ACL mucinous degeneration, similar to prior. ?4. Tricompartmental cartilage degenerative changes, greatest within ?the patellofemoral compartment. ?5. Small joint effusion."   ? ?His Lumbar showed, "1. Interval progression of disc degeneration at L5-S1 where there is ?severe right and moderate left neural foraminal narrowing. ?2. Mild spinal canal stenosis and mild-to-moderate bilateral neural ?foraminal narrowing at L4-5. ?3. Mild bilateral neural foraminal narrowing at L3-4 and on the left ?at L2-3." ? ?He is followed by Ortho and has an appt to establish care with a Neurologists 05/30/21. He has been having generalized right sided weakness and pain in his leg. He has been out of work since January.  ?Hypertension ?This is a chronic problem. The current episode started more than 1 year ago. The problem has been waxing and waning since onset. The problem is uncontrolled. Associated symptoms include malaise/fatigue. Pertinent negatives include no blurred vision, peripheral edema or shortness of breath. Risk factors for coronary artery disease include dyslipidemia, diabetes mellitus, obesity and male gender. The current treatment provides moderate improvement. There is no history of PVD.  ?Gastroesophageal Reflux ?He complains of  belching and heartburn. This is a chronic problem. The current episode started more than 1 year ago. The problem occurs occasionally. The problem has been waxing and waning. He has tried a PPI for the symptoms. The treatment provided moderate relief.  ?Hyperlipidemia ?This is a chronic problem. The current episode started more than 1 year ago. The problem is controlled. Recent lipid tests were reviewed and are normal. Exacerbating diseases include obesity. Pertinent negatives include no shortness of breath. Current antihyperlipidemic treatment includes statins. The current treatment provides moderate improvement of lipids. Risk factors for coronary artery disease include dyslipidemia, diabetes mellitus, hypertension, a sedentary lifestyle and obesity.  ?Diabetes ?He presents for his follow-up diabetic visit. He has type 2 diabetes mellitus. Pertinent negatives for diabetes include no blurred vision and no foot paresthesias. Symptoms are stable. Pertinent negatives for diabetic complications include no PVD. Risk factors for coronary artery disease include diabetes mellitus, dyslipidemia, male sex, hypertension, sedentary lifestyle and obesity. His overall blood glucose range is 180-200 mg/dl. Eye exam is current.  ?Anemia ?Presents for follow-up visit. Symptoms include malaise/fatigue.  ? ? ? ?Review of Systems  ?Constitutional:  Positive for malaise/fatigue.  ?Eyes:  Negative for blurred vision.  ?Respiratory:  Negative for shortness of breath.   ?Gastrointestinal:  Positive for heartburn.  ?All other systems reviewed and are negative. ? ?   ?Objective:  ? Physical Exam ?Vitals reviewed.  ?Constitutional:   ?   General: He is not in acute distress. ?   Appearance: He is well-developed. He is obese.  ?HENT:  ?   Head: Normocephalic.  ?   Right Ear: Tympanic membrane normal.  ?  Left Ear: Tympanic membrane normal.  ?Eyes:  ?   General:     ?   Right eye: No discharge.     ?   Left eye: No discharge.  ?   Pupils:  Pupils are equal, round, and reactive to light.  ?Neck:  ?   Thyroid: No thyromegaly.  ?Cardiovascular:  ?   Rate and Rhythm: Normal rate and regular rhythm.  ?   Heart sounds: Normal heart sounds. No murmur heard. ?Pulmonary:  ?   Effort: Pulmonary effort is normal. No respiratory distress.  ?   Breath sounds: Normal breath sounds. No wheezing.  ?Abdominal:  ?   General: Bowel sounds are normal. There is no distension.  ?   Palpations: Abdomen is soft.  ?   Tenderness: There is no abdominal tenderness.  ?Musculoskeletal:     ?   General: Tenderness present.  ?   Cervical back: Normal range of motion and neck supple.  ?   Comments: Right knee weakness with flexion  ?Skin: ?   General: Skin is warm and dry.  ?   Findings: No erythema or rash.  ?Neurological:  ?   Mental Status: He is alert and oriented to person, place, and time.  ?   Cranial Nerves: No cranial nerve deficit.  ?   Deep Tendon Reflexes: Reflexes are normal and symmetric.  ?Psychiatric:     ?   Behavior: Behavior normal.     ?   Thought Content: Thought content normal.     ?   Judgment: Judgment normal.  ? ? ? ? ?BP 135/73   Pulse 60   Temp (!) 97.3 ?F (36.3 ?C) (Temporal)   Ht 6' (1.829 m)   Wt 228 lb (103.4 kg)   BMI 30.92 kg/m?  ? ?   ?Assessment & Plan:  ?Jason Rogers comes in today with chief complaint of Medical Management of Chronic Issues (Stopped mobic ) ? ? ?Diagnosis and orders addressed: ? ?1. Gastroesophageal reflux disease, unspecified whether esophagitis present ?- omeprazole (PRILOSEC) 20 MG capsule; Take 1 capsule (20 mg total) by mouth daily.  Dispense: 90 capsule; Refill: 3 ?- CMP14+EGFR ? ?2. Hyperlipidemia associated with type 2 diabetes mellitus ([facility]) ?- rosuvastatin (CRESTOR) 20 MG tablet; Take 1 tablet (20 mg total) by mouth at bedtime.  Dispense: 90 tablet; Refill: 3 ?- CMP14+EGFR ? ?3. Hypertension associated with diabetes ([place]) ?- lisinopril (ZESTRIL) 20 MG tablet; Take 1 tablet (20 mg total) by mouth daily.   Dispense: 90 tablet; Refill: 3 ?- CMP14+EGFR ? ?4. Type 2 diabetes mellitus with hyperglycemia, without long-term current use of insulin (H[facility]) ?- metFORMIN (GLUCOPHAGE-XR) 500 MG 24 hr tablet; TAKE 3 TABLETS BY MOUTH ONCE DAILY WITH BREAKFAST  Dispense: 270 tablet; Refill: 3 ?- Bayer DCA Hb A1c Waived ?- CMP14+EGFR ? ?5. Obesity (BMI 30-39.9) ?- CMP14+EGFR ? ?6. Iron deficiency ? ?- CMP14+EGFR ?- Anemia Profile B ? ?7. Osteoarthritis of multiple joints, unspecified osteoarthritis type ?- diclofenac Sodium (VOLTAREN) 1 % GEL; Apply 2 g topically 4 (four) times daily.  Dispense: 350 g; Refill: 2 ? ? ?8. Weakness of right lower extremity ?Keep follow up with Neurolgoists  ? ?Labs pending ?Health Maintenance reviewed ?Diet and exercise encouraged ? ?Follow up plan: ?6 months  ? ? ?Evelina Dun, FNP ? ? ? ?

## 2021-05-15 NOTE — Patient Instructions (Signed)

## 2021-05-16 ENCOUNTER — Telehealth: Payer: Self-pay | Admitting: Orthopaedic Surgery

## 2021-05-16 LAB — ANEMIA PROFILE B
Basophils Absolute: 0 10*3/uL (ref 0.0–0.2)
Basos: 1 %
EOS (ABSOLUTE): 0.1 10*3/uL (ref 0.0–0.4)
Eos: 2 %
Ferritin: 52 ng/mL (ref 30–400)
Folate: >20 ng/mL (ref >3.0)
Hematocrit: 46.6 % (ref 37.5–51.0)
Hemoglobin: 15.7 g/dL (ref 13.0–17.7)
Immature Grans (Abs): 0 10*3/uL (ref 0.0–0.1)
Immature Granulocytes: 0 %
Iron Saturation: 31 % (ref 15–55)
Iron: 96 ug/dL (ref 38–169)
Lymphocytes Absolute: 1.7 10*3/uL (ref 0.7–3.1)
Lymphs: 25 %
MCH: 28.9 pg (ref 26.6–33.0)
MCHC: 33.7 g/dL (ref 31.5–35.7)
MCV: 86 fL (ref 79–97)
Monocytes Absolute: 0.7 10*3/uL (ref 0.1–0.9)
Monocytes: 10 %
Neutrophils Absolute: 4.3 10*3/uL (ref 1.4–7.0)
Neutrophils: 62 %
Platelets: 173 10*3/uL (ref 150–450)
RBC: 5.43 x10E6/uL (ref 4.14–5.80)
RDW: 13.7 % (ref 11.6–15.4)
Retic Ct Pct: 1.7 % (ref 0.6–2.6)
Total Iron Binding Capacity: 305 ug/dL (ref 250–450)
UIBC: 209 ug/dL (ref 111–343)
Vitamin B-12: 520 pg/mL (ref 232–1245)
WBC: 6.8 10*3/uL (ref 3.4–10.8)

## 2021-05-16 LAB — CMP14+EGFR
ALT: 18 IU/L (ref 0–44)
AST: 16 IU/L (ref 0–40)
Albumin/Globulin Ratio: 2.2 (ref 1.2–2.2)
Albumin: 4.4 g/dL (ref 3.8–4.8)
Alkaline Phosphatase: 52 IU/L (ref 44–121)
BUN/Creatinine Ratio: 20 (ref 10–24)
BUN: 18 mg/dL (ref 8–27)
Bilirubin Total: 1.4 mg/dL — ABNORMAL HIGH (ref 0.0–1.2)
CO2: 24 mmol/L (ref 20–29)
Calcium: 9.2 mg/dL (ref 8.6–10.2)
Chloride: 102 mmol/L (ref 96–106)
Creatinine, Ser: 0.92 mg/dL (ref 0.76–1.27)
Globulin, Total: 2 g/dL (ref 1.5–4.5)
Glucose: 145 mg/dL — ABNORMAL HIGH (ref 70–99)
Potassium: 4.6 mmol/L (ref 3.5–5.2)
Sodium: 141 mmol/L (ref 134–144)
Total Protein: 6.4 g/dL (ref 6.0–8.5)
eGFR: 91 mL/min/{1.73_m2} (ref >59)

## 2021-05-16 NOTE — Telephone Encounter (Signed)
Pt's wife called requesting a call back from [place]. Please call Mollie at [phone]. ?

## 2021-05-16 NOTE — Telephone Encounter (Signed)
I called and spoke with patient's wife. She is needing copy of last note and out of work note be sent to his disability company and mailed to them. ?She asked if I could call her back in the morning and she will have information ready for me in regards to where everything needs to be sent. Will call back in the morning. ?

## 2021-05-17 ENCOUNTER — Telehealth: Payer: Self-pay | Admitting: Orthopaedic Surgery

## 2021-05-17 NOTE — Telephone Encounter (Signed)
Emailed and faxed per patient request. ?

## 2021-05-17 NOTE — Telephone Encounter (Signed)
Patient's wife Denver Faster called needing to if the disability paperwork was faxed and also wanted to discuss the neurologist appointment that has been moved up to tomorrow.  The number to contact Denver Faster is [phone] ?

## 2021-05-17 NOTE — Telephone Encounter (Signed)
I spoke with patient's wife. She would like for last office note and out of work notes to be sent to: ? ?[employer]ability ?Fax: [phone] ?Email: [email or web address] ?Phone: Marissa [phone] ?Be sure to include claim number [id number] ? ?Patient and wife would also like copies of these emailed to them at:  [email or web address] ?

## 2021-05-17 NOTE — Telephone Encounter (Signed)
I called. Duplicate message in chart. ?

## 2021-05-18 ENCOUNTER — Other Ambulatory Visit: Payer: Self-pay

## 2021-05-18 ENCOUNTER — Ambulatory Visit: Payer: Medicare HMO | Admitting: Neurology

## 2021-05-18 DIAGNOSIS — M5417 Radiculopathy, lumbosacral region: Secondary | ICD-10-CM

## 2021-05-18 DIAGNOSIS — R202 Paresthesia of skin: Secondary | ICD-10-CM

## 2021-05-18 NOTE — Procedures (Signed)
[facility]  ?[address] ? [place],  [address] ?Tel: [phone] ?Fax:  [phone] ?Test Date:  05/18/2021 ? ?Patient: Jason Rogers DOB: [birth year] Physician: Narda Amber, DO  ?Sex: Male Height: '6\' 1"'$  Ref Phys: Rodell Perna, MD  ?ID#: [id number]   Technician:   ? ?Patient Complaints: ?This is a 66 year old man referred for evaluation of right leg weakness. ? ?NCV & EMG Findings: ?Extensive electrodiagnostic testing of the right lower extremity and additional studies of the left shows:  ?Right sural and superficial peroneal sensory responses are within normal limits. ?Right peroneal motor response at the extensor digitorum brevis is reduced, and normal at the tibialis anterior.  Right tibial motor responses within normal limits.  Right femoral motor response shows markedly reduced amplitude (R0.4 mV).  Left femoral motor responses within normal limits. ?Right tibial H reflex study is prolonged. ?Chronic motor axonal loss changes are seen affecting the L3-L4 myotomes bilaterally, worse on the right.  Isolated fibrillation potentials are seen in the right rectus femoris muscle ? ?Impression: ?Subacute L3-4 radiculopathy affecting bilateral lower extremities, worse on the right where findings are severe and with fibrillation potentials.  A right femoral neuropathy cannot be excluded, but is considered to be less likely given the presence of neurogenic changes involving the adductor muscles.  Correlate clinically. ?There is no evidence of a sensorimotor polyneuropathy affecting the right lower extremity. ? ? ?___________________________ ?Narda Amber, DO ? ? ? ?Nerve Conduction Studies ?Anti Sensory Summary Table ? ? Stim Site NR Peak (ms) Norm Peak (ms) P-T Amp (?V) Norm P-T Amp  ?Right Sup Peroneal Anti Sensory (Ant Lat Mall)  32?C  ?12 cm    1.9 <4.6 6.6 >3  ?Site 2    2.5  4.5   ?Right Sural Anti Sensory (Lat Mall)  32?C  ?Calf    3.4 <4.6 7.2 >3  ? ?Motor Summary Table ? ? Stim  Site NR Onset (ms) Norm Onset (ms) O-P Amp (mV) Norm O-P Amp Site1 Site2 Delta-0 (ms) Dist (cm) Vel (m/s) Norm Vel (m/s)  ?Left Femoral Motor (Vastus Med)  32?C  ?Abv Ing Lig    3.9 <6.5 6.5 >3        ?Below Ing Lig    3.9  6.6         ?Right Femoral Motor (Vastus Med)  32?C  ?Abv Ing Lig    6.2 <6.5 0.4 >3        ?Below Ing Lig    9.9  0.2         ?Right Peroneal Motor (Ext Dig Brev)  32?C  ?Ankle    5.0 <6.0 1.2 >2.5 B Fib Ankle 9.3 38.0 41 >40  ?B Fib    14.3  0.9  Poplt B Fib 2.4 10.0 42 >40  ?Poplt    16.7  0.7         ?Right Peroneal TA Motor (Tib Ant)  32?C  ?Fib Head    3.4 <4.5 4.1 >3 Poplit Fib Head 2.1 10.0 48 >40  ?Poplit    5.5  4.1         ?Right Tibial Motor (Abd [place] Crane Brev)  32?C  ?Ankle    4.5 <6.0 4.4 >4 Knee Ankle 10.6 43.0 41 >40  ?Knee    15.1  3.2         ? ?H Reflex Studies ? ? NR H-Lat (ms) Lat Norm (ms) L-R H-Lat (ms)  ?Right Tibial (Gastroc)  32?C  ?  41.22 <35   ? ?EMG ? ? Side Muscle Ins Act Fibs Psw Fasc Number Recrt Dur Dur. Amp Amp. Poly Poly. Comment  ?Right AntTibialis Nml Nml Nml Nml 2- Rapid Some 1+ Some 1+ Some 1+ N/A  ?Right Gastroc Nml Nml Nml Nml Nml Nml Nml Nml Nml Nml Nml Nml N/A  ?Right Flex Dig Long Nml Nml Nml Nml Nml Nml Nml Nml Nml Nml Nml Nml N/A  ?Right Iliopsoas Nml Nml Nml Nml Nml Nml Nml Nml Nml Nml Nml Nml N/A  ?Right GluteusMed Nml Nml Nml Nml Nml Nml Nml Nml Nml Nml Nml Nml N/A  ?Right Lumbo Parasp Low Nml Nml Nml Nml Nml Nml Nml Nml Nml Nml Nml Nml N/A  ?Right VastusLat Nml Nml Nml Nml 2- Rapid Most 1+ Most 1+ Most 1+ N/A  ?Right AdductorLong Nml Nml Nml Nml 2- Rapid Some 1+ Some 1+ Some 1+ N/A  ?Right RectFemoris Nml Nml 1+ Nml SMU Rapid All 1+ All 1+ All 1+ N/A  ?Left RectFemoris Nml Nml Nml Nml 2- Rapid Many 1+ Many 1+ Many 1+ N/A  ?Left AdductorLong Nml Nml Nml Nml Nml Nml Nml Nml Nml Nml Nml Nml N/A  ?Left AntTibialis Nml Nml Nml Nml 2- Rapid Many 1+ Many 1+ Many 1+ N/A  ? ? ? ? ?Waveforms: ?    ? ?    ? ?   ? ? ?

## 2021-05-21 ENCOUNTER — Other Ambulatory Visit: Payer: Self-pay | Admitting: Family

## 2021-05-21 DIAGNOSIS — E611 Iron deficiency: Secondary | ICD-10-CM

## 2021-05-30 ENCOUNTER — Encounter: Payer: Medicare HMO | Admitting: Neurology

## 2021-05-31 ENCOUNTER — Encounter: Payer: Self-pay | Admitting: Orthopaedic Surgery

## 2021-05-31 ENCOUNTER — Ambulatory Visit: Payer: Medicare HMO | Admitting: Orthopaedic Surgery

## 2021-05-31 DIAGNOSIS — M4726 Other spondylosis with radiculopathy, lumbar region: Secondary | ICD-10-CM

## 2021-05-31 NOTE — Progress Notes (Signed)
? ?Office Visit Note ?  ?Patient: Jason Rogers           ?Date of Birth: 28-Oct-1955           ?MRN: [id number] ?Visit Date: 05/31/2021 ?             ?Requested by: Sharion Balloon, FNP ?[address] ?[place],  [place] [address] ?PCP: Sharion Balloon, FNP ? ? ?Assessment & Plan: ?Visit Diagnoses:  ?1. Other spondylosis with radiculopathy, lumbar region   ? ? ?Plan: Discussed patient he likely has some disc protrusion at the L3-4 level which is now not present on the scan and has resolved.  It does not appear this is related in any way to his diabetes since its primarily unilateral his A1c is being in good shape and there is no evidence of peripheral sensorimotor polyneuropathy on electrical test.  Discussed with patient is quite should continue to improve he can continue to do leg lifts on his own.  Once he gets more returned then we can start some exercise in the gym or send him to physical therapy for exercises.  Extensive time discussed electrical test MRI scan pathophysiology of the condition work options.  Wife is present included with this discussion. ? 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  At this point with a weak quad giving way and inability to lock his knee and even lift his knee with less than 50% quad strength he is not able to resume work at this time.  Work slip given no work x2 months recheck 2 months. ? ? ? ?Follow-Up Instructions: No follow-ups on file.  ? ?Orders:  ?No orders of the defined types were placed in this encounter. ? ?No orders of the defined types were placed in this encounter. ? ? ? ? Procedures: ?No procedures performed ? ? ?Clinical Data: ?No additional findings. ? ? ?Subjective: ?Chief Complaint  ?Patient presents with  ? Lower Back - Follow-up  ?  MRI review, EMG/NCS review  ? ? ?HPI 66 year old male returns  post MRI lumbar which shows some progression L5-S1 but no significant nerve compression on the right with that would correspond with his right quad weakness.  EMGs nerve conduction velocities were performed listed below that shows evidence of L3-4 right radiculopathy more than left.  He had some bilateral changes worse on the right and also changes in the abductor which would suggest this is in a femoral neuropathy we do not have to worry about compression in the inguinal canal from hernia etc.  He has noticed some improvement he still has extension lag.  He has been on short-term disability since February 8 when I took him out. ? ?Review of Systems reviewed updated unchanged.  Diabetes last A1c 7.3 it stayed in satisfactory control with A1c is less than 7.5. ? ? ?Objective: ?Vital Signs: BP (!) 157/79   Pulse 73   Ht 6' (1.829 m)   Wt 228 lb (103.4 kg)   BMI 30.92 kg/m?  ? ?Physical Exam ?Constitutional:   ?   Appearance: He is well-developed.  ?HENT:  ?   Head: Normocephalic and atraumatic.  ?   Right Ear: External ear normal.  ?   Left Ear: External ear normal.  ?Eyes:  ?   Pupils: Pupils are equal, round, and reactive  to light.  ?Neck:  ?   Thyroid: No thyromegaly.  ?   Trachea: No tracheal deviation.  ?Cardiovascular:  ?   Rate and Rhythm: Normal rate.  ?Pulmonary:  ?   Effort: Pulmonary effort is normal.  ?   Breath sounds: No wheezing.  ?Abdominal:  ?   General: Bowel sounds are normal.  ?   Palpations: Abdomen is soft.  ?Musculoskeletal:  ?   Cervical back: Neck supple.  ?Skin: ?   General: Skin is warm and dry.  ?   Capillary Refill: Capillary refill takes less than 2 seconds.  ?Neurological:  ?   Mental Status: He is alert and oriented to person, place, and time.  ?Psychiatric:     ?   Behavior: Behavior normal.     ?   Thought Content: Thought content normal.     ?   Judgment: Judgment normal.  ? ? ?Ortho Exam patient has continued quad weakness on the right extension lag 10 degrees.  He is amatory  with a cane he is found that it is easier to walk with a cane in his right hand than left we discussed appropriate cane sequence.  Anterior tib EHL is strong.  No quad atrophy.  Abductor test strong. ? ?Upper extremities strength is strong.  Good cervical range of motion. ? ?Specialty Comments:  ?Jason Rogers [id number] 66 y.o. male  ? ?Interpretation Summary ? ?[facility]  ?[address], [address] ? [place], [place] [address] ?Tel: [phone] ?Fax:  [phone] ?Test Date:  05/18/2021 ?  ?Patient: Jason Rogers DOB: Jun 20, 1955 Physician: Narda Amber, DO  ?Sex: Male Height: '6\' 1"'$  Ref Phys: Rodell Perna, MD  ?ID#: [id number]     Technician:    ?  ?Patient Complaints: ?This is a 66 year old man referred for evaluation of right leg weakness. ?  ?NCV & EMG Findings: ?Extensive electrodiagnostic testing of the right lower extremity and additional studies of the left shows:  ?Right sural and superficial peroneal sensory responses are within normal limits. ?Right peroneal motor response at the extensor digitorum brevis is reduced, and normal at the tibialis anterior.  Right tibial motor responses within normal limits.  Right femoral motor response shows markedly reduced amplitude (R0.4 mV).  Left femoral motor responses within normal limits. ?Right tibial H reflex study is prolonged. ?Chronic motor axonal loss changes are seen affecting the L3-L4 myotomes bilaterally, worse on the right.  Isolated fibrillation potentials are seen in the right rectus femoris muscle ?  ?Impression: ?Subacute L3-4 radiculopathy affecting bilateral lower extremities, worse on the right where findings are severe and with fibrillation potentials.  A right femoral neuropathy cannot be excluded, but is considered to be less likely given the presence of neurogenic changes involving the adductor muscles.  Correlate clinically. ?There is no evidence of a sensorimotor polyneuropathy affecting the right lower extremity. ?  ?   ?___________________________ ?Narda Amber, DO ? ?Imaging: ?Narrative & Impression  ?CLINICAL DATA:  Quadriceps weakness M62.81 (ICD-10-CM). History of ?right L3-4 protrusion. Type 2 diabetes mellitus with hyperglycemia, ?without long-term current use of insulin (HCC) E11.65 (ICD-10-CM). ?  ?EXAM: ?MRI LUMBAR SPINE WITHOUT CONTRAST ?  ?TECHNIQUE: ?Multiplanar, multisequence MR imaging of the lumbar spine was ?performed. No intravenous contrast was administered. ?  ?COMPARISON:  MRI of the lumbar spine September 19, 2014. ?  ?FINDINGS: ?Segmentation:  Standard. ?  ?Alignment:  Trace anterolisthesis of L4 over L5. ?  ?Vertebrae: No fracture, evidence of discitis, or bone lesion. ?Endplate  degenerative changes at L3-4 and L5-S1, progressed since ?prior MRI. ?  ?Conus medullaris and cauda equina: Conus extends to the L1-2 level. ?Conus and cauda equina appear normal. ?  ?Paraspinal and other soft tissues: Left renal cyst. ?  ?Disc levels: ?  ?T12-L1: No spinal canal or neural foraminal. ?  ?L1-2: No spinal canal or neural foraminal stenosis. ?  ?L2-3: Shallow disc bulge with superimposed left foraminal disc ?protrusion and mild facet degenerative changes. Mild left neural ?foraminal narrowing. No significant spinal canal stenosis. ?  ?L3-4: Mild loss of disc height, disc bulge, moderate facet ?degenerative changes and ligamentum flavum redundancy. Findings ?result in mild spinal canal stenosis and mild bilateral neural ?foraminal narrowing, mildly progressed since prior MRI. ?  ?L4-5: Disc bulge with superimposed central disc protrusion, moderate ?facet degenerative changes, left greater than right, with ligamentum ?flavum redundancy and right trace joint effusion. Findings result in ?mild spinal canal stenosis with mild narrowing of the bilateral ?subarticular zones and mild-to-moderate bilateral neural foraminal ?narrowing findings have mildly progressed since prior MRI. ?  ?L5-S1: Progressive loss of disc height, right  asymmetric disc bulge ?with associated osteophytic component, moderate facet degenerative ?changes with ligamentum flavum redundancy, right greater than left. ?Findings result in mild narrowing of the right subarticula

## 2021-06-01 NOTE — Progress Notes (Signed)
CARDIOLOGY CONSULT NOTE  ? ? ? ? ? ?Patient ID: ?Jason Rogers ?MRN: [id number] ?DOB/AGE: [birth year] 66 y.o. ? ?Admit date: (Not on file) ?Referring Physician: FNP Marjorie Smolder ?Primary Physician: Sharion Balloon, FNP ? ? ?HPI:  66 y.o. referred by FNP Marjorie Smolder for chest pain on 09/21/20  . History of DM-2, HLD and HTN. 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 ?Notes allergy ( depression ) with lipitor  ? ?His dad had one of the first CABG;s in [place] early 60's at [facility]  ?Jason Rogers is concerned about family history  ? 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 ?Cardiac CT 10/10/20 calcium score 657 , 85 th percentile CADRADS 3 with 50-69% proximal RCA 40-59% proximal LAD Aortic root 40 mm FFR CT negative  ? ?Started on beta blocker and statin LDL 43 A1c 6.9  ? ?Sees Dr Lorin Mercy for right quad weakness and lumbar radiculopathy No work 8 weeks after office viist 05/31/21 and has been out since February  ? ?Jason Rogers had a couple of episodes of SSCP last week lasted 5 minutes did not take nitro Discussed importance of seeing response to nitro to differentiate pain. No recurrence and no associated symptoms  ? ?Has a lot of trips planned this year including [place]  ? ?ROS ?All other systems reviewed and negative except as noted above ? ?Past Medical History:  ?Diagnosis Date  ? Allergy   ? Diabetes mellitus without complication ([place])   ? Hyperlipidemia   ? Hypertension   ?  ?Family History  ?Problem Relation Age of Onset  ? Diabetes  Mother   ? Heart disease Father   ?  ?Social History  ? ?Socioeconomic History  ? Marital status: Married  ?  Spouse name: Jason Rogers  ? Number of children: Not on file  ? Years of education: Not on file  ? Highest education level: Not on file  ?Occupational History  ?  Comment: loads steel 3x per week  ?Tobacco Use  ? Smoking status: Never  ? Smokeless tobacco: Never  ?Vaping Use  ? Vaping Use: Never used  ?Substance and Sexual Activity  ? Alcohol use: Yes  ?  Comment: rare  ? Drug use: No  ? Sexual activity: Not on file  ?Other Topics Concern  ? Not on file  ?Social History Narrative  ? Not on file  ? ?Social Determinants of Health  ? ?Financial Resource Strain: Low Risk   ? Difficulty of Paying Living Expenses: Not very hard  ?Food Insecurity: No Food Insecurity  ? Worried About Charity fundraiser in the Last Year: Never true  ? Ran Out of Food in the Last Year: Never true  ?Transportation Needs: No Transportation Needs  ? Lack of Transportation (Medical): No  ? Lack of Transportation (Non-Medical): No  ?Physical Activity: Sufficiently Active  ? Days of Exercise per Week: 3 days  ? Minutes of Exercise per Session: 120 min  ?Stress: No Stress  Concern Present  ? Feeling of Stress : Not at all  ?Social Connections: Moderately Isolated  ? Frequency of Communication with Friends and Family: More than three times a week  ? Frequency of Social Gatherings with Friends and Family: More than three times a week  ? Attends Religious Services: Never  ? Active Member of Clubs or Organizations: No  ? Attends Archivist Meetings: Never  ? Marital Status: Married  ?Intimate Partner Violence: Not At Risk  ? Fear of Current or Ex-Partner: No  ? Emotionally Abused: No  ? Physically Abused: No  ? Sexually Abused: No  ?  ?Past Surgical History:  ?Procedure Laterality Date  ? HERNIA REPAIR    ? TESTICLE SURGERY    ? TONSILLECTOMY AND ADENOIDECTOMY    ? VASECTOMY    ?  ? ? ?Current Outpatient Medications:  ?  ascorbic acid  (VITAMIN C) 500 MG tablet, Take by mouth., Disp: , Rfl:  ?  aspirin 81 MG tablet, Take 81 mg by mouth daily., Disp: , Rfl:  ?  cholecalciferol (VITAMIN D3) 25 MCG (1000 UNIT) tablet, Take 1,000 Units by mouth daily., Disp: , Rfl:  ?  dapagliflozin propanediol (FARXIGA) 10 MG TABS tablet, Take 1 tablet (10 mg total) by mouth daily before breakfast., Disp: 90 tablet, Rfl: 3 ?  diclofenac Sodium (VOLTAREN) 1 % GEL, Apply 2 g topically 4 (four) times daily., Disp: 350 g, Rfl: 2 ?  Dulaglutide 3 MG/0.5ML SOPN, Inject into the skin., Disp: , Rfl:  ?  Ferrous Sulfate (IRON) 325 (65 Fe) MG TABS, TAKE 1 TABLET BY MOUTH AT BEDTIME, Disp: 90 tablet, Rfl: 1 ?  Insulin Glargine (BASAGLAR KWIKPEN) 100 UNIT/ML, Inject into the skin daily., Disp: , Rfl:  ?  lisinopril (ZESTRIL) 20 MG tablet, Take 1 tablet (20 mg total) by mouth daily., Disp: 90 tablet, Rfl: 3 ?  loratadine (CLARITIN) 10 MG tablet, Take 1 tablet (10 mg total) by mouth daily., Disp: 90 tablet, Rfl: 3 ?  metFORMIN (GLUCOPHAGE-XR) 500 MG 24 hr tablet, TAKE 3 TABLETS BY MOUTH ONCE DAILY WITH BREAKFAST, Disp: 270 tablet, Rfl: 3 ?  metoprolol succinate (TOPROL XL) 25 MG 24 hr tablet, Take 1 tablet (25 mg total) by mouth daily., Disp: 90 tablet, Rfl: 3 ?  Multiple Vitamin (MULTI-VITAMIN) tablet, Take 1 tablet by mouth daily., Disp: , Rfl:  ?  nitroGLYCERIN (NITROSTAT) 0.4 MG SL tablet, Place 1 tablet (0.4 mg total) under the tongue every 5 (five) minutes as needed for chest pain., Disp: 90 tablet, Rfl: 3 ?  omeprazole (PRILOSEC) 20 MG capsule, Take 1 capsule (20 mg total) by mouth daily., Disp: 90 capsule, Rfl: 3 ?  rosuvastatin (CRESTOR) 20 MG tablet, Take 1 tablet (20 mg total) by mouth at bedtime., Disp: 90 tablet, Rfl: 3 ? ? ? ?Physical Exam: ?There were no vitals taken for this visit.   ? ?Affect appropriate ?Healthy:  appears stated age ?HEENT: normal ?Neck supple with no adenopathy ?JVP normal no bruits no thyromegaly ?Lungs clear with no wheezing and good  diaphragmatic motion ?Heart:  S1/S2 no murmur, no rub, gallop or click ?PMI normal ?Abdomen: benighn, BS positve, no tenderness, no AAA ?no bruit.  No HSM or HJR ?Distal pulses intact with no bruits ?No edema ?Neuro non-focal ?Some persistent soft tissue swelling left hand in splint  ?No muscular weakness ? ? ?Labs: ?  ?Lab Results  ?Component Value Date  ? WBC 6.8 05/15/2021  ? HGB 15.7 05/15/2021  ? HCT  46.6 05/15/2021  ? MCV 86 05/15/2021  ? PLT 173 05/15/2021  ? No results for input(s): NA, K, CL, CO2, BUN, CREATININE, CALCIUM, PROT, BILITOT, ALKPHOS, ALT, AST, GLUCOSE in the last 168 hours. ? ?Invalid input(s): LABALBU ?Lab Results  ?Component Value Date  ? TROPONINI <0.03 03/30/2017  ?  ?Lab Results  ?Component Value Date  ? CHOL 89 (L) 05/13/2020  ? CHOL 143 10/10/2018  ? CHOL 167 06/13/2018  ? ?Lab Results  ?Component Value Date  ? HDL 46 05/13/2020  ? HDL 37 (L) 10/10/2018  ? HDL 36 (L) 06/13/2018  ? ?Lab Results  ?Component Value Date  ? [place] 26 05/13/2020  ? [place] 61 10/10/2018  ? [place] 73 06/13/2018  ? ?Lab Results  ?Component Value Date  ? TRIG 83 05/13/2020  ? TRIG 224 (H) 10/10/2018  ? TRIG 290 (H) 06/13/2018  ? ?Lab Results  ?Component Value Date  ? CHOLHDL 1.9 05/13/2020  ? CHOLHDL 3.9 10/10/2018  ? CHOLHDL 4.6 06/13/2018  ? ?No results found for: LDLDIRECT  ?  ?Radiology: ?NCV with EMG(electromyography) ? ?Result Date: 05/18/2021 ?Alda Berthold, DO     05/18/2021  4:41 PM [facility] [address], [place]  [place], [place] [address] Tel: [phone] Fax:  [phone] Test Date:  05/18/2021 Patient: Jason Rogers DOB: September 24, 1955 Physician: Narda Amber, DO Sex: Male Height: '6\' 1"'$  Ref Phys: Rodell Perna, MD ID#: [id number]   Technician:  Patient Complaints: This is a 66 year old man referred for evaluation of right leg weakness. NCV & EMG Findings: Extensive electrodiagnostic testing of the right lower extremity and additional studies of the left shows: Right sural and  superficial peroneal sensory responses are within normal limits. Right peroneal motor response at the extensor digitorum brevis is reduced, and normal at the tibialis anterior.  Right tibial motor responses within normal limit

## 2021-06-05 DIAGNOSIS — E1169 Type 2 diabetes mellitus with other specified complication: Secondary | ICD-10-CM | POA: Diagnosis not present

## 2021-06-05 DIAGNOSIS — E785 Hyperlipidemia, unspecified: Secondary | ICD-10-CM | POA: Diagnosis not present

## 2021-06-09 ENCOUNTER — Encounter: Payer: Self-pay | Admitting: Cardiovascular Disease

## 2021-06-09 ENCOUNTER — Ambulatory Visit: Payer: Medicare HMO | Admitting: Cardiovascular Disease

## 2021-06-09 VITALS — BP 130/70 | HR 66 | Ht 72.0 in | Wt 222.0 lb

## 2021-06-09 DIAGNOSIS — E782 Mixed hyperlipidemia: Secondary | ICD-10-CM | POA: Diagnosis not present

## 2021-06-09 DIAGNOSIS — R931 Abnormal findings on diagnostic imaging of heart and coronary circulation: Secondary | ICD-10-CM

## 2021-06-09 DIAGNOSIS — R079 Chest pain, unspecified: Secondary | ICD-10-CM | POA: Diagnosis not present

## 2021-06-09 DIAGNOSIS — I10 Essential (primary) hypertension: Secondary | ICD-10-CM

## 2021-06-09 NOTE — Patient Instructions (Signed)
Medication Instructions:  ?Your physician recommends that you continue on your current medications as directed. Please refer to the Current Medication list given to you today. ? ?*If you need a refill on your cardiac medications before your next appointment, please call your pharmacy* ? ? ?Lab Work: ?NONE  ? ?If you have labs (blood work) drawn today and your tests are completely normal, you will receive your results only by: ?MyChart Message (if you have MyChart) OR ?A paper copy in the mail ?If you have any lab test that is abnormal or we need to change your treatment, we will call you to review the results. ? ? ?Testing/Procedures: ?NONE  ? ? ?Follow-Up: ?At [facility], you and your health needs are our priority.  As part of our continuing mission to provide you with exceptional heart care, we have created designated Provider Care Teams.  These Care Teams include your primary Cardiologist (physician) and Advanced Practice Providers (APPs -  Physician Assistants and Nurse Practitioners) who all work together to provide you with the care you need, when you need it. ? ?We recommend signing up for the patient portal called "MyChart".  Sign up information is provided on this After Visit Summary.  MyChart is used to connect with patients for Virtual Visits (Telemedicine).  Patients are able to view lab/test results, encounter notes, upcoming appointments, etc.  Non-urgent messages can be sent to your provider as well.   ?To learn more about what you can do with MyChart, go to [email or web address].   ? ?Your next appointment:   ?1 year(s) ? ?The format for your next appointment:   ?In Person ? ?Provider:   ?Peter Nishan, MD  ? ? ?Other Instructions ?Thank you for choosing [facility]! ? ? ? ?Important Information About Sugar ? ? ? ? ? ? ?

## 2021-06-12 DIAGNOSIS — E1169 Type 2 diabetes mellitus with other specified complication: Secondary | ICD-10-CM | POA: Diagnosis not present

## 2021-06-12 DIAGNOSIS — E785 Hyperlipidemia, unspecified: Secondary | ICD-10-CM | POA: Diagnosis not present

## 2021-06-12 DIAGNOSIS — E1159 Type 2 diabetes mellitus with other circulatory complications: Secondary | ICD-10-CM | POA: Diagnosis not present

## 2021-06-12 DIAGNOSIS — I10 Essential (primary) hypertension: Secondary | ICD-10-CM | POA: Diagnosis not present

## 2021-06-25 ENCOUNTER — Other Ambulatory Visit: Payer: Self-pay | Admitting: Family

## 2021-06-25 DIAGNOSIS — M7072 Other bursitis of hip, left hip: Secondary | ICD-10-CM

## 2021-07-10 ENCOUNTER — Encounter: Payer: Self-pay | Admitting: Family

## 2021-07-10 ENCOUNTER — Ambulatory Visit (INDEPENDENT_AMBULATORY_CARE_PROVIDER_SITE_OTHER): Payer: Medicare HMO | Admitting: Family

## 2021-07-10 VITALS — BP 136/76 | HR 77 | Temp 97.2°F | Ht 73.0 in | Wt 226.6 lb

## 2021-07-10 DIAGNOSIS — S70362A Insect bite (nonvenomous), left thigh, initial encounter: Secondary | ICD-10-CM

## 2021-07-10 DIAGNOSIS — W57XXXA Bitten or stung by nonvenomous insect and other nonvenomous arthropods, initial encounter: Secondary | ICD-10-CM

## 2021-07-10 MED ORDER — DOXYCYCLINE HYCLATE 100 MG PO TABS: 200.0000 mg | ORAL_TABLET | Freq: Two times a day (BID) | ORAL | 0 refills | Status: DC

## 2021-07-10 NOTE — Patient Instructions (Signed)
Tick Bite Information, Adult Ticks are insects that draw blood for food. Most ticks live in shrubs and grassy and wooded areas. They climb onto people and animals that brush against the leaves and grasses that they rest on. Then they bite, attaching themselves to the skin. Most ticks are harmless, but some ticks may carry germs that can spread to a person through a bite and cause a disease. To reduce your risk of getting a disease from a tick bite, make sure you: Take steps to prevent tick bites. Check for ticks after being outdoors where ticks live. Watch for symptoms of disease if a tick attached to you or if you suspect a tick bite. How can I prevent tick bites? Take these steps to help prevent tick bites when you go outdoors in an area where ticks live: Use insect repellent Use insect repellent that has DEET (20% or higher), picaridin, or IR3535 in it. Follow the instructions on the label. Use these products on: Bare skin. The top of your boots. Your pant legs. Your sleeve cuffs. For insect repellent that contains permethrin, follow the instructions on the label. Use these products on: Clothing. Boots. Outdoor gear. Tents. When you are outside Wear protective clothing. Long sleeves and long pants offer the best protection from ticks. Wear light-colored clothing so you can see ticks more easily. Tuck your pant legs into your socks. If you go walking on a trail, stay in the middle of the trail so your skin, hair, and clothing do not touch the bushes. Avoid walking through areas with long grass. Check for ticks on your clothing, hair, and skin often while you are outside, and check again before you go inside. Make sure to check the scalp, neck, armpits, waist, groin, and joint areas. These are the spots where ticks attach themselves most often. When you go indoors Check your clothing for ticks. Tumble dry clothes in a dryer on high heat for at least 10 minutes. If clothes are damp,  additional time may be needed. If clothes require washing, use hot water. Examine gear and pets. Shower soon after being outdoors. Check your body for ticks. Conduct a full body check using a mirror. What is the proper way to remove a tick? If you find a tick on your body, remove it as soon as possible. Removing a tick sooner can prevent germs from passing to your body. Do not remove the tick with your bare fingers. To remove a tick that is crawling on your skin but has not bitten, use either of these methods: Go outdoors and brush the tick off. Remove the tick with tape or a lint roller. To remove a tick that is attached to your skin: Wash your hands. If you have latex gloves, put them on. Use fine-tipped tweezers, curved forceps, or a tick-removal tool to gently grasp the tick as close to your skin and the tick's head as possible. Gently pull with a steady, upward, even pressure until the tick lets go. When removing the tick: Take care to keep the tick's head attached to its body. Do not twist or jerk the tick. This can make the tick's head or mouth parts break off and remain in the skin. Do not squeeze or crush the tick's body. This could force disease-carrying fluids from the tick into your body. Do not try to remove a tick with heat, alcohol, petroleum jelly, or fingernail polish. Using these methods can cause the tick to salivate and regurgitate into your bloodstream,   increasing your risk of getting a disease. What should I do after removing a tick? Dispose of the tick. Do not crush a tick with your fingers. Clean the bite area and your hands with soap and water, rubbing alcohol, or an iodine scrub. If an antiseptic cream or ointment is available, apply a small amount to the bite site. Wash and disinfect any instruments that you used to remove the tick. How should I dispose of a tick? To dispose of a live tick, use one of these methods: Place it in rubbing alcohol. Place it in a sealed  bag or container. Wrap it tightly in tape. Flush it down the toilet. Contact a health care provider if: You have symptoms of a disease after a tick bite. Symptoms of a tick-borne disease can occur from moments after the tick bites to 30 days after a tick is removed. Symptoms include: Fever or chills. Any of these signs in the bite area: A red rash that makes a circle (bull's-eye rash) in the bite area. Redness and swelling. Headache. Muscle, joint, or bone pain. Abnormal tiredness. Numbness in your legs or difficulty walking or moving your legs. Tender, swollen lymph glands. A part of a tick breaks off and gets stuck in your skin. Get help right away if: You are not able to remove a tick. You experience muscle weakness or paralysis. Your symptoms get worse or you experience new symptoms. You find an engorged tick on your skin and you are in an area where disease from ticks is a high risk. Summary Ticks may carry germs that can spread to a person through a bite and cause a disease. Wear protective clothing and use insect repellent to prevent tick bites. Follow the instructions on the label. If you find a tick on your body, remove it as soon as possible. If the tick is attached, do not try to remove with heat, alcohol, petroleum jelly, or fingernail polish. Remove the attached tick using fine-tipped tweezers, curved forceps, or a tick-removal tool. Gently pull with steady, upward, even pressure until the tick lets go. Do not twist or jerk the tick. Do not squeeze or crush the tick's body. If you have symptoms of a disease after being bitten by a tick, contact a health care provider. This information is not intended to replace advice given to you by your health care provider. Make sure you discuss any questions you have with your health care provider. Document Revised: 02/09/2019 Document Reviewed: 02/09/2019 Elsevier Patient Education  2023 [employer]  

## 2021-07-10 NOTE — Progress Notes (Signed)
? ?Subjective:  ? ? Patient ID: Jason Rogers, male    DOB: 08/02/1955, 66 y.o.   MRN: [id number] ? ?Chief Complaint  ?Patient presents with  ? Tick Removal  ?  Patient thinks he got all of the tick removed off his bottom on Saturday. Itching and red   ? ? ?HPI ?PT presents to the office today for possible tick bite on left thigh. States he woke up Saturday morning and noticed his leg was itching and felt like there was something. He scratched his legs, but never saw the tick. He was out in the woods on Friday evening.  ? ?Reports the area is red, itching. Denies any fevers, new joint pain, headaches.  ? ? ?Review of Systems  ?All other systems reviewed and are negative. ? ?   ?Objective:  ? Physical Exam ?Vitals reviewed.  ?Constitutional:   ?   General: He is not in acute distress. ?   Appearance: He is well-developed.  ?HENT:  ?   Head: Normocephalic.  ?Eyes:  ?   General:     ?   Right eye: No discharge.     ?   Left eye: No discharge.  ?   Pupils: Pupils are equal, round, and reactive to light.  ?Neck:  ?   Thyroid: No thyromegaly.  ?Cardiovascular:  ?   Rate and Rhythm: Normal rate and regular rhythm.  ?   Heart sounds: Normal heart sounds. No murmur heard. ?Pulmonary:  ?   Effort: Pulmonary effort is normal. No respiratory distress.  ?   Breath sounds: Normal breath sounds. No wheezing.  ?Abdominal:  ?   General: Bowel sounds are normal. There is no distension.  ?   Palpations: Abdomen is soft.  ?   Tenderness: There is no abdominal tenderness.  ?Musculoskeletal:     ?   General: No tenderness. Normal range of motion.  ?   Cervical back: Normal range of motion and neck supple.  ?Skin: ?   General: Skin is warm and dry.  ?   Findings: Rash present. No erythema.  ? ?    ?   Comments: Erythemas circular rash on left upper thigh. Slightly warm to touch.   ?Neurological:  ?   Mental Status: He is alert and oriented to person, place, and time.  ?   Cranial Nerves: No cranial nerve deficit.  ?   Deep Tendon  Reflexes: Reflexes are normal and symmetric.  ?Psychiatric:     ?   Behavior: Behavior normal.     ?   Thought Content: Thought content normal.     ?   Judgment: Judgment normal.  ? ? ? ? ?BP 136/76   Pulse 77   Temp (!) 97.2 ?F (36.2 ?C) (Temporal)   Ht '6\' 1"'$  (1.854 m)   Wt 226 lb 9.6 oz (102.8 kg)   SpO2 99%   BMI 29.90 kg/m?  ? ?   ?Assessment & Plan:  ? ?ANTWAN PANDYA comes in today with chief complaint of Tick Removal (Patient thinks he got all of the tick removed off his bottom on Saturday. Itching and red ) ? ? ?Diagnosis and orders addressed: ? ?1. Tick bite of left thigh, initial encounter ?-Pt to report any new fever, joint pain, or rash ?-Wear protective clothing while outside- Long sleeves and long pants ?-Put insect repellent on all exposed skin and along clothing ?-Take a shower as soon as possible after being outside ?-Follow up if symptoms  worsen or do not improve  ?- doxycycline (VIBRA-TABS) 100 MG tablet; Take 2 tablets (200 mg total) by mouth 2 (two) times daily.  Dispense: 2 tablet; Refill: 0 ? ?Evelina Dun, FNP ? ? ?

## 2021-07-27 ENCOUNTER — Encounter: Payer: Self-pay | Admitting: Orthopaedic Surgery

## 2021-07-27 ENCOUNTER — Ambulatory Visit (INDEPENDENT_AMBULATORY_CARE_PROVIDER_SITE_OTHER): Payer: Medicare HMO | Admitting: Orthopaedic Surgery

## 2021-07-27 DIAGNOSIS — M4726 Other spondylosis with radiculopathy, lumbar region: Secondary | ICD-10-CM

## 2021-07-27 NOTE — Progress Notes (Signed)
Office Visit Note   Patient: Jason Rogers           Date of Birth: [birth year]           MRN: [id number] Visit Date: 07/27/2021              Requested by: Sharion Balloon, [address],  [place] [address] PCP: Sharion Balloon, FNP   Assessment & Plan: Visit Diagnoses:  1. Other spondylosis with radiculopathy, lumbar region     Plan: Work note given no work x7 weeks recheck 6 weeks.  He will continue to work on straight leg raising.  Follow-Up Instructions: No follow-ups on file.   Orders:  No orders of the defined types were placed in this encounter.  No orders of the defined types were placed in this encounter.     Procedures: No procedures performed   Clinical Data: No additional findings.   Subjective: Chief Complaint  Patient presents with   Right Knee - Pain    Having intermittent pain. Sometimes I can walk for long periods of time and I am no longer using my cane. Feel like 80 to 90% - unable to return to work. Is this going to be my new normal?    HPI 66 year old male returns with ongoing problems with some right quad weakness which is gradually improving.  Previous visit he is at 50% strength now is somewhere in the 6070% strength.  He is at 5 electrical test and likely had a disc bulge at L3-4 level on the right based on electrical studies.  He does have type 2 diabetes on insulin.  Patient's been walking in stores.  Yesterday had problems with weakness that developed when he had been on it with ambulation in the store.  Review of Systems all the systems noncontributory to HPI.   Objective: Vital Signs: There were no vitals taken for this visit.  Physical Exam Constitutional:      Appearance: He is well-developed.  HENT:     Head: Normocephalic and atraumatic.     Right Ear: External ear normal.     Left Ear: External ear normal.  Eyes:     Pupils: Pupils are equal, round, and reactive to light.  Neck:     Thyroid: No  thyromegaly.     Trachea: No tracheal deviation.  Cardiovascular:     Rate and Rhythm: Normal rate.  Pulmonary:     Effort: Pulmonary effort is normal.     Breath sounds: No wheezing.  Abdominal:     General: Bowel sounds are normal.     Palpations: Abdomen is soft.  Musculoskeletal:     Cervical back: Neck supple.  Skin:    General: Skin is warm and dry.     Capillary Refill: Capillary refill takes less than 2 seconds.  Neurological:     Mental Status: He is alert and oriented to person, place, and time.  Psychiatric:        Behavior: Behavior normal.        Thought Content: Thought content normal.        Judgment: Judgment normal.    Ortho Exam patient has some VMO atrophy on the right none on the left.  He can now do a full knee extension on the right I can overcome quad with some hand resistance.  He is walking better.  Specialty Comments:  No specialty comments available.  Imaging: No results found.   PMFS History: Patient  Active Problem List   Diagnosis Date Noted   Other spondylosis with radiculopathy, lumbar region 05/31/2021   Quadriceps weakness 04/27/2021   Right knee meniscal tear 04/05/2021   Iron deficiency 10/02/2019   GERD (gastroesophageal reflux disease) 12/28/2016   Obesity (BMI 30-39.9) 11/18/2015   Hyperlipidemia associated with type 2 diabetes mellitus ([place]) 04/10/2013   Hypertension associated with diabetes ([place]) 04/10/2013   Diabetes mellitus, type 2 ([place]) 04/10/2013   Past Medical History:  Diagnosis Date   Allergy    Diabetes mellitus without complication ([place])    Hyperlipidemia    Hypertension     Family History  Problem Relation Age of Onset   Diabetes Mother    Heart disease Father     Past Surgical History:  Procedure Laterality Date   HERNIA REPAIR     TESTICLE SURGERY     TONSILLECTOMY AND ADENOIDECTOMY     VASECTOMY     Social History   Occupational History    Comment: loads steel 3x per week  Tobacco Use   Smoking  status: Never   Smokeless tobacco: Never  Vaping Use   Vaping Use: Never used  Substance and Sexual Activity   Alcohol use: Yes    Comment: rare   Drug use: No   Sexual activity: Not on file

## 2021-08-02 ENCOUNTER — Ambulatory Visit: Payer: Medicare HMO | Admitting: Orthopaedic Surgery

## 2021-09-07 ENCOUNTER — Ambulatory Visit (INDEPENDENT_AMBULATORY_CARE_PROVIDER_SITE_OTHER): Payer: Medicare HMO | Admitting: Orthopaedic Surgery

## 2021-09-07 DIAGNOSIS — M4726 Other spondylosis with radiculopathy, lumbar region: Secondary | ICD-10-CM | POA: Diagnosis not present

## 2021-09-07 DIAGNOSIS — M6281 Muscle weakness (generalized): Secondary | ICD-10-CM

## 2021-09-07 NOTE — Progress Notes (Deleted)
   Office Visit Note   Patient: Jason Rogers           Date of Birth: 1955-07-30           MRN: [id number] Visit Date: 09/07/2021              Requested by: Sharion Balloon, [address],  [place] [address] PCP: Sharion Balloon, FNP   Assessment & Plan: Visit Diagnoses: No diagnosis found.  Plan: Third Synvisc injection right knee performed as noted improvement in his pain and will return as needed.  Follow-Up Instructions: No follow-ups on file.   Orders:  No orders of the defined types were placed in this encounter.  No orders of the defined types were placed in this encounter.     Procedures: Large Joint Inj: R knee on 09/07/2021 10:06 AM Indications: pain and joint swelling Details: 22 G 1.5 in needle, anterolateral approach  Arthrogram: No  Medications: 0.5 mL lidocaine 1 %; 16 mg hylan 16 MG/2ML Outcome: tolerated well, no immediate complications Procedure, treatment alternatives, risks and benefits explained, specific risks discussed. Consent was given by the patient. Immediately prior to procedure a time out was called to verify the correct patient, procedure, equipment, support staff and site/side marked as required. Patient was prepped and draped in the usual sterile fashion.       Clinical Data: No additional findings.   Subjective: Chief Complaint  Patient presents with   Other     Follow up back/knee leg pain    HPI  Review of Systems   Objective: Vital Signs: There were no vitals taken for this visit.  Physical Exam  Ortho Exam  Specialty Comments:  No specialty comments available.  Imaging: No results found.   PMFS History: Patient Active Problem List   Diagnosis Date Noted   Other spondylosis with radiculopathy, lumbar region 05/31/2021   Quadriceps weakness 04/27/2021   Right knee meniscal tear 04/05/2021   Iron deficiency 10/02/2019   GERD (gastroesophageal reflux disease) 12/28/2016   Obesity (BMI  30-39.9) 11/18/2015   Hyperlipidemia associated with type 2 diabetes mellitus ([place]) 04/10/2013   Hypertension associated with diabetes ([place]) 04/10/2013   Diabetes mellitus, type 2 ([place]) 04/10/2013   Past Medical History:  Diagnosis Date   Allergy    Diabetes mellitus without complication ([place])    Hyperlipidemia    Hypertension     Family History  Problem Relation Age of Onset   Diabetes Mother    Heart disease Father     Past Surgical History:  Procedure Laterality Date   HERNIA REPAIR     TESTICLE SURGERY     TONSILLECTOMY AND ADENOIDECTOMY     VASECTOMY     Social History   Occupational History    Comment: loads steel 3x per week  Tobacco Use   Smoking status: Never   Smokeless tobacco: Never  Vaping Use   Vaping Use: Never used  Substance and Sexual Activity   Alcohol use: Yes    Comment: rare   Drug use: No   Sexual activity: Not on file

## 2021-09-07 NOTE — Progress Notes (Signed)
Office Visit Note   Patient: Jason Rogers           Date of Birth: 29-May-1955           MRN: [id number] Visit Date: 09/07/2021              Requested by: Sharion Balloon, [address] [place],   [id number] PCP: Sharion Balloon, FNP   Assessment & Plan: Visit Diagnoses:  1. Quadriceps weakness   2. Other spondylosis with radiculopathy, lumbar region     Plan: Quad weakness is resolved work slip given resume regular work 09/12/2021.  Office follow-up as needed.  Follow-Up Instructions: No follow-ups on file.   Orders:  No orders of the defined types were placed in this encounter.  No orders of the defined types were placed in this encounter.     Procedures: No procedures performed   Clinical Data: No additional findings.   Subjective: Chief Complaint  Patient presents with   Other     Follow up back/knee leg pain    HPI 66 year old male seen in follow-up with severe white blood weakness that was present on EMG nerve conduction velocities with MRI showing no compression by the time he got into his MRI he does have diabetes A1c 7.8.  Is taking several months now his quad strength is good he is able to go up and down on a step and wants to resume work on 09/12/2021.  Review of Systems all systems updated unchanged from 05/31/2021 office visit.   Objective: Vital Signs: There were no vitals taken for this visit.  Physical Exam Constitutional:      Appearance: He is well-developed.  HENT:     Head: Normocephalic and atraumatic.     Right Ear: External ear normal.     Left Ear: External ear normal.  Eyes:     Pupils: Pupils are equal, round, and reactive to light.  Neck:     Thyroid: No thyromegaly.     Trachea: No tracheal deviation.  Cardiovascular:     Rate and Rhythm: Normal rate.  Pulmonary:     Effort: Pulmonary effort is normal.     Breath sounds: No wheezing.  Abdominal:     General: Bowel sounds are normal.     Palpations:  Abdomen is soft.  Musculoskeletal:     Cervical back: Neck supple.  Skin:    General: Skin is warm and dry.     Capillary Refill: Capillary refill takes less than 2 seconds.  Neurological:     Mental Status: He is alert and oriented to person, place, and time.  Psychiatric:        Behavior: Behavior normal.        Thought Content: Thought content normal.        Judgment: Judgment normal.     Ortho Exam patient gets from sitting standing easily.  Excellent resistance with sitting position quad resistive testing.  (Previous could   overcome his quad with 1 finger resistance.)  Anterior tib is intact sensation is intact.  Specialty Comments:  No specialty comments available.  Imaging: No results found.   PMFS History: Patient Active Problem List   Diagnosis Date Noted   Other spondylosis with radiculopathy, lumbar region 05/31/2021   Quadriceps weakness 04/27/2021   Right knee meniscal tear 04/05/2021   Iron deficiency 10/02/2019   GERD (gastroesophageal reflux disease) 12/28/2016   Obesity (BMI 30-39.9) 11/18/2015   Hyperlipidemia associated with type 2 diabetes  mellitus ([facility]) 04/10/2013   Hypertension associated with diabetes ([place] [facility]) 04/10/2013   Diabetes mellitus, type 2 ([place]) 04/10/2013   Past Medical History:  Diagnosis Date   Allergy    Diabetes mellitus without complication ([place])    Hyperlipidemia    Hypertension     Family History  Problem Relation Age of Onset   Diabetes Mother    Heart disease Father     Past Surgical History:  Procedure Laterality Date   HERNIA REPAIR     TESTICLE SURGERY     TONSILLECTOMY AND ADENOIDECTOMY     VASECTOMY     Social History   Occupational History    Comment: loads steel 3x per week  Tobacco Use   Smoking status: Never   Smokeless tobacco: Never  Vaping Use   Vaping Use: Never used  Substance and Sexual Activity   Alcohol use: Yes    Comment: rare   Drug use: No   Sexual activity: Not on file

## 2021-09-20 ENCOUNTER — Other Ambulatory Visit: Payer: Self-pay | Admitting: Cardiovascular Disease

## 2021-09-26 ENCOUNTER — Telehealth: Payer: Self-pay | Admitting: Cardiovascular Disease

## 2021-09-26 ENCOUNTER — Ambulatory Visit: Payer: Medicare HMO

## 2021-09-26 MED ORDER — NITROGLYCERIN 0.4 MG SL SUBL
0.4000 mg | SUBLINGUAL_TABLET | SUBLINGUAL | 3 refills | Status: AC | PRN
Start: 2021-09-26 — End: 2024-01-21

## 2021-09-26 NOTE — Telephone Encounter (Signed)
*  STAT* If patient is at the pharmacy, call can be transferred to refill team.   1. Which medications need to be refilled? (please list name of each medication and dose if known)  nitroGLYCERIN (NITROSTAT) 0.4 MG SL tablet  2. Which pharmacy/location (including street and city if local pharmacy) is medication to be sent to? [place], [place] [address]  3. Do they need a 30 day or 90 day supply? 30 day

## 2021-10-27 DIAGNOSIS — E1159 Type 2 diabetes mellitus with other circulatory complications: Secondary | ICD-10-CM | POA: Diagnosis not present

## 2021-11-01 ENCOUNTER — Other Ambulatory Visit: Payer: Self-pay | Admitting: Family

## 2021-11-01 DIAGNOSIS — E611 Iron deficiency: Secondary | ICD-10-CM

## 2021-11-06 DIAGNOSIS — E1169 Type 2 diabetes mellitus with other specified complication: Secondary | ICD-10-CM | POA: Diagnosis not present

## 2021-11-06 DIAGNOSIS — I10 Essential (primary) hypertension: Secondary | ICD-10-CM | POA: Diagnosis not present

## 2021-11-06 DIAGNOSIS — E785 Hyperlipidemia, unspecified: Secondary | ICD-10-CM | POA: Diagnosis not present

## 2021-11-06 DIAGNOSIS — E1159 Type 2 diabetes mellitus with other circulatory complications: Secondary | ICD-10-CM | POA: Diagnosis not present

## 2021-11-13 ENCOUNTER — Encounter: Payer: Self-pay | Admitting: Family

## 2021-11-13 ENCOUNTER — Ambulatory Visit (INDEPENDENT_AMBULATORY_CARE_PROVIDER_SITE_OTHER): Payer: Medicare HMO | Admitting: Family

## 2021-11-13 ENCOUNTER — Telehealth: Payer: Self-pay | Admitting: Family

## 2021-11-13 VITALS — BP 152/87 | HR 68 | Temp 97.3°F | Ht 73.0 in | Wt 225.2 lb

## 2021-11-13 DIAGNOSIS — Z0000 Encounter for general adult medical examination without abnormal findings: Secondary | ICD-10-CM | POA: Diagnosis not present

## 2021-11-13 DIAGNOSIS — K219 Gastro-esophageal reflux disease without esophagitis: Secondary | ICD-10-CM

## 2021-11-13 DIAGNOSIS — Z0001 Encounter for general adult medical examination with abnormal findings: Secondary | ICD-10-CM | POA: Diagnosis not present

## 2021-11-13 DIAGNOSIS — Z23 Encounter for immunization: Secondary | ICD-10-CM | POA: Diagnosis not present

## 2021-11-13 DIAGNOSIS — E663 Overweight: Secondary | ICD-10-CM | POA: Diagnosis not present

## 2021-11-13 DIAGNOSIS — E1165 Type 2 diabetes mellitus with hyperglycemia: Secondary | ICD-10-CM

## 2021-11-13 DIAGNOSIS — I152 Hypertension secondary to endocrine disorders: Secondary | ICD-10-CM | POA: Diagnosis not present

## 2021-11-13 DIAGNOSIS — E1169 Type 2 diabetes mellitus with other specified complication: Secondary | ICD-10-CM

## 2021-11-13 DIAGNOSIS — Z125 Encounter for screening for malignant neoplasm of prostate: Secondary | ICD-10-CM | POA: Diagnosis not present

## 2021-11-13 DIAGNOSIS — M4726 Other spondylosis with radiculopathy, lumbar region: Secondary | ICD-10-CM

## 2021-11-13 DIAGNOSIS — E611 Iron deficiency: Secondary | ICD-10-CM | POA: Diagnosis not present

## 2021-11-13 DIAGNOSIS — M23203 Derangement of unspecified medial meniscus due to old tear or injury, right knee: Secondary | ICD-10-CM

## 2021-11-13 DIAGNOSIS — E1159 Type 2 diabetes mellitus with other circulatory complications: Secondary | ICD-10-CM

## 2021-11-13 DIAGNOSIS — E785 Hyperlipidemia, unspecified: Secondary | ICD-10-CM | POA: Diagnosis not present

## 2021-11-13 LAB — BAYER DCA HB A1C WAIVED: HB A1C (BAYER DCA - WAIVED): 7.1 % — ABNORMAL HIGH (ref 4.8–5.6)

## 2021-11-13 MED ORDER — MELOXICAM 15 MG PO TABS: 15.0000 mg | ORAL_TABLET | Freq: Every day | ORAL | 1 refills | Status: DC

## 2021-11-13 MED ORDER — METFORMIN HCL ER 500 MG PO TB24: 2000.0000 mg | ORAL_TABLET | Freq: Every day | ORAL | 1 refills | Status: AC

## 2021-11-13 NOTE — Patient Instructions (Signed)
Health Maintenance After Age 67 After age 67, you are at a higher risk for certain long-term diseases and infections as well as injuries from falls. Falls are a major cause of broken bones and head injuries in people who are older than age 67. Getting regular preventive care can help to keep you healthy and well. Preventive care includes getting regular testing and making lifestyle changes as recommended by your health care provider. Talk with your health care provider about: Which screenings and tests you should have. A screening is a test that checks for a disease when you have no symptoms. A diet and exercise plan that is right for you. What should I know about screenings and tests to prevent falls? Screening and testing are the best ways to find a health problem early. Early diagnosis and treatment give you the best chance of managing medical conditions that are common after age 67. Certain conditions and lifestyle choices may make you more likely to have a fall. Your health care provider may recommend: Regular vision checks. Poor vision and conditions such as cataracts can make you more likely to have a fall. If you wear glasses, make sure to get your prescription updated if your vision changes. Medicine review. Work with your health care provider to regularly review all of the medicines you are taking, including over-the-counter medicines. Ask your health care provider about any side effects that may make you more likely to have a fall. Tell your health care provider if any medicines that you take make you feel dizzy or sleepy. Strength and balance checks. Your health care provider may recommend certain tests to check your strength and balance while standing, walking, or changing positions. Foot health exam. Foot pain and numbness, as well as not wearing proper footwear, can make you more likely to have a fall. Screenings, including: Osteoporosis screening. Osteoporosis is a condition that causes  the bones to get weaker and break more easily. Blood pressure screening. Blood pressure changes and medicines to control blood pressure can make you feel dizzy. Depression screening. You may be more likely to have a fall if you have a fear of falling, feel depressed, or feel unable to do activities that you used to do. Alcohol use screening. Using too much alcohol can affect your balance and may make you more likely to have a fall. Follow these instructions at home: Lifestyle Do not drink alcohol if: Your health care provider tells you not to drink. If you drink alcohol: Limit how much you have to: 0-1 drink a day for women. 0-2 drinks a day for men. Know how much alcohol is in your drink. In the [place] one drink equals one 12 oz bottle of beer (355 mL), one 5 oz glass of wine (148 mL), or one 1 oz glass of hard liquor (44 mL). Do not use any products that contain nicotine or tobacco. These products include cigarettes, chewing tobacco, and vaping devices, such as e-cigarettes. If you need help quitting, ask your health care provider. Activity  Follow a regular exercise program to stay fit. This will help you maintain your balance. Ask your health care provider what types of exercise are appropriate for you. If you need a cane or walker, use it as recommended by your health care provider. Wear supportive shoes that have nonskid soles. Safety  Remove any tripping hazards, such as rugs, cords, and clutter. Install safety equipment such as grab bars in bathrooms and safety rails on stairs. Keep rooms and walkways   well-lit. General instructions Talk with your health care provider about your risks for falling. Tell your health care provider if: You fall. Be sure to tell your health care provider about all falls, even ones that seem minor. You feel dizzy, tiredness (fatigue), or off-balance. Take over-the-counter and prescription medicines only as told by your health care provider. These include  supplements. Eat a healthy diet and maintain a healthy weight. A healthy diet includes low-fat dairy products, low-fat (lean) meats, and fiber from whole grains, beans, and lots of fruits and vegetables. Stay current with your vaccines. Schedule regular health, dental, and eye exams. Summary Having a healthy lifestyle and getting preventive care can help to protect your health and wellness after age 67. Screening and testing are the best way to find a health problem early and help you avoid having a fall. Early diagnosis and treatment give you the best chance for managing medical conditions that are more common for people who are older than age 67. Falls are a major cause of broken bones and head injuries in people who are older than age 67. Take precautions to prevent a fall at home. Work with your health care provider to learn what changes you can make to improve your health and wellness and to prevent falls. This information is not intended to replace advice given to you by your health care provider. Make sure you discuss any questions you have with your health care provider. Document Revised: 07/04/2020 Document Reviewed: 07/04/2020 Elsevier Patient Education  2023 [employer]  

## 2021-11-13 NOTE — Telephone Encounter (Signed)
[facility]  Advanced Heart Failure and Transplant  Heart Transplant Clinic  Follow-up Visit    Primary Care Physician: Brodsky, Mark E  Referring Provider: Brett Justin Berman  Date of Transplant: 04/17/2019  Organ(s) Transplanted: heart  Indication for transplant: Dilated Myopathy: Idiopathic  PHS increased risk donor: Yes    ID. 66 year old male with end-stage HFrEF 2/2 NICM s/p OHT 04/17/19, history of 2R, HTN, HLD and anxiety coming in for f/u of heart transplant.    Interval History:    The patient was last seen on 06/12/21. At that time issues with pain after urologic procedure.    He continues to deal with pain issue largely from prostate surgery. He still has some bleeding and some tissue come out. He tried different strategies and nothing helped. This is really impacting quality of life. He gets tired and frustrated and does not want to take it out on his family.    ROS:  A complete ROS was performed and is negative except as documented in the HPI.      Allergies:  Patient is allergic to cats [other] and dogs [other].    Past Medical History:   Diagnosis Date    Asthma     Atrial fibrillation (CMS-HCC)     Chronic HFrEF (heart failure with reduced ejection fraction) (CMS-HCC)     GERD (gastroesophageal reflux disease)     HTN (hypertension)     Insomnia     Nephrolithiasis     Sinusitis      Patient Active Problem List   Diagnosis    COPD (chronic obstructive pulmonary disease) (CMS-HCC)    Heart transplant, orthotopic, 04/17/2019    Pericardial effusion    Hypertension    Chronic back pain    At risk for infection transmitted from donor    Acute hepatitis C virus infection    Heart transplanted (CMS-HCC)    Acute UTI    Umbilical hernia without obstruction and without gangrene    COVID-19 virus detected    Acute medial meniscus tear of left knee, sequela    Localized osteoarthritis of left knee     Past Surgical History:   Procedure Laterality Date    CARDIAC DEFIBRILLATOR PLACEMENT       PB ANESTH,SHOULDER JOINT,NOS Right      Family History   Problem Relation Name Age of Onset    Hypertension Other      Other Maternal Grandmother          kidney disease needing HD     Social History     Socioeconomic History    Marital status: Single     Spouse name: Not on file    Number of children: Not on file    Years of education: Not on file    Highest education level: Not on file   Occupational History    Not on file   Tobacco Use    Smoking status: Never    Smokeless tobacco: Never    Tobacco comments:     from friends and relatives    Substance and Sexual Activity    Alcohol use: Not Currently     Comment: Prior heavier use, but completely quit in 2016    Drug use: Yes     Comment: eats edible marijuana for pain and insomnia     Sexual activity: Not on file   Other Topics Concern    Not on file   Social History Narrative      Born in [place], also lived in [place], [place], [place], no travel, worked as a carpenter, occasional cedar, no birds, no hot tubs, worked in construction + possible asbestos exposure      Social Determinants of Health     Financial Resource Strain: Not on file   Food Insecurity: Not on file   Transportation Needs: Not on file   Physical Activity: Not on file   Stress: Not on file   Social Connections: Not on file   Intimate Partner Violence: Not on file   Housing Stability: Not on file     Current Outpatient Medications   Medication Sig    albuterol 108 (90 Base) MCG/ACT inhaler Inhale 2 puffs by mouth every 4 hours as needed for Wheezing or Shortness of Breath.    aspirin 81 MG EC tablet Take 1 tablet (81 mg) by mouth daily.    baclofen (LIORESAL) 10 MG tablet Take 2 tablets (20 mg) by mouth nightly.    Blood Glucose Monitoring Suppl (TRUE METRIX METER) w/Device KIT Use as directed    budesonide-formoterol (SYMBICORT) 160-4.5 MCG/ACT inhaler Inhale 2 puffs by mouth every 12 hours.    bumetanide (BUMEX) 1 MG tablet Take 1 tablet (1 mg) by mouth daily as needed (fluid/weight  gain). Do not take unless instructed by Transplant team.    Calcium Carb-Cholecalciferol 600-10 MG-MCG TABS Take 1 tablet by mouth 2 times daily.    Cetirizine HCl (ZERVIATE) 0.24 % SOLN Place 1 drop into both eyes 2 times daily.    clindamycin (CLEOCIN T) 1 % solution Apply 1 Application. topically 2 times daily. Apply to the red bumps on your face up to two times a day.    controlled substance agreement controlled substance agreement    diclofenac (VOLTAREN) 1 % gel Apply 2 g topically 4 times daily.    docusate sodium (COLACE) 100 MG capsule Take 1 capsule (100 mg) by mouth 2 times daily.    DULoxetine (CYMBALTA) 30 MG CR capsule Take 1 capsule (30 mg) by mouth daily.    famotidine (PEPCID) 20 MG tablet Take 1 tablet (20 mg) by mouth 2 times daily.    fluticasone propionate (FLONASE) 50 MCG/ACT nasal spray Spray 1 spray into each nostril 2 times daily.    gabapentin (NEURONTIN) 300 MG capsule Take 1 capsule (300 mg) by mouth every morning AND 1 capsule (300 mg) daily AND 2 capsules (600 mg) every evening.    hydroCHLOROthiazide (HYDRODIURIL) 25 MG tablet Take 1 tablet (25 mg) by mouth daily.    ketoconazole (NIZORAL) 2 % shampoo Use shampoo daily for dandruff    lidocaine (LIDOCAINE PAIN RELIEF) 4 % patch Apply 1 patch topically every 24 hours. Leave patch on for 12 hours, then remove for 12 hours.    lisinopril (PRINIVIL, ZESTRIL) 10 MG tablet Take 2 tablets (20 mg) by mouth daily.    magnesium oxide (MAG-OX) 400 MG tablet Take 1 tablet by mouth daily    melatonin (GNP MELATONIN MAXIMUM STRENGTH) 5 MG tablet Take 2 tablets (10 mg) by mouth at bedtime.    Multiple Vitamin (MULTIVITAMIN) TABS tablet Take 1 tablet by mouth daily.    naloxone (KLOXXADO) 8 mg/0.1 mL nasal spray Call 911! Tilt head and spray intranasally into one nostril as needed for respiratory depression. If patient does not respond or responds and then relapses, repeat using a new nasal spray every 3 minutes until emergency medical assistance  arrives.    NEEDLE, DISP, 25 G 25G X   1" MISC Use to inject testosterone    NIFEdipine (ADALAT CC) 30 MG Controlled-Release tablet Take 1 tablet (30 mg) by mouth nightly.    ondansetron (ZOFRAN) 8 MG tablet Take 1 tablet (8 mg) by mouth every 8 hours as needed for Nausea/Vomiting.    oxyCODONE (ROXICODONE) 10 MG tablet Take 1 tab every 4 hours as needed for moderate pain and 2 tabs every 4 hours as needed for severe pain. Max 10 tabs per day, 28 day supply    phenazopyridine (PYRIDIUM) 100 MG tablet Take 1 tablet (100 mg) by mouth 3 times daily.    polyethylene glycol (GLYCOLAX) 17 GM/SCOOP powder Mix 17 grams in 4-8 oz of liquide and drink by mouth daily as needed (Constipation).    pravastatin (PRAVACHOL) 40 MG tablet Take 1 tablet (40 mg) by mouth every evening.    senna (SENOKOT) 8.6 MG tablet Take 1 tablet (8.6 mg) by mouth daily.    sirolimus (RAPAMUNE) 1 MG tablet Take 2 tablets (2 mg) by mouth every morning.    SYRINGE-NEEDLE, DISP, 3 ML (B-D 3CC LUER-LOK SYR 25GX1") 25G X 1" 3 ML MISC Use as directed to inject testosterone    SYRINGE-NEEDLE, DISP, 3 ML 18G X 1-1/2" 3 ML MISC Use to draw up testosterone    tacrolimus (ENVARSUS XR) 1 MG tablet STOP TAKING since 11/08/21 - remaining on chart for dose adjustments, titratable med.    tacrolimus (ENVARSUS XR) 4 MG tablet Take 1 tablet (4 mg) by mouth every morning.    tamsulosin (FLOMAX) 0.4 MG capsule Take 1 capsule (0.4 mg) by mouth daily.    tamsulosin (FLOMAX) 0.4 MG capsule Take 1 capsule (0.4 mg) by mouth daily.    testosterone cypionate (DEPO-TESTOSTERONE) 200 MG/ML SOLN Inject 1 ml into the muscle every 14 days    traZODone (DESYREL) 50 MG tablet Take 1 tablet (50 mg) by mouth nightly.     Current Facility-Administered Medications   Medication    diphenhydrAMINE (BENADRYL) injection 50 mg    diphenhydrAMINE (BENADRYL) tablet 50 mg     Immunization History   Administered Date(s) Administered    COVID-19 (Moderna) Low Dose Red Cap >= 18 Years 04/08/2020     COVID-19 (Moderna) Red Cap >= 12 Years 05/16/2019, 06/15/2019, 10/14/2019    Hep-A/Hep-B; Twinrix, Adult 05/09/2020    Influenza Vaccine (High Dose) Quadrivalent >=65 Years 12/30/2019    Influenza Vaccine (Unspecified) 10/27/2016    Influenza Vaccine >=6 Months 01/09/2010, 01/09/2011, 03/06/2012, 12/02/2013, 11/15/2017, 12/01/2018    Pneumococcal 13 Vaccine (PREVNAR-13) 12/30/2019    Pneumococcal 23 Vaccine (PNEUMOVAX-23) 01/09/2013, 05/09/2020    Tdap 02/27/2011   Deferred Date(s) Deferred    Pneumococcal 23 Vaccine (PNEUMOVAX-23) 04/30/2019     Physical Exam:  BP 102/69 (BP Location: Right arm, BP Patient Position: Sitting, BP cuff size: Large)   Pulse 98   Temp 98.5 F (36.9 C) (Temporal)   Resp 16   Ht 5' 10" (1.778 m)   Wt 96.2 kg (212 lb)   SpO2 97%   BMI 30.42 kg/m      General Appearance: ***alert, no distress, pleasant affect, cooperative.  Heart:  JVD ***, PMI ***, normal rate and regular rhythm, no murmurs, clicks, or gallops. ***  Lungs: ***clear to auscultation and percussion. No rales, rhonchi, or wheezes noted. No chest deformities noted.  Abdomen: ***BS normal.  Abdomen soft, non-tender.  No masses or organomegaly.  Extremities:  ***no cyanosis, clubbing, or edema. Has 2+ peripheral pulses.        Lab Data:  Lab Results   Component Value Date    BUN 26 (H) 11/08/2021    CREAT 1.98 (H) 11/08/2021    CL 99 11/08/2021    NA 140 11/08/2021    K 4.4 11/08/2021    CA 9.2 11/08/2021    TBILI 0.47 11/08/2021    ALB 4.1 11/08/2021    TP 7.1 11/08/2021    AST 22 11/08/2021    ALK 76 11/08/2021    BICARB 29 11/08/2021    ALT 25 11/08/2021    GLU 126 (H) 11/08/2021     Lab Results   Component Value Date    WBC 7.9 11/08/2021    RBC 5.50 11/08/2021    HGB 15.2 11/08/2021    HCT 46.5 11/08/2021    MCV 84.5 11/08/2021    MCHC 32.7 11/08/2021    RDW 12.3 11/08/2021    PLT 162 11/08/2021    MPV 11.6 11/08/2021     Lab Results   Component Value Date    A1C 5.7 04/07/2021     Lab Results   Component Value Date     TSH 1.63 04/07/2021     Lab Results   Component Value Date    CHOL 105 04/07/2021    HDL 38 04/07/2021    LDLCALC 43 04/07/2021    TRIG 121 04/07/2021     Lab Results   Component Value Date    SIROT 11.5 11/08/2021     Lab Results   Component Value Date    FKTR 6.5 11/08/2021     No results found for: CSATR  Lab Results   Component Value Date    CMVPL Not Detected 01/27/2021     Lab Results   Component Value Date    DSA ABSENT 11/08/2021       Prior Cardiovascular Studies:   Lab Results   Component Value Date    LV Ejection Fraction 59 05/04/2021          Echo 05/04/21  Summary:   1. The left ventricular size is normal. The left ventricular systolic function is normal.   2. No left ventricular hypertrophy.   3. Normal pattern of left ventricular diastolic filling.   4. EF=59%.   5. Compared to prior study EF now 59%, was 69% 05/27/20.     LHC/IVUS 05/02/21  CONCLUSION:                                                                   1. Myocardial bridging with mild systolic compression of the mid segment    of the left anterior descending coronary artery.                              2. No angiographic evidence of coronary artery disease.                      3. Intimal thickness noted in LAD/LM up to 0.5 mm (Stable to slightly       worse compare to 2022).                                                         4. Non significant FFR at apical LAD.                                        5. Left ventricular end diastolic pressure appears normal.        Assessment summary:  66 year old male with end-stage HFrEF 2/2 NICM s/p OHT 04/17/19, history of 2R, HTN, HLD and anxiety coming in for f/u of heart transplant.    Assessment/Plan:  # Hematuria  # Dysuria  # Chronic pain  Assessment: We had a long frank discussion about patient's chronic pain issues and the heart transplant team's role in this. I discussed with him that when I initially agreed to cover his chronic opiate prescription, this was the assumption that he would  have a provider versed in chronic pain after 3-4 months, but we are at 6 months and has unable to find one. Additionally, I had not put him on a pain contract at that time, but he recently used more opiates without asking and I informed him this was not appropriate, but because he had not established guidelines I was not going to stop at this time. However, going forward until he can establish with a pain physician, we will set up a pain contract and he will need to follow through like a usual pain clinic with us with goal of provider in 3-4 months or I may start tapering. I will augment adjuvant agents additionally for now and we can continue to work on this.  Plan:  -pain contract signed  -urine tox monthly  -clinic follow up month  -oxycodone 10 mg tablets PO, 1 tab every 4 hours moderate pain, 2 tabs every 4 hours for severe pain, no more than 10 tablets a day, total 280 per 28 days.   -diclofenac cream for joint pain  -lidocaine patch for back pain  -trial of pyridium  -increase gaba at night  -siro change as below  -cymbalta as below    # End-stage heart failure s/p orthotopic heart transplant  # Chronic Immunosuppression/Immunomodulation  Assessment: While we thought continuing sirolimus would help prevent recurrent scar tissue from prostate procedure, it may be exacerbating factors now with delayed wound healing. Will try mmf for 1 month.  Plan:   - continue envarsus 6 mg daily, goal trough 4-8  - HOLD sirolimus 3 mg daily, goal trough 4-8 for at least 1 month  - start mmf 1000 mg bid for one month to allow healing  - Continue to monitor for renal toxicities, infection risk and malignancy risk  - continue pravastatin 40 mg daily  - continue aspirin 81 mg daily    # Hypertension  Assessment: controlled  Plan:  -continue lisinopril 20 mg daily  -resume hctz  -nifedipine 30 mg daily    # Dyslipidemia  -continue pravastatin 40 mg daily    # Depression  Assessment: improved mood  Plan:  -increase cymbalta to 120  mg daily     RTC in 1 month       Nicholas W Wettersten, MD  Advanced Heart Failure, Mechanical Circulatory Support, Transplant  Pgr: [phone]

## 2021-11-13 NOTE — Progress Notes (Signed)
Subjective:    Patient ID: Jason Rogers, male    DOB: 1955-06-09, 66 y.o.   MRN: [id number]  Chief Complaint  Patient presents with   Medical Management of Chronic Issues   PT presents to the office today for CPE and chronic follow up.  He is followed by Endocrinologists for every 4 months for DM.    He is followed by Ortho as needed.  Hypertension This is a chronic problem. The current episode started more than 1 year ago. The problem has been waxing and waning since onset. The problem is uncontrolled. Pertinent negatives include no blurred vision, malaise/fatigue, peripheral edema (improved) or shortness of breath. Risk factors for coronary artery disease include dyslipidemia, diabetes mellitus and male gender. The current treatment provides mild improvement. There is no history of CVA or heart failure.  Gastroesophageal Reflux He complains of belching, heartburn and a hoarse voice. This is a chronic problem. The current episode started more than 1 year ago. The problem occurs occasionally. The problem has been waxing and waning. The symptoms are aggravated by certain foods. He has tried a PPI for the symptoms. The treatment provided moderate relief.  Hyperlipidemia This is a chronic problem. The current episode started more than 1 year ago. The problem is controlled. Recent lipid tests were reviewed and are normal. Exacerbating diseases include obesity. Pertinent negatives include no shortness of breath. Current antihyperlipidemic treatment includes statins. The current treatment provides moderate improvement of lipids. Risk factors for coronary artery disease include dyslipidemia, diabetes mellitus, hypertension, male sex and a sedentary lifestyle.  Diabetes He presents for his follow-up diabetic visit. He has type 2 diabetes mellitus. Pertinent negatives for diabetes include no blurred vision and no foot paresthesias. There are no hypoglycemic complications. Symptoms are stable. Pertinent  negatives for diabetic complications include no CVA. Risk factors for coronary artery disease include dyslipidemia, diabetes mellitus, hypertension, male sex and sedentary lifestyle. He is following a generally healthy diet. His overall blood glucose range is 130-140 mg/dl. Eye exam is current.  Back Pain This is a chronic problem. The current episode started more than 1 year ago. The problem occurs intermittently. The pain is present in the lumbar spine. The quality of the pain is described as aching. The pain is at a severity of 6/10. The pain is mild.  Anemia Presents for follow-up visit. There has been no bruising/bleeding easily, malaise/fatigue or pica. There is no history of heart failure.      Review of Systems  Constitutional:  Negative for malaise/fatigue.  HENT:  Positive for hoarse voice.   Eyes:  Negative for blurred vision.  Respiratory:  Negative for shortness of breath.   Gastrointestinal:  Positive for heartburn.  Musculoskeletal:  Positive for back pain.  Hematological:  Does not bruise/bleed easily.  All other systems reviewed and are negative.  Family History  Problem Relation Age of Onset   Diabetes Mother    Heart disease Father    Social History   Socioeconomic History   Marital status: Married    Spouse name: [name]   Number of children: Not on file   Years of education: Not on file   Highest education level: Not on file  Occupational History    Comment: loads steel 3x per week  Tobacco Use   Smoking status: Never   Smokeless tobacco: Never  Vaping Use   Vaping Use: Never used  Substance and Sexual Activity   Alcohol use: Yes    Comment: rare  Drug use: No   Sexual activity: Not on file  Other Topics Concern   Not on file  Social History Narrative   Not on file   Social Determinants of Health   Financial Resource Strain: Low Risk  (09/23/2020)   Overall Financial Resource Strain (CARDIA)    Difficulty of Paying Living Expenses: Not very hard   Food Insecurity: No Food Insecurity (09/23/2020)   Hunger Vital Sign    Worried About Running Out of Food in the Last Year: Never true    Ran Out of Food in the Last Year: Never true  Transportation Needs: No Transportation Needs (09/23/2020)   PRAPARE - Hydrologist (Medical): No    Lack of Transportation (Non-Medical): No  Physical Activity: Sufficiently Active (09/23/2020)   Exercise Vital Sign    Days of Exercise per Week: 3 days    Minutes of Exercise per Session: 120 min  Stress: No Stress Concern Present (09/23/2020)   [place]    Feeling of Stress : Not at all  Social Connections: Moderately Isolated (09/23/2020)   Social Connection and Isolation Panel [NHANES]    Frequency of Communication with Friends and Family: More than three times a week    Frequency of Social Gatherings with Friends and Family: More than three times a week    Attends Religious Services: Never    Marine scientist or Organizations: No    Attends Archivist Meetings: Never    Marital Status: Married       Objective:   Physical Exam Vitals reviewed.  Constitutional:      General: He is not in acute distress.    Appearance: He is well-developed.  HENT:     Head: Normocephalic.     Right Ear: Tympanic membrane normal.     Left Ear: Tympanic membrane normal.  Eyes:     General:        Right eye: No discharge.        Left eye: No discharge.     Pupils: Pupils are equal, round, and reactive to light.  Neck:     Thyroid: No thyromegaly.  Cardiovascular:     Rate and Rhythm: Normal rate and regular rhythm.     Heart sounds: Normal heart sounds. No murmur heard. Pulmonary:     Effort: Pulmonary effort is normal. No respiratory distress.     Breath sounds: Normal breath sounds. No wheezing.  Abdominal:     General: Bowel sounds are normal. There is no distension.     Palpations: Abdomen is  soft.     Tenderness: There is no abdominal tenderness.  Musculoskeletal:        General: No tenderness. Normal range of motion.     Cervical back: Normal range of motion and neck supple.  Skin:    General: Skin is warm and dry.     Findings: No erythema or rash.  Neurological:     Mental Status: He is alert and oriented to person, place, and time.     Cranial Nerves: No cranial nerve deficit.     Deep Tendon Reflexes: Reflexes are normal and symmetric.  Psychiatric:        Behavior: Behavior normal.        Thought Content: Thought content normal.        Judgment: Judgment normal.         BP (!) 152/87   Pulse  68   Temp (!) 97.3 F (36.3 C) (Temporal)   Ht _0  (1.854 m)   Wt 225 lb 3.2 oz (102.2 kg)   SpO2 98%   BMI 29.71 kg/m   Assessment & Plan:  ARVIND MEXICANO comes in today with chief complaint of Medical Management of Chronic Issues   Diagnosis and orders addressed:  1. Type 2 diabetes mellitus with other specified complication, without long-term current use of insulin (HCC) - CMP14+EGFR - Bayer DCA Hb A1c Waived - Microalbumin / creatinine urine ratio  2. Hyperlipidemia associated with type 2 diabetes mellitus (HCC) - CMP14+EGFR - Lipid panel  3. Gastroesophageal reflux disease, unspecified whether esophagitis present - CMP14+EGFR  4. Hypertension associated with diabetes ([place]) - CMP14+EGFR  5. Overweight (BMI 25.0-29.9) - CMP14+EGFR  6. Iron deficiency - CMP14+EGFR - Anemia Profile B  7. Old tear of medial meniscus of right knee, unspecified tear type - CMP14+EGFR  8. Other spondylosis with radiculopathy, lumbar region - CMP14+EGFR - meloxicam (MOBIC) 15 MG tablet; Take 1 tablet (15 mg total) by mouth daily.  Dispense: 90 tablet; Refill: 1  9. Need for immunization against influenza - Flu Vaccine QUAD High Dose(Fluad) - CMP14+EGFR  10. Annual physical exam - TSH - PSA, total and free   Labs pending Health Maintenance reviewed Diet  and exercise encouraged  Follow up plan: 6 months    Evelina Dun, FNP

## 2021-11-13 NOTE — Telephone Encounter (Signed)
Patient awrae

## 2021-11-14 LAB — CMP14+EGFR
ALT: 19 IU/L (ref 0–44)
AST: 16 IU/L (ref 0–40)
Albumin/Globulin Ratio: 2.3 — ABNORMAL HIGH (ref 1.2–2.2)
Albumin: 4.3 g/dL (ref 3.9–4.9)
Alkaline Phosphatase: 49 IU/L (ref 44–121)
BUN/Creatinine Ratio: 15 (ref 10–24)
BUN: 13 mg/dL (ref 8–27)
Bilirubin Total: 1.3 mg/dL — ABNORMAL HIGH (ref 0.0–1.2)
CO2: 24 mmol/L (ref 20–29)
Calcium: 8.9 mg/dL (ref 8.6–10.2)
Chloride: 105 mmol/L (ref 96–106)
Creatinine, Ser: 0.85 mg/dL (ref 0.76–1.27)
Globulin, Total: 1.9 g/dL (ref 1.5–4.5)
Glucose: 122 mg/dL — ABNORMAL HIGH (ref 70–99)
Potassium: 4.2 mmol/L (ref 3.5–5.2)
Sodium: 143 mmol/L (ref 134–144)
Total Protein: 6.2 g/dL (ref 6.0–8.5)
eGFR: 95 mL/min/{1.73_m2} (ref >59)

## 2021-11-14 LAB — LIPID PANEL
Chol/HDL Ratio: 2.4 ratio (ref 0.0–5.0)
Cholesterol, Total: 85 mg/dL — ABNORMAL LOW (ref 100–199)
HDL: 35 mg/dL — ABNORMAL LOW (ref >39)
LDL Chol Calc (NIH): 29 mg/dL (ref 0–99)
Triglycerides: 117 mg/dL (ref 0–149)
VLDL Cholesterol Cal: 21 mg/dL (ref 5–40)

## 2021-11-14 LAB — PSA, TOTAL AND FREE
PSA, Free Pct: 26.8 %
PSA, Free: 0.59 ng/mL
Prostate Specific Ag, Serum: 2.2 ng/mL (ref 0.0–4.0)

## 2021-11-14 LAB — ANEMIA PROFILE B
Basophils Absolute: 0.1 10*3/uL (ref 0.0–0.2)
Basos: 1 %
EOS (ABSOLUTE): 0.1 10*3/uL (ref 0.0–0.4)
Eos: 2 %
Ferritin: 46 ng/mL (ref 30–400)
Folate: >20 ng/mL (ref >3.0)
Hematocrit: 47.3 % (ref 37.5–51.0)
Hemoglobin: 16 g/dL (ref 13.0–17.7)
Immature Grans (Abs): 0 10*3/uL (ref 0.0–0.1)
Immature Granulocytes: 0 %
Iron Saturation: 30 % (ref 15–55)
Iron: 96 ug/dL (ref 38–169)
Lymphocytes Absolute: 1.4 10*3/uL (ref 0.7–3.1)
Lymphs: 21 %
MCH: 29 pg (ref 26.6–33.0)
MCHC: 33.8 g/dL (ref 31.5–35.7)
MCV: 86 fL (ref 79–97)
Monocytes Absolute: 0.7 10*3/uL (ref 0.1–0.9)
Monocytes: 10 %
Neutrophils Absolute: 4.3 10*3/uL (ref 1.4–7.0)
Neutrophils: 66 %
Platelets: 172 10*3/uL (ref 150–450)
RBC: 5.52 x10E6/uL (ref 4.14–5.80)
RDW: 13.2 % (ref 11.6–15.4)
Retic Ct Pct: 1.4 % (ref 0.6–2.6)
Total Iron Binding Capacity: 320 ug/dL (ref 250–450)
UIBC: 224 ug/dL (ref 111–343)
Vitamin B-12: 529 pg/mL (ref 232–1245)
WBC: 6.5 10*3/uL (ref 3.4–10.8)

## 2021-11-14 LAB — TSH: TSH: 3.23 u[IU]/mL (ref 0.450–4.500)

## 2021-11-14 LAB — MICROALBUMIN / CREATININE URINE RATIO
Creatinine, Urine: 68.1 mg/dL
Microalb/Creat Ratio: 5 mg/g creat (ref 0–29)
Microalbumin, Urine: 3.5 ug/mL

## 2021-11-27 ENCOUNTER — Encounter: Payer: Self-pay | Admitting: Family

## 2021-11-27 ENCOUNTER — Ambulatory Visit (INDEPENDENT_AMBULATORY_CARE_PROVIDER_SITE_OTHER): Payer: Medicare HMO | Admitting: Family

## 2021-11-27 DIAGNOSIS — U071 COVID-19: Secondary | ICD-10-CM

## 2021-11-27 MED ORDER — BENZONATATE 200 MG PO CAPS: 200.0000 mg | ORAL_CAPSULE | Freq: Three times a day (TID) | ORAL | 1 refills | Status: DC | PRN

## 2021-11-27 MED ORDER — MOLNUPIRAVIR EUA 200MG CAPSULE: 4.0000 | ORAL_CAPSULE | Freq: Two times a day (BID) | ORAL | 0 refills | Status: AC

## 2021-11-27 NOTE — Progress Notes (Signed)
   Virtual Visit  Note Due to COVID-19 pandemic this visit was conducted virtually. This visit type was conducted due to national recommendations for restrictions regarding the COVID-19 Pandemic (e.g. social distancing, sheltering in place) in an effort to limit this patient's exposure and mitigate transmission in our community. All issues noted in this document were discussed and addressed.  A physical exam was not performed with this format.  I connected with Jason Rogers on 11/27/21 at 10:06 AM  by telephone and verified that I am speaking with the correct person using two identifiers. Jason Rogers is currently located at home and no one is currently with him  during visit. The provider, Evelina Dun, FNP is located in their office at time of visit.  I discussed the limitations, risks, security and privacy concerns of performing an evaluation and management service by telephone and the availability of in person appointments. I also discussed with the patient that there may be a patient responsible charge related to this service. The patient expressed understanding and agreed to proceed.   History and Present Illness:  PT calls the office today with COVID. States he started having headache, sinus congestion, and body aches on Saturday. Tested positive this morning.  URI  This is a new problem. The current episode started in the past 7 days. The problem has been gradually worsening. There has been no fever. Associated symptoms include congestion, coughing, headaches, rhinorrhea, sinus pain, sneezing and a sore throat. Pertinent negatives include no dysuria. He has tried decongestant for the symptoms. The treatment provided mild relief.     Review of Systems  HENT:  Positive for congestion, rhinorrhea, sinus pain, sneezing and sore throat.   Respiratory:  Positive for cough.   Genitourinary:  Negative for dysuria.  Neurological:  Positive for headaches.     Observations/Objective: No  SOB or distress noted, hoarse voice   Assessment and Plan: 1. Positive self-administered antigen test for COVID-19 COVID positive, rest, force fluids, tylenol as needed, Quarantine for at least 5 days and you are fever free, then must wear a mask out in public from day 5-05, report any worsening symptoms such as increased shortness of breath, swelling, or continued high fevers. Possible adverse effects discussed with antivirals.  - molnupiravir EUA (LAGEVRIO) 200 mg CAPS capsule; Take 4 capsules (800 mg total) by mouth 2 (two) times daily for 5 days.  Dispense: 40 capsule; Refill: 0 - benzonatate (TESSALON) 200 MG capsule; Take 1 capsule (200 mg total) by mouth 3 (three) times daily as needed.  Dispense: 30 capsule; Refill: 1  I discussed the assessment and treatment plan with the patient. The patient was provided an opportunity to ask questions and all were answered. The patient agreed with the plan and demonstrated an understanding of the instructions.   The patient was advised to call back or seek an in-person evaluation if the symptoms worsen or if the condition fails to improve as anticipated.  The above assessment and management plan was discussed with the patient. The patient verbalized understanding of and has agreed to the management plan. Patient is aware to call the clinic if symptoms persist or worsen. Patient is aware when to return to the clinic for a follow-up visit. Patient educated on when it is appropriate to go to the emergency department.   Time call ended:  10:18 AM   I provided 12 minutes of  non face-to-face time during this encounter.    Evelina Dun, FNP

## 2021-12-22 DIAGNOSIS — E119 Type 2 diabetes mellitus without complications: Secondary | ICD-10-CM | POA: Diagnosis not present

## 2021-12-22 DIAGNOSIS — Z0100 Encounter for examination of eyes and vision without abnormal findings: Secondary | ICD-10-CM | POA: Diagnosis not present

## 2021-12-22 DIAGNOSIS — H5203 Hypermetropia, bilateral: Secondary | ICD-10-CM | POA: Diagnosis not present

## 2021-12-22 LAB — HM DIABETES EYE EXAM

## 2022-01-23 ENCOUNTER — Other Ambulatory Visit: Payer: Self-pay | Admitting: Family

## 2022-01-23 DIAGNOSIS — E611 Iron deficiency: Secondary | ICD-10-CM

## 2022-01-29 DIAGNOSIS — E785 Hyperlipidemia, unspecified: Secondary | ICD-10-CM | POA: Diagnosis not present

## 2022-01-29 DIAGNOSIS — E1169 Type 2 diabetes mellitus with other specified complication: Secondary | ICD-10-CM | POA: Diagnosis not present

## 2022-02-05 ENCOUNTER — Ambulatory Visit: Payer: Medicare HMO

## 2022-02-05 DIAGNOSIS — I10 Essential (primary) hypertension: Secondary | ICD-10-CM | POA: Diagnosis not present

## 2022-02-05 DIAGNOSIS — E1159 Type 2 diabetes mellitus with other circulatory complications: Secondary | ICD-10-CM | POA: Diagnosis not present

## 2022-02-05 DIAGNOSIS — E1169 Type 2 diabetes mellitus with other specified complication: Secondary | ICD-10-CM | POA: Diagnosis not present

## 2022-02-05 DIAGNOSIS — E785 Hyperlipidemia, unspecified: Secondary | ICD-10-CM | POA: Diagnosis not present

## 2022-03-30 ENCOUNTER — Ambulatory Visit (INDEPENDENT_AMBULATORY_CARE_PROVIDER_SITE_OTHER): Payer: Medicare HMO

## 2022-03-30 VITALS — Ht 72.0 in | Wt 230.0 lb

## 2022-03-30 DIAGNOSIS — Z0000 Encounter for general adult medical examination without abnormal findings: Secondary | ICD-10-CM | POA: Diagnosis not present

## 2022-03-30 NOTE — Patient Instructions (Signed)
Jason Rogers , Thank you for taking time to come for your Medicare Wellness Visit. I appreciate your ongoing commitment to your health goals. Please review the following plan we discussed and let me know if I can assist you in the future.   These are the goals we discussed:  Goals      DIET - INCREASE WATER INTAKE     HEMOGLOBIN A1C < 7     Diabetic diet, regular daily exercise, 4 bottles of water daily, take medications regularly        This is a list of the screening recommended for you and due dates:  Health Maintenance  Topic Date Due   COVID-19 Vaccine (4 - 2023-24 season) 10/27/2021   Hemoglobin A1C  05/14/2022   Yearly kidney function blood test for diabetes  11/14/2022   Yearly kidney health urinalysis for diabetes  11/14/2022   Complete foot exam   11/14/2022   Eye exam for diabetics  12/23/2022   Medicare Annual Wellness Visit  03/31/2023   Colon Cancer Screening  12/30/2024   DTaP/Tdap/Td vaccine (3 - Td or Tdap) 11/14/2031   Pneumonia Vaccine  Completed   Flu Shot  Completed   Hepatitis C Screening: USPSTF Recommendation to screen - Ages 37-79 yo.  Completed   Zoster (Shingles) Vaccine  Completed   HPV Vaccine  Aged Out    Advanced directives: Advance directive discussed with you today. I have provided a copy for you to complete at home and have notarized. Once this is complete please bring a copy in to our office so we can scan it into your chart.   Conditions/risks identified: Aim for 30 minutes of exercise or brisk walking, 6-8 glasses of water, and 5 servings of fruits and vegetables each day.   Next appointment: Follow up in one year for your annual wellness visit.   Preventive Care 20 Years and Older, Male  Preventive care refers to lifestyle choices and visits with your health care provider that can promote health and wellness. What does preventive care include? A yearly physical exam. This is also called an annual well check. Dental exams once or twice a  year. Routine eye exams. Ask your health care provider how often you should have your eyes checked. Personal lifestyle choices, including: Daily care of your teeth and gums. Regular physical activity. Eating a healthy diet. Avoiding tobacco and drug use. Limiting alcohol use. Practicing safe sex. Taking low doses of aspirin every day. Taking vitamin and mineral supplements as recommended by your health care provider. What happens during an annual well check? The services and screenings done by your health care provider during your annual well check will depend on your age, overall health, lifestyle risk factors, and family history of disease. Counseling  Your health care provider may ask you questions about your: Alcohol use. Tobacco use. Drug use. Emotional well-being. Home and relationship well-being. Sexual activity. Eating habits. History of falls. Memory and ability to understand (cognition). Work and work Statistician. Screening  You may have the following tests or measurements: Height, weight, and BMI. Blood pressure. Lipid and cholesterol levels. These may be checked every 5 years, or more frequently if you are over 10 years old. Skin check. Lung cancer screening. You may have this screening every year starting at age 54 if you have a 30-pack-year history of smoking and currently smoke or have quit within the past 15 years. Fecal occult blood test (FOBT) of the stool. You may have this test every  year starting at age 71. Flexible sigmoidoscopy or colonoscopy. You may have a sigmoidoscopy every 5 years or a colonoscopy every 10 years starting at age 59. Prostate cancer screening. Recommendations will vary depending on your family history and other risks. Hepatitis C blood test. Hepatitis B blood test. Sexually transmitted disease (STD) testing. Diabetes screening. This is done by checking your blood sugar (glucose) after you have not eaten for a while (fasting). You may  have this done every 1-3 years. Abdominal aortic aneurysm (AAA) screening. You may need this if you are a current or former smoker. Osteoporosis. You may be screened starting at age 57 if you are at high risk. Talk with your health care provider about your test results, treatment options, and if necessary, the need for more tests. Vaccines  Your health care provider may recommend certain vaccines, such as: Influenza vaccine. This is recommended every year. Tetanus, diphtheria, and acellular pertussis (Tdap, Td) vaccine. You may need a Td booster every 10 years. Zoster vaccine. You may need this after age 47. Pneumococcal 13-valent conjugate (PCV13) vaccine. One dose is recommended after age 34. Pneumococcal polysaccharide (PPSV23) vaccine. One dose is recommended after age 3. Talk to your health care provider about which screenings and vaccines you need and how often you need them. This information is not intended to replace advice given to you by your health care provider. Make sure you discuss any questions you have with your health care provider. Document Released: 03/11/2015 Document Revised: 11/02/2015 Document Reviewed: 12/14/2014 Elsevier Interactive Patient Education  2017 [place] Prevention in the Home Falls can cause injuries. They can happen to people of all ages. There are many things you can do to make your home safe and to help prevent falls. What can I do on the outside of my home? Regularly fix the edges of walkways and driveways and fix any cracks. Remove anything that might make you trip as you walk through a door, such as a raised step or threshold. Trim any bushes or trees on the path to your home. Use bright outdoor lighting. Clear any walking paths of anything that might make someone trip, such as rocks or tools. Regularly check to see if handrails are loose or broken. Make sure that both sides of any steps have handrails. Any raised decks and porches  should have guardrails on the edges. Have any leaves, snow, or ice cleared regularly. Use sand or salt on walking paths during winter. Clean up any spills in your garage right away. This includes oil or grease spills. What can I do in the bathroom? Use night lights. Install grab bars by the toilet and in the tub and shower. Do not use towel bars as grab bars. Use non-skid mats or decals in the tub or shower. If you need to sit down in the shower, use a plastic, non-slip stool. Keep the floor dry. Clean up any water that spills on the floor as soon as it happens. Remove soap buildup in the tub or shower regularly. Attach bath mats securely with double-sided non-slip rug tape. Do not have throw rugs and other things on the floor that can make you trip. What can I do in the bedroom? Use night lights. Make sure that you have a light by your bed that is easy to reach. Do not use any sheets or blankets that are too big for your bed. They should not hang down onto the floor. Have a firm chair that has side  arms. You can use this for support while you get dressed. Do not have throw rugs and other things on the floor that can make you trip. What can I do in the kitchen? Clean up any spills right away. Avoid walking on wet floors. Keep items that you use a lot in easy-to-reach places. If you need to reach something above you, use a strong step stool that has a grab bar. Keep electrical cords out of the way. Do not use floor polish or wax that makes floors slippery. If you must use wax, use non-skid floor wax. Do not have throw rugs and other things on the floor that can make you trip. What can I do with my stairs? Do not leave any items on the stairs. Make sure that there are handrails on both sides of the stairs and use them. Fix handrails that are broken or loose. Make sure that handrails are as long as the stairways. Check any carpeting to make sure that it is firmly attached to the stairs.  Fix any carpet that is loose or worn. Avoid having throw rugs at the top or bottom of the stairs. If you do have throw rugs, attach them to the floor with carpet tape. Make sure that you have a light switch at the top of the stairs and the bottom of the stairs. If you do not have them, ask someone to add them for you. What else can I do to help prevent falls? Wear shoes that: Do not have high heels. Have rubber bottoms. Are comfortable and fit you well. Are closed at the toe. Do not wear sandals. If you use a stepladder: Make sure that it is fully opened. Do not climb a closed stepladder. Make sure that both sides of the stepladder are locked into place. Ask someone to hold it for you, if possible. Clearly mark and make sure that you can see: Any grab bars or handrails. First and last steps. Where the edge of each step is. Use tools that help you move around (mobility aids) if they are needed. These include: Canes. Walkers. Scooters. Crutches. Turn on the lights when you go into a dark area. Replace any light bulbs as soon as they burn out. Set up your furniture so you have a clear path. Avoid moving your furniture around. If any of your floors are uneven, fix them. If there are any pets around you, be aware of where they are. Review your medicines with your doctor. Some medicines can make you feel dizzy. This can increase your chance of falling. Ask your doctor what other things that you can do to help prevent falls. This information is not intended to replace advice given to you by your health care provider. Make sure you discuss any questions you have with your health care provider. Document Released: 12/09/2008 Document Revised: 07/21/2015 Document Reviewed: 03/19/2014 Elsevier Interactive Patient Education  2017 [employer].

## 2022-03-30 NOTE — Progress Notes (Signed)
Subjective:   Jason Rogers is a 67 y.o. male who presents for Medicare Annual/Subsequent preventive examination. I connected with  Jason Rogers on 03/30/22 by a audio enabled telemedicine application and verified that I am speaking with the correct person using two identifiers.  Patient Location: Home  Provider Location: Home Office  I discussed the limitations of evaluation and management by telemedicine. The patient expressed understanding and agreed to proceed.  Review of Systems     Cardiac Risk Factors include: advanced age (>46mn, >>53women);male gender;dyslipidemia;diabetes mellitus     Objective:    Today's Vitals   03/30/22 1155  Weight: 230 lb (104.3 kg)  Height: 6' (1.829 m)   Body mass index is 31.19 kg/m.     03/30/2022   11:58 AM 09/23/2020    2:54 PM 05/15/2019    9:01 AM 03/29/2017   10:47 PM  Advanced Directives  Does Patient Have a Medical Advance Directive? Yes No Yes No  Type of [place]  [place]   Copy of [place]? No - copy requested  No - copy requested   Would patient like information on creating a medical advance directive?  No - Patient declined      Current Medications (verified) Outpatient Encounter Medications as of 03/30/2022  Medication Sig   ascorbic acid (VITAMIN C) 500 MG tablet Take by mouth.   aspirin 81 MG tablet Take 81 mg by mouth daily.   benzonatate (TESSALON) 200 MG capsule Take 1 capsule (200 mg total) by mouth 3 (three) times daily as needed.   cholecalciferol (VITAMIN D3) 25 MCG (1000 UNIT) tablet Take 1,000 Units by mouth daily.   dapagliflozin propanediol (FARXIGA) 10 MG TABS tablet Take 1 tablet (10 mg total) by mouth daily before breakfast.   diclofenac Sodium (VOLTAREN) 1 % GEL Apply 2 g topically 4 (four) times daily.   Dulaglutide 3 MG/0.5ML SOPN Inject into the skin.   ferrous sulfate (FEROSUL) 325 (65  FE) MG tablet TAKE 1 TABLET BY MOUTH AT BEDTIME   Insulin Glargine (BASAGLAR KWIKPEN) 100 UNIT/ML Inject into the skin daily.   lisinopril (ZESTRIL) 20 MG tablet Take 1 tablet (20 mg total) by mouth daily.   loratadine (CLARITIN) 10 MG tablet Take 1 tablet (10 mg total) by mouth daily.   meloxicam (MOBIC) 15 MG tablet Take 1 tablet (15 mg total) by mouth daily.   metFORMIN (GLUCOPHAGE-XR) 500 MG 24 hr tablet Take 4 tablets (2,000 mg total) by mouth daily with breakfast. TAKE 4 TABLETS BY MOUTH ONCE DAILY WITH BREAKFAST   metoprolol succinate (TOPROL-XL) 25 MG 24 hr tablet Take 1 tablet by mouth once daily   Multiple Vitamin (MULTI-VITAMIN) tablet Take 1 tablet by mouth daily.   omeprazole (PRILOSEC) 20 MG capsule Take 1 capsule (20 mg total) by mouth daily.   rosuvastatin (CRESTOR) 20 MG tablet Take 1 tablet (20 mg total) by mouth at bedtime.   nitroGLYCERIN (NITROSTAT) 0.4 MG SL tablet Place 1 tablet (0.4 mg total) under the tongue every 5 (five) minutes as needed for chest pain.   No facility-administered encounter medications on file as of 03/30/2022.    Allergies (verified) Lipitor [atorvastatin]   History: Past Medical History:  Diagnosis Date   Allergy    Diabetes mellitus without complication ([place]    Hyperlipidemia    Hypertension    Past Surgical History:  Procedure Laterality Date   HERNIA REPAIR  TESTICLE SURGERY     TONSILLECTOMY AND ADENOIDECTOMY     VASECTOMY     Family History  Problem Relation Age of Onset   Diabetes Mother    Heart disease Father    Social History   Socioeconomic History   Marital status: Married    Spouse name: [name]   Number of children: Not on file   Years of education: Not on file   Highest education level: Not on file  Occupational History    Comment: loads steel 3x per week  Tobacco Use   Smoking status: Never   Smokeless tobacco: Never  Vaping Use   Vaping Use: Never used  Substance and Sexual Activity   Alcohol use: Yes     Comment: rare   Drug use: No   Sexual activity: Not on file  Other Topics Concern   Not on file  Social History Narrative   Not on file   Social Determinants of Health   Financial Resource Strain: Low Risk  (03/30/2022)   Overall Financial Resource Strain (CARDIA)    Difficulty of Paying Living Expenses: Not hard at all  Food Insecurity: No Food Insecurity (03/30/2022)   Hunger Vital Sign    Worried About Running Out of Food in the Last Year: Never true    [place] in the Last Year: Never true  Transportation Needs: No Transportation Needs (03/30/2022)   PRAPARE - Hydrologist (Medical): No    Lack of Transportation (Non-Medical): No  Physical Activity: Sufficiently Active (03/30/2022)   Exercise Vital Sign    Days of Exercise per Week: 7 days    Minutes of Exercise per Session: 30 min  Stress: No Stress Concern Present (03/30/2022)   [place]    Feeling of Stress : Not at all  Social Connections: Moderately Isolated (03/30/2022)   Social Connection and Isolation Panel [NHANES]    Frequency of Communication with Friends and Family: More than three times a week    Frequency of Social Gatherings with Friends and Family: More than three times a week    Attends Religious Services: Never    Marine scientist or Organizations: No    Attends Music therapist: Never    Marital Status: Married    Tobacco Counseling Counseling given: Not Answered   Clinical Intake:  Pre-visit preparation completed: Yes  Pain : No/denies pain     Nutritional Risks: None Diabetes: Yes CBG done?: No Did pt. bring in CBG monitor from home?: No  How often do you need to have someone help you when you read instructions, pamphlets, or other written materials from your doctor or pharmacy?: 1 - Never  Diabetic?yes  Nutrition Risk Assessment:  Has the patient had any N/V/D within  the last 2 months?  No  Does the patient have any non-healing wounds?  No  Has the patient had any unintentional weight loss or weight gain?  No   Diabetes:  Is the patient diabetic?  Yes  If diabetic, was a CBG obtained today?  No  Did the patient bring in their glucometer from home?  No  How often do you monitor your CBG's? 2 x day .   Financial Strains and Diabetes Management:  Are you having any financial strains with the device, your supplies or your medication? No .  Does the patient want to be seen by Chronic Care Management for management of  their diabetes?  No  Would the patient like to be referred to a Nutritionist or for Diabetic Management?  No   Diabetic Exams:  Diabetic Eye Exam: Completed 10/2022 Diabetic Foot Exam: Overdue, Pt has been advised about the importance in completing this exam. Pt is scheduled for diabetic foot exam on next office visit .   Interpreter Needed?: No  Information entered by :: Jadene Pierini, LPN   Activities of Daily Living    03/30/2022   11:58 AM  In your present state of health, do you have any difficulty performing the following activities:  Hearing? 0  Vision? 0  Difficulty concentrating or making decisions? 0  Walking or climbing stairs? 0  Dressing or bathing? 0  Doing errands, shopping? 0  Preparing Food and eating ? N  Using the Toilet? N  In the past six months, have you accidently leaked urine? N  Do you have problems with loss of bowel control? N  Managing your Medications? N  Managing your Finances? N  Housekeeping or managing your Housekeeping? N    Patient Care Team: Sharion Balloon, FNP as PCP - General (Nurse Practitioner) Chong Sicilian, A. Joneen Caraway, MD (Ophthalmology) Josue Hector, MD as Consulting Physician (Cardiology) Gabriel Carina Betsey Holiday, MD as Physician Assistant (Endocrinology) Harriett Sine, MD as Consulting Physician (Dermatology) Leta Baptist, MD as Consulting Physician (Otolaryngology) Orson Slick, MD as  Consulting Physician (Hematology and Oncology)  Indicate any recent Medical Services you may have received from other than [facility] providers in the past year (date may be approximate).     Assessment:   This is a routine wellness examination for Abdias.  Hearing/Vision screen Vision Screening - Comments:: Wears rx glasses - up to date with routine eye exams with  Dr.Patty   Dietary issues and exercise activities discussed: Current Exercise Habits: Home exercise routine, Type of exercise: walking, Time (Minutes): 30, Frequency (Times/Week): 7, Weekly Exercise (Minutes/Week): 210, Intensity: Mild, Exercise limited by: None identified   Goals Addressed             This Visit's Progress    DIET - INCREASE WATER INTAKE         Depression Screen    03/30/2022   11:57 AM 05/15/2021    8:07 AM 11/11/2020    8:23 AM 09/23/2020    2:55 PM 05/13/2020    8:14 AM 10/02/2019    8:23 AM 07/03/2019   12:05 PM  PHQ 2/9 Scores  PHQ - 2 Score 0 0 0 0 0 0 0  PHQ- 9 Score   0        Fall Risk    03/30/2022   11:56 AM 11/13/2021    8:00 AM 05/15/2021    8:07 AM 11/11/2020    8:23 AM 09/23/2020    2:55 PM  Fall Risk   Falls in the past year? 0 0 0 0 0  Number falls in past yr: 0    0  Injury with Fall? 0    0  Risk for fall due to : No Fall Risks    Impaired vision  Follow up Falls prevention discussed   Falls evaluation completed Falls prevention discussed    FALL RISK PREVENTION PERTAINING TO THE HOME:  Any stairs in or around the home? No  If so, are there any without handrails? No  Home free of loose throw rugs in walkways, pet beds, electrical cords, etc? Yes  Adequate lighting in your home to reduce risk of  falls? Yes   ASSISTIVE DEVICES UTILIZED TO PREVENT FALLS:  Life alert? No  Use of a cane, walker or w/c? No  Grab bars in the bathroom? No  Shower chair or bench in shower? No  Elevated toilet seat or a handicapped toilet? No          03/30/2022   11:58 AM 09/23/2020    2:52 PM   6CIT Screen  What Year? 0 points 0 points  What month? 0 points 0 points  What time? 0 points 0 points  Count back from 20 0 points 0 points  Months in reverse 0 points 0 points  Repeat phrase 0 points 0 points  Total Score 0 points 0 points    Immunizations Immunization History  Administered Date(s) Administered   Fluad Quad(high Dose 65+) 12/31/2019, 11/11/2020, 11/13/2021   Influenza,inj,Quad PF,6+ Mos 04/30/2014, 02/04/2015, 11/18/2015, 12/28/2016, 12/05/2017, 12/05/2018   Moderna SARS-COV2 Booster Vaccination 02/24/2020   Moderna Sars-Covid-2 Vaccination 05/01/2019, 06/02/2019, 10/19/2020   Pneumococcal Conjugate-13 04/30/2014, 04/03/2019   Pneumococcal Polysaccharide-23 05/13/2020   Tdap 10/28/2011, 11/13/2021   Zoster Recombinat (Shingrix) 10/10/2018, 05/15/2021   Zoster, Live 10/02/2019    TDAP status: Up to date  Flu Vaccine status: Up to date  Pneumococcal vaccine status: Up to date  Covid-19 vaccine status: Completed vaccines  Qualifies for Shingles Vaccine? Yes   Zostavax completed Yes   Shingrix Completed?: Yes  Screening Tests Health Maintenance  Topic Date Due   COVID-19 Vaccine (4 - 2023-24 season) 10/27/2021   HEMOGLOBIN A1C  05/14/2022   Diabetic kidney evaluation - eGFR measurement  11/14/2022   Diabetic kidney evaluation - Urine ACR  11/14/2022   FOOT EXAM  11/14/2022   OPHTHALMOLOGY EXAM  12/23/2022   Medicare Annual Wellness (AWV)  03/31/2023   COLONOSCOPY (Pts 45-56yr Insurance coverage will need to be confirmed)  12/30/2024   DTaP/Tdap/Td (3 - Td or Tdap) 11/14/2031   Pneumonia Vaccine 68 Years old  Completed   INFLUENZA VACCINE  Completed   Hepatitis C Screening  Completed   Zoster Vaccines- Shingrix  Completed   HPV VACCINES  Aged Out    Health Maintenance  Health Maintenance Due  Topic Date Due   COVID-19 Vaccine (4 - 2023-24 season) 10/27/2021    Colorectal cancer screening: Type of screening: Colonoscopy. Completed  01/10/2015. Repeat every 10 years  Lung Cancer Screening: (Low Dose CT Chest recommended if Age 68-80years, 30 pack-year currently smoking OR have quit w/in 15years.) does not qualify.   Lung Cancer Screening Referral: n/a  Additional Screening:  Hepatitis C Screening: does not qualify; Completed 12/28/2016  Vision Screening: Recommended annual ophthalmology exams for early detection of glaucoma and other disorders of the eye. Is the patient up to date with their annual eye exam?  Yes  Who is the provider or what is the name of the office in which the patient attends annual eye exams? Dr.Patty  If pt is not established with a provider, would they like to be referred to a provider to establish care? No .   Dental Screening: Recommended annual dental exams for proper oral hygiene  Community Resource Referral / Chronic Care Management: CRR required this visit?  No   CCM required this visit?  No      Plan:     I have personally reviewed and noted the following in the patient's chart:   Medical and social history Use of alcohol, tobacco or illicit drugs  Current medications and supplements including opioid prescriptions. Patient  is not currently taking opioid prescriptions. Functional ability and status Nutritional status Physical activity Advanced directives List of other physicians Hospitalizations, surgeries, and ER visits in previous 12 months Vitals Screenings to include cognitive, depression, and falls Referrals and appointments  In addition, I have reviewed and discussed with patient certain preventive protocols, quality metrics, and best practice recommendations. A written personalized care plan for preventive services as well as general preventive health recommendations were provided to patient.     Daphane Shepherd, LPN   03/04/9148   Nurse Notes: none

## 2022-04-06 ENCOUNTER — Ambulatory Visit: Payer: Medicare HMO | Admitting: Orthopaedic Surgery

## 2022-04-06 ENCOUNTER — Encounter: Payer: Self-pay | Admitting: Orthopaedic Surgery

## 2022-04-06 VITALS — BP 160/89 | HR 72 | Ht 72.0 in | Wt 230.0 lb

## 2022-04-06 DIAGNOSIS — M4726 Other spondylosis with radiculopathy, lumbar region: Secondary | ICD-10-CM | POA: Diagnosis not present

## 2022-04-06 DIAGNOSIS — M545 Low back pain, unspecified: Secondary | ICD-10-CM

## 2022-04-06 DIAGNOSIS — M48061 Spinal stenosis, lumbar region without neurogenic claudication: Secondary | ICD-10-CM | POA: Diagnosis not present

## 2022-04-06 DIAGNOSIS — L918 Other hypertrophic disorders of the skin: Secondary | ICD-10-CM | POA: Diagnosis not present

## 2022-04-06 DIAGNOSIS — D1801 Hemangioma of skin and subcutaneous tissue: Secondary | ICD-10-CM | POA: Diagnosis not present

## 2022-04-06 DIAGNOSIS — L570 Actinic keratosis: Secondary | ICD-10-CM | POA: Diagnosis not present

## 2022-04-06 DIAGNOSIS — G8929 Other chronic pain: Secondary | ICD-10-CM

## 2022-04-06 DIAGNOSIS — L814 Other melanin hyperpigmentation: Secondary | ICD-10-CM | POA: Diagnosis not present

## 2022-04-06 DIAGNOSIS — D692 Other nonthrombocytopenic purpura: Secondary | ICD-10-CM | POA: Diagnosis not present

## 2022-04-06 DIAGNOSIS — L821 Other seborrheic keratosis: Secondary | ICD-10-CM | POA: Diagnosis not present

## 2022-04-06 NOTE — Progress Notes (Signed)
   Office Visit Note   Patient: Jason Rogers           Date of Birth: 1955/08/11           MRN: [id number] Visit Date: 04/06/2022              Requested by: Sharion Balloon, [address] [place],  [place] [address] PCP: Sharion Balloon, FNP   Assessment & Plan: Visit Diagnoses: No diagnosis found.  Plan: ***  Follow-Up Instructions: No follow-ups on file.   Orders:  No orders of the defined types were placed in this encounter.  No orders of the defined types were placed in this encounter.     Procedures: No procedures performed   Clinical Data: No additional findings.   Subjective: Chief Complaint  Patient presents with   Lower Back - Pain   Right Leg - Pain    HPI  Review of Systems   Objective: Vital Signs: BP (!) 160/89   Pulse 72   Ht 6' (1.829 m)   Wt 230 lb (104.3 kg)   BMI 31.19 kg/m   Physical Exam  Ortho Exam  Specialty Comments:  No specialty comments available.  Imaging: ith associated osteophytic component, moderate facet degenerative changes with ligamentum flavum redundancy, right greater than left. Findings result in mild narrowing of the right subarticular zone, severe right and moderate left neural foraminal narrowing, mildly progressed from prior.   IMPRESSION: 1. Interval progression of disc degeneration at L5-S1 where there is severe right and moderate left neural foraminal narrowing. 2. Mild spinal canal stenosis and mild-to-moderate bilateral neural foraminal narrowing at L4-5. 3. Mild bilateral neural foraminal narrowing at L3-4 and on the left at L2-3.     Electronically Signed   By: Pedro Earls M.D.   On: 05/01/2021 15:47   PMFS History: Patient Active Problem List   Diagnosis Date Noted   Annual physical exam 11/13/2021   Other spondylosis with radiculopathy, lumbar region 05/31/2021   Quadriceps weakness 04/27/2021   Right knee meniscal tear 04/05/2021   Iron deficiency  10/02/2019   GERD (gastroesophageal reflux disease) 12/28/2016   Overweight (BMI 25.0-29.9) 11/18/2015   Hyperlipidemia associated with type 2 diabetes mellitus ([place]) 04/10/2013   Hypertension associated with diabetes ([place]) 04/10/2013   Diabetes mellitus, type 2 ([place]) 04/10/2013   Past Medical History:  Diagnosis Date   Allergy    Diabetes mellitus without complication ([place])    Hyperlipidemia    Hypertension     Family History  Problem Relation Age of Onset   Diabetes Mother    Heart disease Father     Past Surgical History:  Procedure Laterality Date   HERNIA REPAIR     TESTICLE SURGERY     TONSILLECTOMY AND ADENOIDECTOMY     VASECTOMY     Social History   Occupational History    Comment: loads steel 3x per week  Tobacco Use   Smoking status: Never   Smokeless tobacco: Never  Vaping Use   Vaping Use: Never used  Substance and Sexual Activity   Alcohol use: Yes    Comment: rare   Drug use: No   Sexual activity: Not on file

## 2022-04-07 DIAGNOSIS — M48061 Spinal stenosis, lumbar region without neurogenic claudication: Secondary | ICD-10-CM | POA: Insufficient documentation

## 2022-04-23 ENCOUNTER — Ambulatory Visit: Payer: Self-pay

## 2022-04-23 ENCOUNTER — Ambulatory Visit: Payer: Medicare HMO | Admitting: Physical Medicine and Rehabilitation

## 2022-04-23 VITALS — BP 154/83 | HR 73

## 2022-04-23 DIAGNOSIS — M5416 Radiculopathy, lumbar region: Secondary | ICD-10-CM

## 2022-04-23 MED ORDER — METHYLPREDNISOLONE ACETATE 80 MG/ML IJ SUSP
80.0000 mg | Freq: Once | INTRAMUSCULAR | Status: AC
  Administered 2022-04-23: 80 mg

## 2022-04-23 NOTE — Progress Notes (Signed)
Jason Rogers - 67 y.o. male MRN [id number]  Date of birth: Oct 30, 1955  Office Visit Note: Visit Date: 04/23/2022 PCP: Sharion Balloon, FNP Referred by: Sharion Balloon, FNP  Subjective: Chief Complaint  Patient presents with   Lower Back - Pain   HPI:  Jason Rogers is a 67 y.o. male who comes in today at the request of Dr. Rodell Perna for planned Right L5-S1 Lumbar Transforaminal epidural steroid injection with fluoroscopic guidance.  The patient has failed conservative care including home exercise, medications, time and activity modification.  This injection will be diagnostic and hopefully therapeutic.  Please see requesting physician notes for further details and justification. Prior injection was in 2017 and was a left L5 transforaminal epidural.   ROS Otherwise per HPI.  Assessment & Plan: Visit Diagnoses:    ICD-10-CM   1. Lumbar radiculopathy  M54.16 XR C-ARM NO REPORT    Epidural Steroid injection    methylPREDNISolone acetate (DEPO-MEDROL) injection 80 mg      Plan: No additional findings.   Meds & Orders:  Meds ordered this encounter  Medications   methylPREDNISolone acetate (DEPO-MEDROL) injection 80 mg    Orders Placed This Encounter  Procedures   XR C-ARM NO REPORT   Epidural Steroid injection    Follow-up: Return for visit to requesting provider as needed.   Procedures: No procedures performed  Lumbosacral Transforaminal Epidural Steroid Injection - Sub-Pedicular Approach with Fluoroscopic Guidance  Patient: Jason Rogers      Date of Birth: 04-27-1955 MRN: [id number] PCP: Sharion Balloon, FNP      Visit Date: 04/23/2022   Universal Protocol:    Date/Time: 04/23/2022  Consent Given By: the patient  Position: PRONE  Additional Comments: Vital signs were monitored before and after the procedure. Patient was prepped and draped in the usual sterile fashion. The correct patient, procedure, and site was verified.   Injection Procedure  Details:   Procedure diagnoses: Lumbar radiculopathy [M54.16]    Meds Administered:  Meds ordered this encounter  Medications   methylPREDNISolone acetate (DEPO-MEDROL) injection 80 mg    Laterality: Right  Location/Site: L5  Needle:5.0 in., 22 ga.  Short bevel or Quincke spinal needle  Needle Placement: Transforaminal  Findings:    -Comments: Excellent flow of contrast along the nerve, nerve root and into the epidural space.  Procedure Details: After squaring off the end-plates to get a true AP view, the C-arm was positioned so that an oblique view of the foramen as noted above was visualized. The target area is just inferior to the "nose of the scotty dog" or sub pedicular. The soft tissues overlying this structure were infiltrated with 2-3 ml. of 1% Lidocaine without Epinephrine.  The spinal needle was inserted toward the target using a "trajectory" view along the fluoroscope beam.  Under AP and lateral visualization, the needle was advanced so it did not puncture dura and was located close the 6 O'Clock position of the pedical in AP tracterory. Biplanar projections were used to confirm position. Aspiration was confirmed to be negative for CSF and/or blood. A 1-2 ml. volume of Isovue-250 was injected and flow of contrast was noted at each level. Radiographs were obtained for documentation purposes.   After attaining the desired flow of contrast documented above, a 0.5 to 1.0 ml test dose of 0.25% Marcaine was injected into each respective transforaminal space.  The patient was observed for 90 seconds post injection.  After no sensory deficits were reported, and  normal lower extremity motor function was noted,   the above injectate was administered so that equal amounts of the injectate were placed at each foramen (level) into the transforaminal epidural space.   Additional Comments:  No complications occurred Dressing: 2 x 2 sterile gauze and Band-Aid    Post-procedure  details: Patient was observed during the procedure. Post-procedure instructions were reviewed.  Patient left the clinic in stable condition.    Clinical History: No specialty comments available.     Objective:  VS:  HT:    WT:   BMI:     BP:(!) 154/83  HR:73bpm  TEMP: ( )  RESP:  Physical Exam Vitals and nursing note reviewed.  Constitutional:      General: He is not in acute distress.    Appearance: Normal appearance. He is not ill-appearing.  HENT:     Head: Normocephalic and atraumatic.     Right Ear: External ear normal.     Left Ear: External ear normal.     Nose: No congestion.  Eyes:     Extraocular Movements: Extraocular movements intact.  Cardiovascular:     Rate and Rhythm: Normal rate.     Pulses: Normal pulses.  Pulmonary:     Effort: Pulmonary effort is normal. No respiratory distress.  Abdominal:     General: There is no distension.     Palpations: Abdomen is soft.  Musculoskeletal:        General: No tenderness or signs of injury.     Cervical back: Neck supple.     Right lower leg: No edema.     Left lower leg: No edema.     Comments: Patient has good distal strength without clonus.  Skin:    Findings: No erythema or rash.  Neurological:     General: No focal deficit present.     Mental Status: He is alert and oriented to person, place, and time.     Sensory: No sensory deficit.     Motor: No weakness or abnormal muscle tone.     Coordination: Coordination normal.  Psychiatric:        Mood and Affect: Mood normal.        Behavior: Behavior normal.      Imaging: XR C-ARM NO REPORT  Result Date: 04/23/2022 Please see Notes tab for imaging impression.

## 2022-04-23 NOTE — Procedures (Signed)
Lumbosacral Transforaminal Epidural Steroid Injection - Sub-Pedicular Approach with Fluoroscopic Guidance  Patient: Jason Rogers      Date of Birth: 07-13-1955 MRN: [id number] PCP: Sharion Balloon, FNP      Visit Date: 04/23/2022   Universal Protocol:    Date/Time: 04/23/2022  Consent Given By: the patient  Position: PRONE  Additional Comments: Vital signs were monitored before and after the procedure. Patient was prepped and draped in the usual sterile fashion. The correct patient, procedure, and site was verified.   Injection Procedure Details:   Procedure diagnoses: Lumbar radiculopathy [M54.16]    Meds Administered:  Meds ordered this encounter  Medications   methylPREDNISolone acetate (DEPO-MEDROL) injection 80 mg    Laterality: Right  Location/Site: L5  Needle:5.0 in., 22 ga.  Short bevel or Quincke spinal needle  Needle Placement: Transforaminal  Findings:    -Comments: Excellent flow of contrast along the nerve, nerve root and into the epidural space.  Procedure Details: After squaring off the end-plates to get a true AP view, the C-arm was positioned so that an oblique view of the foramen as noted above was visualized. The target area is just inferior to the "nose of the scotty dog" or sub pedicular. The soft tissues overlying this structure were infiltrated with 2-3 ml. of 1% Lidocaine without Epinephrine.  The spinal needle was inserted toward the target using a "trajectory" view along the fluoroscope beam.  Under AP and lateral visualization, the needle was advanced so it did not puncture dura and was located close the 6 O'Clock position of the pedical in AP tracterory. Biplanar projections were used to confirm position. Aspiration was confirmed to be negative for CSF and/or blood. A 1-2 ml. volume of Isovue-250 was injected and flow of contrast was noted at each level. Radiographs were obtained for documentation purposes.   After attaining the desired  flow of contrast documented above, a 0.5 to 1.0 ml test dose of 0.25% Marcaine was injected into each respective transforaminal space.  The patient was observed for 90 seconds post injection.  After no sensory deficits were reported, and normal lower extremity motor function was noted,   the above injectate was administered so that equal amounts of the injectate were placed at each foramen (level) into the transforaminal epidural space.   Additional Comments:  No complications occurred Dressing: 2 x 2 sterile gauze and Band-Aid    Post-procedure details: Patient was observed during the procedure. Post-procedure instructions were reviewed.  Patient left the clinic in stable condition.

## 2022-04-23 NOTE — Progress Notes (Signed)
Functional Pain Scale - descriptive words and definitions  Moderate (4)   Constantly aware of pain, can complete ADLs with modification/sleep marginally affected at times/passive distraction is of no use, but active distraction gives some relief. Moderate range order  Average Pain 4   +Driver, -BT, -Dye Allergies.  Lower back pain on right side and radiates down the right leg to the shin. Walking and standing makes pain worse. Hard to lye on right hip

## 2022-04-23 NOTE — Patient Instructions (Signed)

## 2022-05-03 ENCOUNTER — Other Ambulatory Visit: Payer: Self-pay | Admitting: Family

## 2022-05-03 DIAGNOSIS — M4726 Other spondylosis with radiculopathy, lumbar region: Secondary | ICD-10-CM

## 2022-05-05 ENCOUNTER — Other Ambulatory Visit: Payer: Self-pay | Admitting: Family

## 2022-05-05 DIAGNOSIS — M4726 Other spondylosis with radiculopathy, lumbar region: Secondary | ICD-10-CM

## 2022-05-14 ENCOUNTER — Other Ambulatory Visit: Payer: Medicare HMO

## 2022-05-14 ENCOUNTER — Other Ambulatory Visit: Payer: Self-pay | Admitting: Family Medicine

## 2022-05-14 ENCOUNTER — Ambulatory Visit: Payer: Medicare HMO | Admitting: Family

## 2022-05-14 DIAGNOSIS — E1165 Type 2 diabetes mellitus with hyperglycemia: Secondary | ICD-10-CM

## 2022-05-14 LAB — BAYER DCA HB A1C WAIVED: HB A1C (BAYER DCA - WAIVED): 6.7 % — ABNORMAL HIGH (ref 4.8–5.6)

## 2022-05-15 LAB — LIPID PANEL
Chol/HDL Ratio: 2.5 ratio (ref 0.0–5.0)
Cholesterol, Total: 101 mg/dL (ref 100–199)
HDL: 41 mg/dL (ref >39)
LDL Chol Calc (NIH): 39 mg/dL (ref 0–99)
Triglycerides: 116 mg/dL (ref 0–149)
VLDL Cholesterol Cal: 21 mg/dL (ref 5–40)

## 2022-05-15 LAB — CBC WITH DIFFERENTIAL/PLATELET
Basophils Absolute: 0 10*3/uL (ref 0.0–0.2)
Basos: 1 %
EOS (ABSOLUTE): 0.1 10*3/uL (ref 0.0–0.4)
Eos: 2 %
Hematocrit: 47.7 % (ref 37.5–51.0)
Hemoglobin: 15.5 g/dL (ref 13.0–17.7)
Immature Grans (Abs): 0 10*3/uL (ref 0.0–0.1)
Immature Granulocytes: 0 %
Lymphocytes Absolute: 1.5 10*3/uL (ref 0.7–3.1)
Lymphs: 21 %
MCH: 27.8 pg (ref 26.6–33.0)
MCHC: 32.5 g/dL (ref 31.5–35.7)
MCV: 86 fL (ref 79–97)
Monocytes Absolute: 0.6 10*3/uL (ref 0.1–0.9)
Monocytes: 9 %
Neutrophils Absolute: 4.8 10*3/uL (ref 1.4–7.0)
Neutrophils: 67 %
Platelets: 160 10*3/uL (ref 150–450)
RBC: 5.58 x10E6/uL (ref 4.14–5.80)
RDW: 13.2 % (ref 11.6–15.4)
WBC: 7.1 10*3/uL (ref 3.4–10.8)

## 2022-05-15 LAB — CMP14+EGFR
ALT: 17 IU/L (ref 0–44)
AST: 18 IU/L (ref 0–40)
Albumin/Globulin Ratio: 2.2 (ref 1.2–2.2)
Albumin: 4.2 g/dL (ref 3.9–4.9)
Alkaline Phosphatase: 50 IU/L (ref 44–121)
BUN/Creatinine Ratio: 16 (ref 10–24)
BUN: 15 mg/dL (ref 8–27)
Bilirubin Total: 1 mg/dL (ref 0.0–1.2)
CO2: 24 mmol/L (ref 20–29)
Calcium: 8.9 mg/dL (ref 8.6–10.2)
Chloride: 105 mmol/L (ref 96–106)
Creatinine, Ser: 0.94 mg/dL (ref 0.76–1.27)
Globulin, Total: 1.9 g/dL (ref 1.5–4.5)
Glucose: 125 mg/dL — ABNORMAL HIGH (ref 70–99)
Potassium: 4.1 mmol/L (ref 3.5–5.2)
Sodium: 144 mmol/L (ref 134–144)
Total Protein: 6.1 g/dL (ref 6.0–8.5)
eGFR: 88 mL/min/{1.73_m2} (ref >59)

## 2022-05-21 ENCOUNTER — Ambulatory Visit: Payer: Medicare HMO | Admitting: Family

## 2022-05-21 ENCOUNTER — Other Ambulatory Visit: Payer: Self-pay | Admitting: Family

## 2022-05-21 DIAGNOSIS — K219 Gastro-esophageal reflux disease without esophagitis: Secondary | ICD-10-CM

## 2022-05-29 ENCOUNTER — Encounter: Payer: Self-pay | Admitting: Family

## 2022-05-29 ENCOUNTER — Ambulatory Visit (INDEPENDENT_AMBULATORY_CARE_PROVIDER_SITE_OTHER): Payer: Medicare HMO | Admitting: Family

## 2022-05-29 VITALS — BP 133/83 | HR 81 | Temp 97.4°F | Wt 222.0 lb

## 2022-05-29 DIAGNOSIS — E785 Hyperlipidemia, unspecified: Secondary | ICD-10-CM

## 2022-05-29 DIAGNOSIS — E611 Iron deficiency: Secondary | ICD-10-CM | POA: Diagnosis not present

## 2022-05-29 DIAGNOSIS — E663 Overweight: Secondary | ICD-10-CM | POA: Diagnosis not present

## 2022-05-29 DIAGNOSIS — K219 Gastro-esophageal reflux disease without esophagitis: Secondary | ICD-10-CM

## 2022-05-29 DIAGNOSIS — M4726 Other spondylosis with radiculopathy, lumbar region: Secondary | ICD-10-CM

## 2022-05-29 DIAGNOSIS — E1159 Type 2 diabetes mellitus with other circulatory complications: Secondary | ICD-10-CM | POA: Diagnosis not present

## 2022-05-29 DIAGNOSIS — M48061 Spinal stenosis, lumbar region without neurogenic claudication: Secondary | ICD-10-CM

## 2022-05-29 DIAGNOSIS — I152 Hypertension secondary to endocrine disorders: Secondary | ICD-10-CM

## 2022-05-29 DIAGNOSIS — Z6830 Body mass index (BMI) 30.0-30.9, adult: Secondary | ICD-10-CM

## 2022-05-29 DIAGNOSIS — E1169 Type 2 diabetes mellitus with other specified complication: Secondary | ICD-10-CM | POA: Diagnosis not present

## 2022-05-29 MED ORDER — ROSUVASTATIN CALCIUM 20 MG PO TABS: 20.0000 mg | ORAL_TABLET | Freq: Every day | ORAL | 3 refills | Status: DC

## 2022-05-29 MED ORDER — MELOXICAM 15 MG PO TABS: 15.0000 mg | ORAL_TABLET | Freq: Every day | ORAL | 0 refills | Status: DC

## 2022-05-29 MED ORDER — OMEPRAZOLE 20 MG PO CPDR: 20.0000 mg | DELAYED_RELEASE_CAPSULE | Freq: Every day | ORAL | 0 refills | Status: DC

## 2022-05-29 MED ORDER — METOPROLOL SUCCINATE ER 25 MG PO TB24: 25.0000 mg | ORAL_TABLET | Freq: Every day | ORAL | 2 refills | Status: DC

## 2022-05-29 MED ORDER — LISINOPRIL 20 MG PO TABS: 20.0000 mg | ORAL_TABLET | Freq: Every day | ORAL | 3 refills | Status: DC

## 2022-05-29 NOTE — Progress Notes (Signed)
Subjective:    Patient ID: Jason Rogers, male    DOB: October 12, 1955, 68 y.o.   MRN: [id number]  Chief Complaint  Patient presents with   Medical Management of Chronic Issues    PT presents to the office today for  chronic follow up.  He is followed by Endocrinologists for every 6 months for DM.    He is followed by Ortho as needed and getting steroid injections in back.  Hypertension This is a chronic problem. The current episode started more than 1 year ago. The problem has been resolved since onset. The problem is controlled. Pertinent negatives include no blurred vision, malaise/fatigue, peripheral edema or shortness of breath. Risk factors for coronary artery disease include dyslipidemia, diabetes mellitus, male gender and sedentary lifestyle. The current treatment provides moderate improvement.  Gastroesophageal Reflux He complains of belching and heartburn. This is a chronic problem. The current episode started more than 1 year ago. The problem occurs occasionally. Risk factors include obesity. He has tried a PPI for the symptoms. The treatment provided moderate relief.  Hyperlipidemia This is a chronic problem. The current episode started more than 1 year ago. The problem is controlled. Pertinent negatives include no shortness of breath. Current antihyperlipidemic treatment includes statins. The current treatment provides moderate improvement of lipids. Risk factors for coronary artery disease include dyslipidemia, diabetes mellitus, hypertension, male sex and a sedentary lifestyle.  Diabetes He presents for his follow-up diabetic visit. He has type 2 diabetes mellitus. Pertinent negatives for diabetes include no blurred vision and no foot paresthesias. Symptoms are stable. Risk factors for coronary artery disease include dyslipidemia, diabetes mellitus, hypertension and sedentary lifestyle. He is following a generally healthy diet. His overall blood glucose range is 130-140 mg/dl.   Anemia Presents for follow-up visit. There has been no malaise/fatigue.  Back Pain This is a chronic problem. The current episode started more than 1 year ago. The problem occurs intermittently. The problem has been waxing and waning since onset. The pain is present in the lumbar spine. The quality of the pain is described as aching. The pain is at a severity of 0/10 (after steroid injection). The pain is moderate.      Review of Systems  Constitutional:  Negative for malaise/fatigue.  Eyes:  Negative for blurred vision.  Respiratory:  Negative for shortness of breath.   Gastrointestinal:  Positive for heartburn.  Musculoskeletal:  Positive for back pain.  All other systems reviewed and are negative.      Objective:   Physical Exam Vitals reviewed.  Constitutional:      General: He is not in acute distress.    Appearance: He is well-developed.  HENT:     Head: Normocephalic.     Right Ear: Tympanic membrane normal.     Left Ear: Tympanic membrane normal.  Eyes:     General:        Right eye: No discharge.        Left eye: No discharge.     Pupils: Pupils are equal, round, and reactive to light.  Neck:     Thyroid: No thyromegaly.  Cardiovascular:     Rate and Rhythm: Normal rate and regular rhythm.     Heart sounds: Normal heart sounds. No murmur heard. Pulmonary:     Effort: Pulmonary effort is normal. No respiratory distress.     Breath sounds: Normal breath sounds. No wheezing.  Abdominal:     General: Bowel sounds are normal. There is no distension.  Palpations: Abdomen is soft.     Tenderness: There is no abdominal tenderness.  Musculoskeletal:        General: No tenderness. Normal range of motion.     Cervical back: Normal range of motion and neck supple.  Skin:    General: Skin is warm and dry.     Findings: No erythema or rash.  Neurological:     Mental Status: He is alert and oriented to person, place, and time.     Cranial Nerves: No cranial nerve  deficit.     Deep Tendon Reflexes: Reflexes are normal and symmetric.  Psychiatric:        Behavior: Behavior normal.        Thought Content: Thought content normal.        Judgment: Judgment normal.       BP 133/83   Pulse 81   Temp (!) 97.4 F (36.3 C) (Temporal)   Wt 222 lb (100.7 kg)   SpO2 96%   BMI 30.11 kg/m      Assessment & Plan:  Jason Rogers comes in today with chief complaint of Medical Management of Chronic Issues   Diagnosis and orders addressed:  1. Hyperlipidemia associated with type 2 diabetes mellitus  2. Hypertension associated with diabetes  3. Type 2 diabetes mellitus with other specified complication, without long-term current use of insulin  4. Overweight (BMI 25.0-29.9)  5. Gastroesophageal reflux disease, unspecified whether esophagitis present  6. Lumbar foraminal stenosis  7. Iron deficiency   Labs pending Health Maintenance reviewed Diet and exercise encouraged  Follow up plan: 6 months    Evelina Dun, FNP

## 2022-05-29 NOTE — Patient Instructions (Signed)
Health Maintenance After Age 68 After age 68, you are at a higher risk for certain long-term diseases and infections as well as injuries from falls. Falls are a major cause of broken bones and head injuries in people who are older than age 68. Getting regular preventive care can help to keep you healthy and well. Preventive care includes getting regular testing and making lifestyle changes as recommended by your health care provider. Talk with your health care provider about: Which screenings and tests you should have. A screening is a test that checks for a disease when you have no symptoms. A diet and exercise plan that is right for you. What should I know about screenings and tests to prevent falls? Screening and testing are the best ways to find a health problem early. Early diagnosis and treatment give you the best chance of managing medical conditions that are common after age 68. Certain conditions and lifestyle choices may make you more likely to have a fall. Your health care provider may recommend: Regular vision checks. Poor vision and conditions such as cataracts can make you more likely to have a fall. If you wear glasses, make sure to get your prescription updated if your vision changes. Medicine review. Work with your health care provider to regularly review all of the medicines you are taking, including over-the-counter medicines. Ask your health care provider about any side effects that may make you more likely to have a fall. Tell your health care provider if any medicines that you take make you feel dizzy or sleepy. Strength and balance checks. Your health care provider may recommend certain tests to check your strength and balance while standing, walking, or changing positions. Foot health exam. Foot pain and numbness, as well as not wearing proper footwear, can make you more likely to have a fall. Screenings, including: Osteoporosis screening. Osteoporosis is a condition that causes  the bones to get weaker and break more easily. Blood pressure screening. Blood pressure changes and medicines to control blood pressure can make you feel dizzy. Depression screening. You may be more likely to have a fall if you have a fear of falling, feel depressed, or feel unable to do activities that you used to do. Alcohol use screening. Using too much alcohol can affect your balance and may make you more likely to have a fall. Follow these instructions at home: Lifestyle Do not drink alcohol if: Your health care provider tells you not to drink. If you drink alcohol: Limit how much you have to: 0-1 drink a day for women. 0-2 drinks a day for men. Know how much alcohol is in your drink. In the [place] one drink equals one 12 oz bottle of beer (355 mL), one 5 oz glass of wine (148 mL), or one 1 oz glass of hard liquor (44 mL). Do not use any products that contain nicotine or tobacco. These products include cigarettes, chewing tobacco, and vaping devices, such as e-cigarettes. If you need help quitting, ask your health care provider. Activity  Follow a regular exercise program to stay fit. This will help you maintain your balance. Ask your health care provider what types of exercise are appropriate for you. If you need a cane or walker, use it as recommended by your health care provider. Wear supportive shoes that have nonskid soles. Safety  Remove any tripping hazards, such as rugs, cords, and clutter. Install safety equipment such as grab bars in bathrooms and safety rails on stairs. Keep rooms and walkways   well-lit. General instructions Talk with your health care provider about your risks for falling. Tell your health care provider if: You fall. Be sure to tell your health care provider about all falls, even ones that seem minor. You feel dizzy, tiredness (fatigue), or off-balance. Take over-the-counter and prescription medicines only as told by your health care provider. These include  supplements. Eat a healthy diet and maintain a healthy weight. A healthy diet includes low-fat dairy products, low-fat (lean) meats, and fiber from whole grains, beans, and lots of fruits and vegetables. Stay current with your vaccines. Schedule regular health, dental, and eye exams. Summary Having a healthy lifestyle and getting preventive care can help to protect your health and wellness after age 68. Screening and testing are the best way to find a health problem early and help you avoid having a fall. Early diagnosis and treatment give you the best chance for managing medical conditions that are more common for people who are older than age 68. Falls are a major cause of broken bones and head injuries in people who are older than age 68. Take precautions to prevent a fall at home. Work with your health care provider to learn what changes you can make to improve your health and wellness and to prevent falls. This information is not intended to replace advice given to you by your health care provider. Make sure you discuss any questions you have with your health care provider. Document Revised: 07/04/2020 Document Reviewed: 07/04/2020 Elsevier Patient Education  2023 Elsevier Inc.  

## 2022-07-03 ENCOUNTER — Encounter: Payer: Self-pay | Admitting: Family

## 2022-07-03 ENCOUNTER — Ambulatory Visit (INDEPENDENT_AMBULATORY_CARE_PROVIDER_SITE_OTHER): Payer: Medicare HMO | Admitting: Family

## 2022-07-03 VITALS — BP 130/83 | HR 87 | Temp 97.9°F | Ht 72.0 in | Wt 223.8 lb

## 2022-07-03 DIAGNOSIS — J019 Acute sinusitis, unspecified: Secondary | ICD-10-CM

## 2022-07-03 MED ORDER — DOXYCYCLINE HYCLATE 100 MG PO TABS: 100.0000 mg | ORAL_TABLET | Freq: Two times a day (BID) | ORAL | 0 refills | Status: DC

## 2022-07-03 NOTE — Progress Notes (Signed)
Subjective:    Patient ID: Jason Rogers, male    DOB: 1955-11-14, 67 y.o.   MRN: [id number]  Chief Complaint  Patient presents with   Cough    Bad cough for 2-3 weeks   Sinus Problem    Sinus congestion for 1 week   Pt presents to the office today with cough and congestion for over 2-3 weeks. Reports he was feeling better, but now feels worse.  Cough This is a new problem. The current episode started 1 to 4 weeks ago. The problem has been gradually worsening. The problem occurs every few minutes. The cough is Productive of sputum. Associated symptoms include headaches, nasal congestion, postnasal drip, shortness of breath and wheezing. Pertinent negatives include no chills, ear congestion, ear pain or fever.  Sinus Problem This is a new problem. The current episode started 1 to 4 weeks ago. The problem has been gradually worsening since onset. There has been no fever. His pain is at a severity of 6/10. Associated symptoms include coughing, headaches, shortness of breath and sinus pressure. Pertinent negatives include no chills or ear pain. Past treatments include oral decongestants and acetaminophen. The treatment provided mild relief.      Review of Systems  Constitutional:  Negative for chills and fever.  HENT:  Positive for postnasal drip and sinus pressure. Negative for ear pain.   Respiratory:  Positive for cough, shortness of breath and wheezing.   Neurological:  Positive for headaches.  All other systems reviewed and are negative.      Objective:   Physical Exam Vitals reviewed.  Constitutional:      General: He is not in acute distress.    Appearance: He is well-developed. He is obese.  HENT:     Head: Normocephalic.     Right Ear: Tympanic membrane normal.     Left Ear: Tympanic membrane normal.     Nose:     Right Sinus: Maxillary sinus tenderness and frontal sinus tenderness present.     Left Sinus: Maxillary sinus tenderness and frontal sinus tenderness  present.  Eyes:     General:        Right eye: No discharge.        Left eye: No discharge.     Pupils: Pupils are equal, round, and reactive to light.  Neck:     Thyroid: No thyromegaly.  Cardiovascular:     Rate and Rhythm: Normal rate and regular rhythm.     Heart sounds: Normal heart sounds. No murmur heard. Pulmonary:     Effort: Pulmonary effort is normal. No respiratory distress.     Breath sounds: Normal breath sounds. No wheezing.  Abdominal:     General: Bowel sounds are normal. There is no distension.     Palpations: Abdomen is soft.     Tenderness: There is no abdominal tenderness.  Musculoskeletal:        General: No tenderness. Normal range of motion.     Cervical back: Normal range of motion and neck supple.  Skin:    General: Skin is warm and dry.     Findings: No erythema or rash.  Neurological:     Mental Status: He is alert and oriented to person, place, and time.     Cranial Nerves: No cranial nerve deficit.     Deep Tendon Reflexes: Reflexes are normal and symmetric.  Psychiatric:        Behavior: Behavior normal.        Thought  Content: Thought content normal.        Judgment: Judgment normal.       BP 130/83   Pulse 87   Temp 97.9 F (36.6 C) (Temporal)   Ht 6' (1.829 m)   Wt 223 lb 12.8 oz (101.5 kg)   SpO2 94%   BMI 30.35 kg/m      Assessment & Plan:   [name] comes in today with chief complaint of Cough (Bad cough for 2-3 weeks) and Sinus Problem (Sinus congestion for 1 week)   Diagnosis and orders addressed:  1. Acute non-recurrent sinusitis, unspecified location - Take meds as prescribed - Use a cool mist humidifier  -Use saline nose sprays frequently -Force fluids -For any cough or congestion  Use plain Mucinex- regular strength or max strength is fine -For fever or aces or pains- take tylenol or ibuprofen. -Throat lozenges if help -Follow up if symptoms worsen or do not improve  - doxycycline (VIBRA-TABS) 100 MG  tablet; Take 1 tablet (100 mg total) by mouth 2 (two) times daily.  Dispense: 20 tablet; Refill: 0  Jannifer Rodney, FNP

## 2022-07-03 NOTE — Patient Instructions (Signed)

## 2022-07-15 ENCOUNTER — Other Ambulatory Visit: Payer: Self-pay | Admitting: Family

## 2022-07-15 DIAGNOSIS — K219 Gastro-esophageal reflux disease without esophagitis: Secondary | ICD-10-CM

## 2022-07-20 ENCOUNTER — Other Ambulatory Visit: Payer: Self-pay | Admitting: Family

## 2022-07-20 DIAGNOSIS — E611 Iron deficiency: Secondary | ICD-10-CM

## 2022-07-22 NOTE — Progress Notes (Unsigned)
CARDIOLOGY CONSULT NOTE       Patient ID: DMONTE GREENLY MRN: [id number] DOB/AGE: 04/29/1955 67 y.o.  Referring Physician: FNP Harlow Mares Primary Physician: Junie Spencer, FNP   HPI:  67 y.o. referred by FNP Harlow Mares for chest pain on 09/21/20  . History of DM-2, HLD and HTN. 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   Notes allergy ( depression ) with lipitor   His dad had one of the first CABG;s in [place] early 60's at [facility]  He is concerned about family history   He is originally from [employer] 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  Remarried with 4 kids in [place] and one stepson in [place]  Cardiac CT 10/10/20 calcium score 657 , 85 th percentile CADRADS 3 with 50-69% proximal RCA 40-59% proximal LAD Aortic root 40 mm FFR CT negative   Started on beta blocker and statin LDL 43 A1c 6.9   Sees Dr Ophelia Charter for right quad weakness and lumbar radiculopathy No work 8 weeks after office viist 05/31/21 and has been out since February   He has some atypical SSCP Mostly resting at night watching TV has taken nitro 3-5 times and sometimes helps but not always   Trip to [place] last year    Has been bothered by Sinusitis in May Rx with mucinex and doxycycline by FNP Jannifer Rodney Also got epidural with Dr Ophelia Charter for L5S1 foraminal stenosis on right   Discussed cath vs f/u perfusion study favor latter given moderate disease by CT in 2022   ROS All other systems reviewed and negative except as noted above  Past Medical History:  Diagnosis Date   Allergy    Diabetes mellitus  without complication (HCC)    Hyperlipidemia    Hypertension     Family History  Problem Relation Age of Onset   Diabetes Mother    Heart disease Father     Social History   Socioeconomic History   Marital status: Married    Spouse name: Kirt Boys   Number of children: Not on file   Years of education: Not on file   Highest education level: Not on file  Occupational History    Comment: loads steel 3x per week  Tobacco Use   Smoking status: Never   Smokeless tobacco: Never  Vaping Use   Vaping Use: Never used  Substance and Sexual Activity   Alcohol use: Yes    Comment: rare   Drug use: No   Sexual activity: Not on file  Other Topics Concern   Not on file  Social History Narrative   Not on file   Social Determinants of Health   Financial Resource Strain: Low Risk  (03/30/2022)   Overall Financial Resource Strain (CARDIA)    Difficulty of Paying Living Expenses: Not hard at all  Food Insecurity: No Food Insecurity (03/30/2022)   Hunger Vital Sign    Worried About Running Out of Food in the Last Year: Never true    Ran Out of Food in the Last Year: Never true  Transportation  Needs: No Transportation Needs (03/30/2022)   PRAPARE - Administrator, Civil Service (Medical): No    Lack of Transportation (Non-Medical): No  Physical Activity: Sufficiently Active (03/30/2022)   Exercise Vital Sign    Days of Exercise per Week: 7 days    Minutes of Exercise per Session: 30 min  Stress: No Stress Concern Present (03/30/2022)   [employer] of Occupational Health - Occupational Stress Questionnaire    Feeling of Stress : Not at all  Social Connections: Moderately Isolated (03/30/2022)   Social Connection and Isolation Panel [NHANES]    Frequency of Communication with Friends and Family: More than three times a week    Frequency of Social Gatherings with Friends and Family: More than three times a week    Attends Religious Services: Never    Database administrator or  Organizations: No    Attends Banker Meetings: Never    Marital Status: Married  Catering manager Violence: Not At Risk (03/30/2022)   Humiliation, Afraid, Rape, and Kick questionnaire    Fear of Current or Ex-Partner: No    Emotionally Abused: No    Physically Abused: No    Sexually Abused: No    Past Surgical History:  Procedure Laterality Date   HERNIA REPAIR     TESTICLE SURGERY     TONSILLECTOMY AND ADENOIDECTOMY     VASECTOMY        Current Outpatient Medications:    ascorbic acid (VITAMIN C) 500 MG tablet, Take by mouth., Disp: , Rfl:    aspirin 81 MG tablet, Take 81 mg by mouth daily., Disp: , Rfl:    cholecalciferol (VITAMIN D3) 25 MCG (1000 UNIT) tablet, Take 1,000 Units by mouth daily., Disp: , Rfl:    dapagliflozin propanediol (FARXIGA) 10 MG TABS tablet, Take 1 tablet (10 mg total) by mouth daily before breakfast., Disp: 90 tablet, Rfl: 3   diclofenac Sodium (VOLTAREN) 1 % GEL, Apply 2 g topically 4 (four) times daily., Disp: 350 g, Rfl: 2   doxycycline (VIBRA-TABS) 100 MG tablet, Take 1 tablet (100 mg total) by mouth 2 (two) times daily., Disp: 20 tablet, Rfl: 0   Dulaglutide 3 MG/0.5ML SOPN, Inject into the skin., Disp: , Rfl:    FEROSUL 325 (65 Fe) MG tablet, TAKE 1 TABLET BY MOUTH AT BEDTIME, Disp: 90 tablet, Rfl: 0   Insulin Glargine (BASAGLAR KWIKPEN) 100 UNIT/ML, Inject into the skin daily., Disp: , Rfl:    lisinopril (ZESTRIL) 20 MG tablet, Take 1 tablet (20 mg total) by mouth daily., Disp: 90 tablet, Rfl: 3   loratadine (CLARITIN) 10 MG tablet, Take 1 tablet (10 mg total) by mouth daily., Disp: 90 tablet, Rfl: 3   meloxicam (MOBIC) 15 MG tablet, Take 1 tablet (15 mg total) by mouth daily., Disp: 90 tablet, Rfl: 0   metFORMIN (GLUCOPHAGE-XR) 500 MG 24 hr tablet, Take 4 tablets (2,000 mg total) by mouth daily with breakfast. TAKE 4 TABLETS BY MOUTH ONCE DAILY WITH BREAKFAST, Disp: 360 tablet, Rfl: 1   metoprolol succinate (TOPROL-XL) 25 MG 24 hr tablet,  Take 1 tablet (25 mg total) by mouth daily., Disp: 90 tablet, Rfl: 2   Multiple Vitamin (MULTI-VITAMIN) tablet, Take 1 tablet by mouth daily., Disp: , Rfl:    nitroGLYCERIN (NITROSTAT) 0.4 MG SL tablet, Place 1 tablet (0.4 mg total) under the tongue every 5 (five) minutes as needed for chest pain., Disp: 90 tablet, Rfl: 3   omeprazole (PRILOSEC) 20 MG capsule,  Take 1 capsule (20 mg total) by mouth daily., Disp: 90 capsule, Rfl: 1   rosuvastatin (CRESTOR) 20 MG tablet, Take 1 tablet (20 mg total) by mouth at bedtime., Disp: 90 tablet, Rfl: 3    Physical Exam: Height 6' (1.829 m), weight 225 lb 3.2 oz (102.2 kg), SpO2 94 %.    Affect appropriate Healthy:  appears stated age HEENT: normal Neck supple with no adenopathy JVP normal no bruits no thyromegaly Lungs clear with no wheezing and good diaphragmatic motion Heart:  S1/S2 no murmur, no rub, gallop or click PMI normal Abdomen: benighn, BS positve, no tenderness, no AAA no bruit.  No HSM or HJR Distal pulses intact with no bruits No edema Neuro non-focal Some persistent soft tissue swelling left hand in splint  No muscular weakness   Labs:   Lab Results  Component Value Date   WBC 7.1 05/14/2022   HGB 15.5 05/14/2022   HCT 47.7 05/14/2022   MCV 86 05/14/2022   PLT 160 05/14/2022   No results for input(s): "NA", "K", "CL", "CO2", "BUN", "CREATININE", "CALCIUM", "PROT", "BILITOT", "ALKPHOS", "ALT", "AST", "GLUCOSE" in the last 168 hours.  Invalid input(s): "LABALBU" Lab Results  Component Value Date   TROPONINI <0.03 03/30/2017    Lab Results  Component Value Date   CHOL 101 05/14/2022   CHOL 85 (L) 11/13/2021   CHOL 89 (L) 05/13/2020   Lab Results  Component Value Date   HDL 41 05/14/2022   HDL 35 (L) 11/13/2021   HDL 46 05/13/2020   Lab Results  Component Value Date   LDLCALC 39 05/14/2022   LDLCALC 29 11/13/2021   LDLCALC 26 05/13/2020   Lab Results  Component Value Date   TRIG 116 05/14/2022   TRIG  117 11/13/2021   TRIG 83 05/13/2020   Lab Results  Component Value Date   CHOLHDL 2.5 05/14/2022   CHOLHDL 2.4 11/13/2021   CHOLHDL 1.9 05/13/2020   No results found for: "LDLDIRECT"    Radiology: No results found.  EKG: SR rate 89 normal    ASSESSMENT AND PLAN:   Chest pain: CRF;s DM, HTN, HLD pain a bit atypical and normal ECG Moderate RCA/LAD disease with high calcium score for age on CTA 10/10/20 Continue beta blocker , statin , ASA Has SL nitro Will order PET/CT scan at [facility] to further risk stratify given ongoing symptoms  HTN:  Well controlled.  Continue current medications and low sodium Dash type diet.   HLD:  on crestor LDL at goal 39 05/14/22  DM:  Discussed low carb diet.  Target hemoglobin A1c is 6.5 or less.  Continue current medications.A1c better 6.4   Ortho:  right quad weakness with abnormal EMG and L34 radiculopathy f/u Dr Ophelia Charter   PET / CT SL nitro refill  F/U in a year   Signed: Charlton Haws 07/30/2022, 2:24 PM

## 2022-07-29 IMAGING — MR MR LUMBAR SPINE W/O CM
4 of 8 series · 19 of 48 positions shown · non-contrast
Comparison: MRI of the lumbar spine September 19, 2014.

CLINICAL DATA: Quadriceps weakness UH2.Y4 (ENP-QM-CM). History of
right L3-4 protrusion. Type 2 diabetes mellitus with hyperglycemia,
without long-term current use of insulin (HCC) YWW.YM (ENP-QM-CM).

EXAM:
MRI LUMBAR SPINE WITHOUT CONTRAST
TECHNIQUE: Multiplanar, multisequence MR imaging of the lumbar spine was
performed. No intravenous contrast was administered.

[Series 6: T2 · sagittal · 4.0mm · 0.73mm/px · 4 of 18 slices shown (1 of 3)]
[im 1/18]
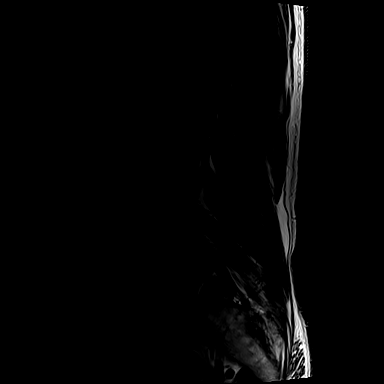
[im 6/18]
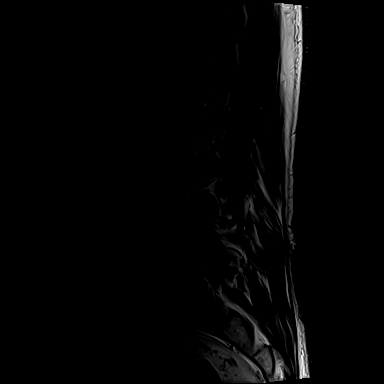
[im 12/18]
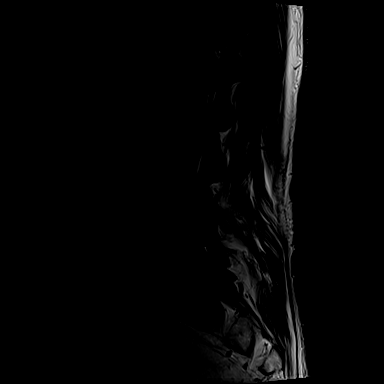
[im 18/18]
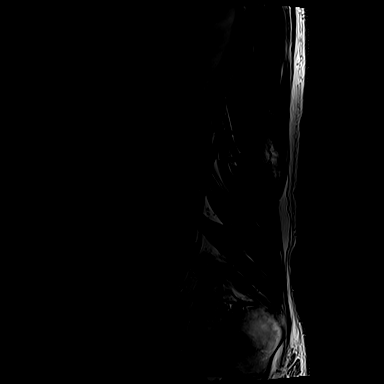

[Series 7: T1 · sagittal · 4.0mm · 0.73mm/px · 3 of 18 slices shown]
[im 1/18]
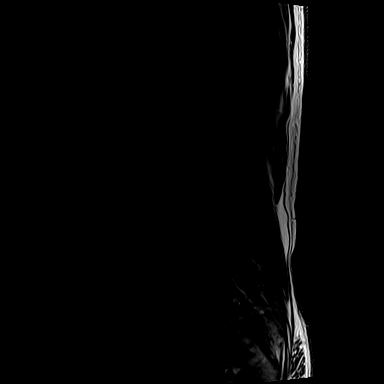
[im 12/18]
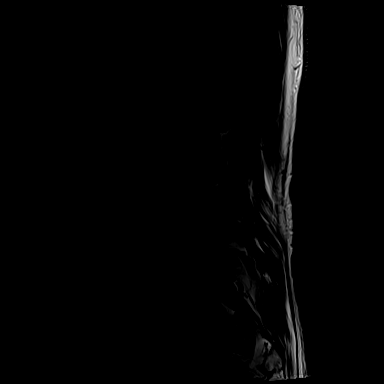
[im 18/18]
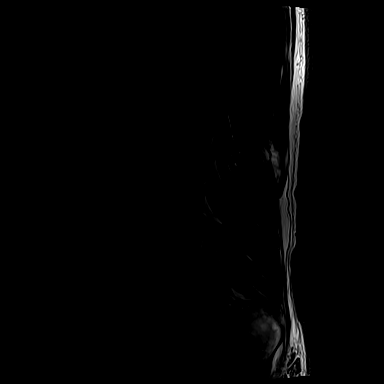

[Series 14: T2 · coronal · 5.0mm · 0.73mm/px · 3 of 13 slices shown (2 of 3)]
[im 1/13]
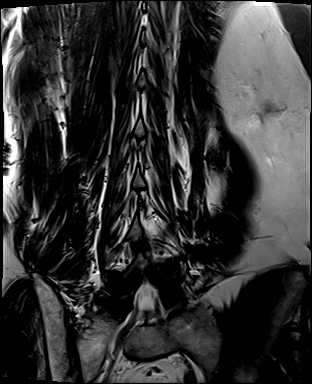
[im 7/13]
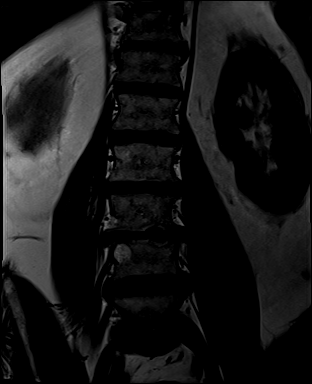
[im 13/13]
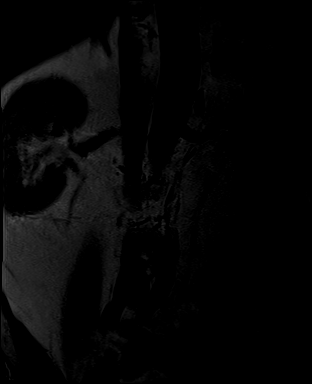

[Series 17: T2 · axial · 4.0mm · 0.28mm/px · z∈[-66,+140]mm · 9 of 44 slices shown (3 of 3)]
[im 1/44]
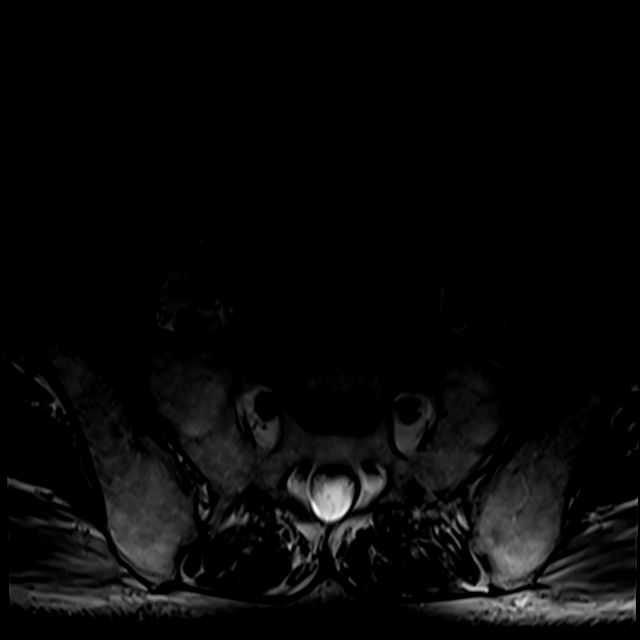
[im 5/44]
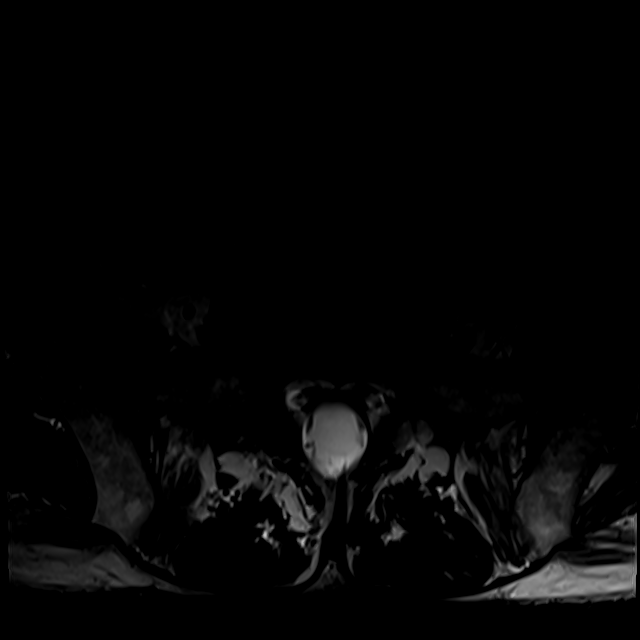
[im 9/44]
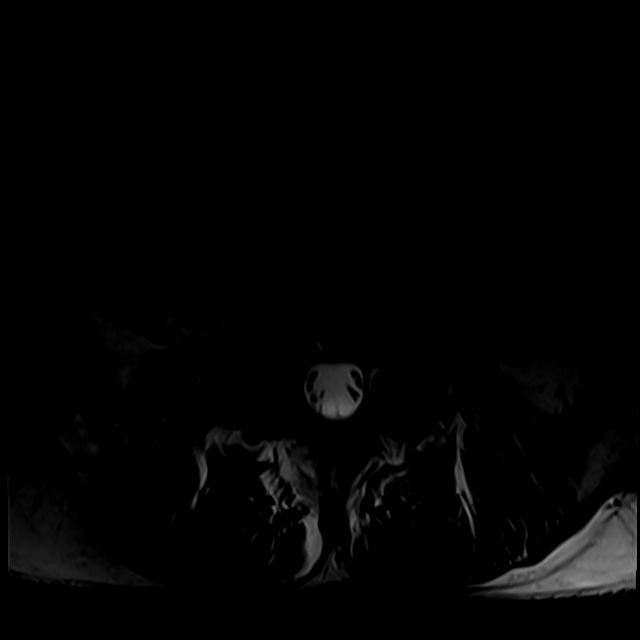
[im 13/44]
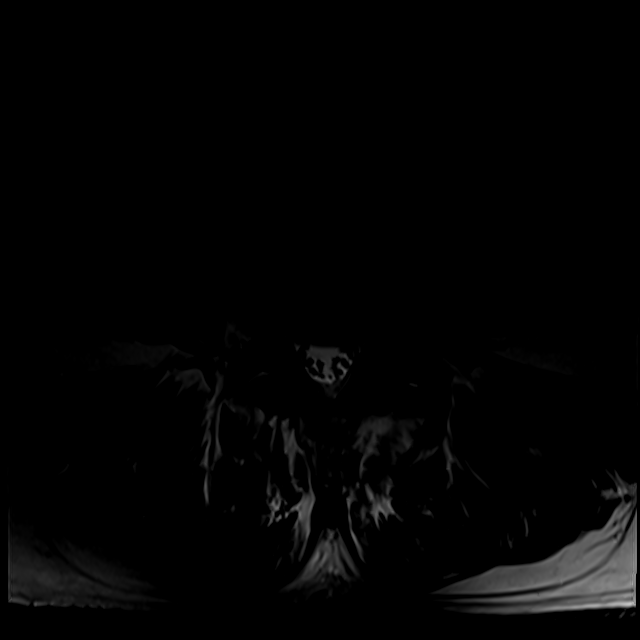
[im 18/44]
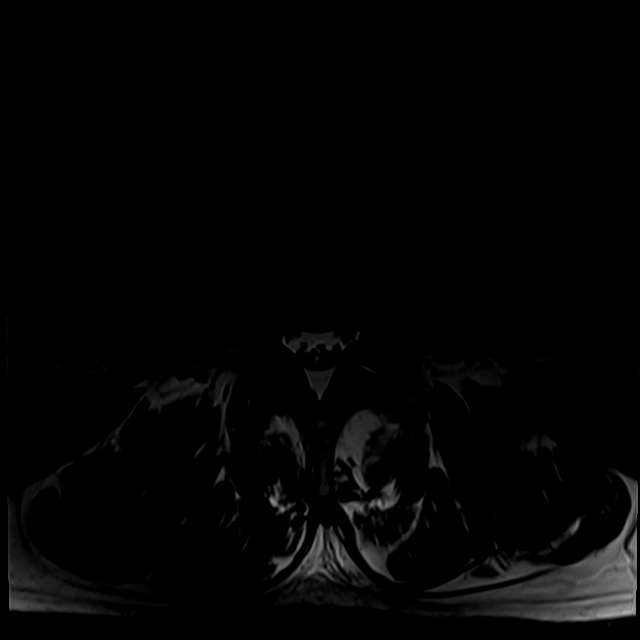
[im 22/44]
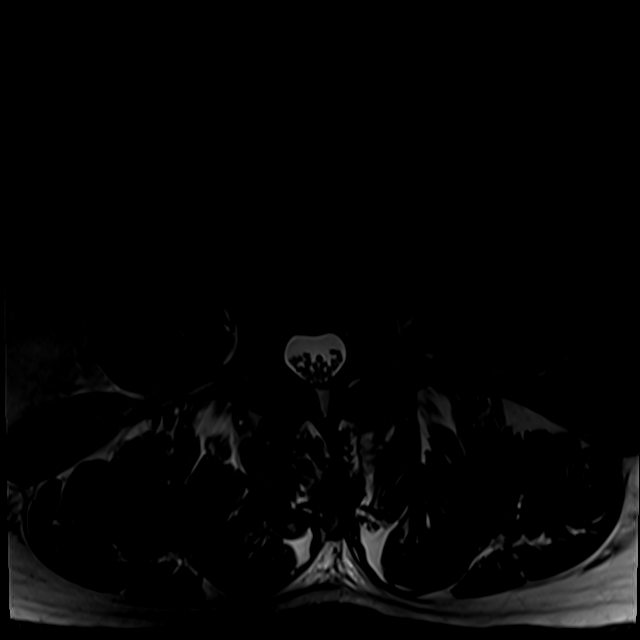
[im 26/44]
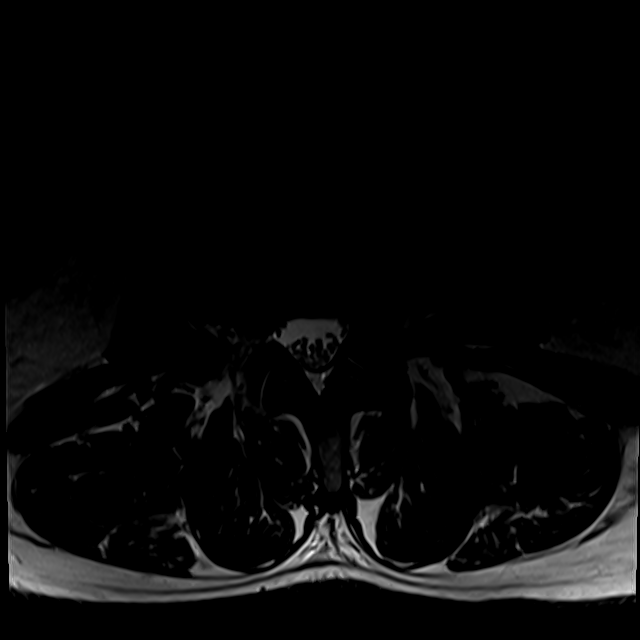
[im 31/44]
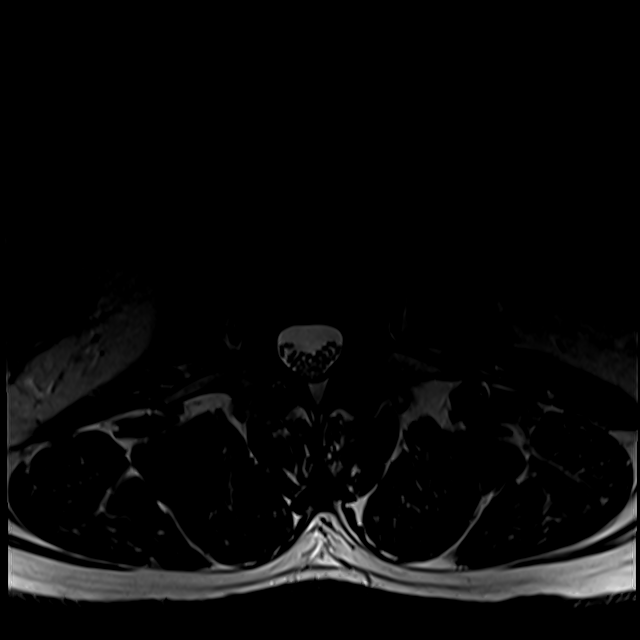
[im 39/44]
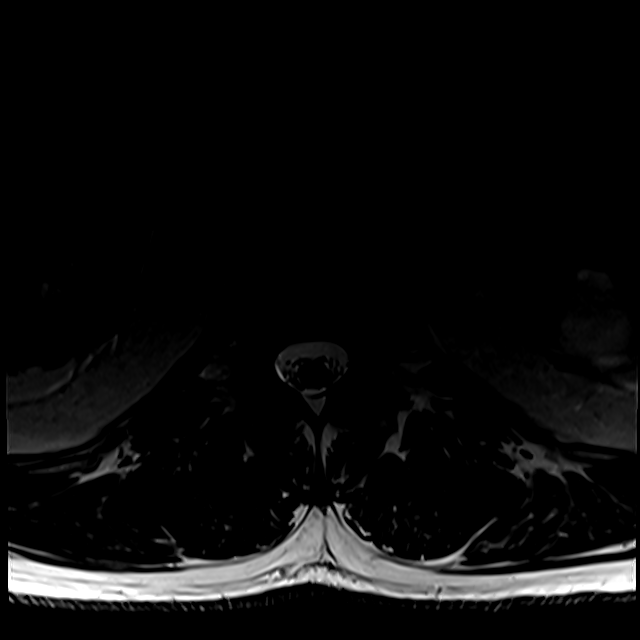

[19 of 48 positions shown; findings below may reference images not displayed]

FINDINGS: Segmentation:  Standard.

Alignment:  Trace anterolisthesis of L4 over L5.

Vertebrae: No fracture, evidence of discitis, or bone lesion.
Endplate degenerative changes at L3-4 and L5-S1, progressed since
prior MRI.

Conus medullaris and cauda equina: Conus extends to the L1-2 level.
Conus and cauda equina appear normal.

Paraspinal and other soft tissues: Left renal cyst.

Disc levels:

T12-L1: No spinal canal or neural foraminal.

L1-2: No spinal canal or neural foraminal stenosis.

L2-3: Shallow disc bulge with superimposed left foraminal disc
protrusion and mild facet degenerative changes. Mild left neural
foraminal narrowing. No significant spinal canal stenosis.

L3-4: Mild loss of disc height, disc bulge, moderate facet
degenerative changes and ligamentum flavum redundancy. Findings
result in mild spinal canal stenosis and mild bilateral neural
foraminal narrowing, mildly progressed since prior MRI.

L4-5: Disc bulge with superimposed central disc protrusion, moderate
facet degenerative changes, left greater than right, with ligamentum
flavum redundancy and right trace joint effusion. Findings result in
mild spinal canal stenosis with mild narrowing of the bilateral
subarticular zones and mild-to-moderate bilateral neural foraminal
narrowing findings have mildly progressed since prior MRI.

L5-S1: Progressive loss of disc height, right asymmetric disc bulge
with associated osteophytic component, moderate facet degenerative
changes with ligamentum flavum redundancy, right greater than left.
Findings result in mild narrowing of the right subarticular zone,
severe right and moderate left neural foraminal narrowing, mildly
progressed from prior.
IMPRESSION: 1. Interval progression of disc degeneration at L5-S1 where there is
severe right and moderate left neural foraminal narrowing.
2. Mild spinal canal stenosis and mild-to-moderate bilateral neural
foraminal narrowing at L4-5.
3. Mild bilateral neural foraminal narrowing at L3-4 and on the left
at L2-3.

## 2022-07-30 ENCOUNTER — Other Ambulatory Visit: Payer: Self-pay

## 2022-07-30 ENCOUNTER — Ambulatory Visit: Payer: Medicare HMO | Attending: Cardiovascular Disease | Admitting: Cardiovascular Disease

## 2022-07-30 ENCOUNTER — Encounter: Payer: Self-pay | Admitting: Cardiovascular Disease

## 2022-07-30 VITALS — BP 138/86 | HR 78 | Ht 72.0 in | Wt 225.2 lb

## 2022-07-30 DIAGNOSIS — I209 Angina pectoris, unspecified: Secondary | ICD-10-CM

## 2022-07-30 DIAGNOSIS — I10 Essential (primary) hypertension: Secondary | ICD-10-CM

## 2022-07-30 NOTE — Patient Instructions (Signed)
Medication Instructions:  Your physician recommends that you continue on your current medications as directed. Please refer to the Current Medication list given to you today.  *If you need a refill on your cardiac medications before your next appointment, please call your pharmacy*   Lab Work: NONE   If you have labs (blood work) drawn today and your tests are completely normal, you will receive your results only by: MyChart Message (if you have MyChart) OR A paper copy in the mail If you have any lab test that is abnormal or we need to change your treatment, we will call you to review the results.   Testing/Procedures: PET- CT Scan    Follow-Up: At [facility], you and your health needs are our priority.  As part of our continuing mission to provide you with exceptional heart care, we have created designated Provider Care Teams.  These Care Teams include your primary Cardiologist (physician) and Advanced Practice Providers (APPs -  Physician Assistants and Nurse Practitioners) who all work together to provide you with the care you need, when you need it.  We recommend signing up for the patient portal called "MyChart".  Sign up information is provided on this After Visit Summary.  MyChart is used to connect with patients for Virtual Visits (Telemedicine).  Patients are able to view lab/test results, encounter notes, upcoming appointments, etc.  Non-urgent messages can be sent to your provider as well.   To learn more about what you can do with MyChart, go to [email or web address].    Your next appointment:   6 month(s)  Provider:   Charlton Haws, MD    Other Instructions Thank you for choosing [facility]!

## 2022-08-06 DIAGNOSIS — E785 Hyperlipidemia, unspecified: Secondary | ICD-10-CM | POA: Diagnosis not present

## 2022-08-06 DIAGNOSIS — E1159 Type 2 diabetes mellitus with other circulatory complications: Secondary | ICD-10-CM | POA: Diagnosis not present

## 2022-08-06 DIAGNOSIS — E1169 Type 2 diabetes mellitus with other specified complication: Secondary | ICD-10-CM | POA: Diagnosis not present

## 2022-08-08 DIAGNOSIS — Z1211 Encounter for screening for malignant neoplasm of colon: Secondary | ICD-10-CM

## 2022-08-13 DIAGNOSIS — E785 Hyperlipidemia, unspecified: Secondary | ICD-10-CM | POA: Diagnosis not present

## 2022-08-13 DIAGNOSIS — E1169 Type 2 diabetes mellitus with other specified complication: Secondary | ICD-10-CM | POA: Diagnosis not present

## 2022-08-13 DIAGNOSIS — E1159 Type 2 diabetes mellitus with other circulatory complications: Secondary | ICD-10-CM | POA: Diagnosis not present

## 2022-08-13 DIAGNOSIS — I10 Essential (primary) hypertension: Secondary | ICD-10-CM | POA: Diagnosis not present

## 2022-09-13 ENCOUNTER — Encounter: Payer: Self-pay | Admitting: Orthopaedic Surgery

## 2022-09-13 ENCOUNTER — Ambulatory Visit: Payer: Medicare HMO | Admitting: Orthopaedic Surgery

## 2022-09-13 VITALS — Ht 72.0 in | Wt 225.0 lb

## 2022-09-13 DIAGNOSIS — M48061 Spinal stenosis, lumbar region without neurogenic claudication: Secondary | ICD-10-CM

## 2022-09-13 DIAGNOSIS — M4726 Other spondylosis with radiculopathy, lumbar region: Secondary | ICD-10-CM | POA: Diagnosis not present

## 2022-09-13 NOTE — Progress Notes (Signed)
Office Visit Note   Patient: Jason Rogers           Date of Birth: 1955-07-17           MRN: [id number] Visit Date: 09/13/2022              Requested by: Junie Spencer, FNP [address] [place],  [place] [address] PCP: Junie Spencer, FNP   Assessment & Plan: Visit Diagnoses:  1. Other spondylosis with radiculopathy, lumbar region   2. Lumbar foraminal stenosis     Plan: Patient can call after his cardiac workup is complete if he like to have a repeat epidural.  We can schedule this with Dr. Alvester Morin and he can follow-up to see me in a few months after that.  I reviewed MRI scan with patient and his wife again today and images and at this point no surgery is recommended for his lumbar spine.  Follow-Up Instructions: No follow-ups on file.   Orders:  No orders of the defined types were placed in this encounter.  No orders of the defined types were placed in this encounter.     Procedures: No procedures performed   Clinical Data: No additional findings.   Subjective: Chief Complaint  Patient presents with   Lower Back - Pain    HPI 67 year old male returns with ongoing problems with his back he states pain radiates down his right leg he got good relief for few months from his epidural injection 04/23/2022 with Dr. Alvester Morin.  He states he has more problems if he is standing and walking then has to stop and continue.  Once he is walking he states he can walk several blocks.  He has used intermittent ice.  He has had some problems with chest pain has hyperlipidemia and type 2 diabetes and has had cardiac workup currently PET scan of his heart is pending.  Review of Systems all systems updated noncontributory to HPI.   Objective: Vital Signs: Ht 6' (1.829 m)   Wt 225 lb (102.1 kg)   BMI 30.52 kg/m   Physical Exam Constitutional:      Appearance: He is well-developed.  HENT:     Head: Normocephalic and atraumatic.     Right Ear: External ear normal.      Left Ear: External ear normal.  Eyes:     Pupils: Pupils are equal, round, and reactive to light.  Neck:     Thyroid: No thyromegaly.     Trachea: No tracheal deviation.  Cardiovascular:     Rate and Rhythm: Normal rate.  Pulmonary:     Effort: Pulmonary effort is normal.  Abdominal:     General: Bowel sounds are normal.     Palpations: Abdomen is soft.  Musculoskeletal:     Cervical back: Neck supple.  Skin:    General: Skin is warm and dry.     Capillary Refill: Capillary refill takes less than 2 seconds.  Neurological:     Mental Status: He is alert and oriented to person, place, and time.  Psychiatric:        Behavior: Behavior normal.        Thought Content: Thought content normal.        Judgment: Judgment normal.     Ortho Exam negative logroll hips.  Knees reach full extension.  Good Strength ankle dorsiflexion plantarflexion is strong.  Specialty Comments:  No specialty comments available.  Imaging: No results found.   PMFS History: Patient Active  Problem List   Diagnosis Date Noted   Lumbar foraminal stenosis 04/07/2022   Other spondylosis with radiculopathy, lumbar region 05/31/2021   Quadriceps weakness 04/27/2021   Right knee meniscal tear 04/05/2021   Iron deficiency 10/02/2019   GERD (gastroesophageal reflux disease) 12/28/2016   Overweight (BMI 25.0-29.9) 11/18/2015   Hyperlipidemia associated with type 2 diabetes mellitus (HCC) 04/10/2013   Hypertension associated with diabetes (HCC) 04/10/2013   Diabetes mellitus, type 2 (HCC) 04/10/2013   Past Medical History:  Diagnosis Date   Allergy    Diabetes mellitus without complication (HCC)    Hyperlipidemia    Hypertension     Family History  Problem Relation Age of Onset   Diabetes Mother    Heart disease Father     Past Surgical History:  Procedure Laterality Date   HERNIA REPAIR     TESTICLE SURGERY     TONSILLECTOMY AND ADENOIDECTOMY     VASECTOMY     Social History   Occupational  History    Comment: loads steel 3x per week  Tobacco Use   Smoking status: Never   Smokeless tobacco: Never  Vaping Use   Vaping status: Never Used  Substance and Sexual Activity   Alcohol use: Yes    Comment: rare   Drug use: No   Sexual activity: Not on file

## 2022-09-17 ENCOUNTER — Telehealth: Payer: Self-pay | Admitting: Cardiovascular Disease

## 2022-09-17 DIAGNOSIS — I209 Angina pectoris, unspecified: Secondary | ICD-10-CM

## 2022-09-17 NOTE — Telephone Encounter (Signed)
Wife stated patient wants to get a cortisone shot for back and leg pain.  Wife stated patient's NM PET CT CARDIAC PERFUSION MULTI W/ABSOLUTE BLOODFLOW test is still pending and wants to know if patient can get this shot.

## 2022-09-17 NOTE — Telephone Encounter (Signed)
I have messaged Rockwell Alexandria, RN    Ok for cortisone Injections per [place] Dec  I spoke with wife and relayed message. They are also interested in going to [facility] this week for Pet [phone]

## 2022-09-18 ENCOUNTER — Telehealth (HOSPITAL_COMMUNITY): Payer: Self-pay | Admitting: Emergency Medicine

## 2022-09-18 ENCOUNTER — Encounter (HOSPITAL_COMMUNITY): Payer: Self-pay

## 2022-09-18 DIAGNOSIS — I2510 Atherosclerotic heart disease of native coronary artery without angina pectoris: Secondary | ICD-10-CM

## 2022-09-18 NOTE — Telephone Encounter (Signed)
Consent for cardiac PET stress test  Rockwell Alexandria RN Navigator Cardiac Imaging [facility] [phone] Office  [phone] Cell

## 2022-09-19 ENCOUNTER — Telehealth (HOSPITAL_COMMUNITY): Payer: Self-pay | Admitting: Emergency Medicine

## 2022-09-19 NOTE — Telephone Encounter (Signed)
Reaching out to patient to offer assistance regarding upcoming cardiac imaging study; pt verbalizes understanding of appt date/time, parking situation and where to check in, pre-test NPO status and medications ordered, and verified current allergies; name and call back number provided for further questions should they arise Sara Wallace RN Navigator Cardiac Imaging [facility] [phone] office [phone] cell 

## 2022-09-19 NOTE — Addendum Note (Signed)
Addended by: Riley Lam A on: 09/19/2022 11:14 AM   Modules accepted: Orders

## 2022-09-20 ENCOUNTER — Ambulatory Visit: Admission: RE | Admit: 2022-09-20 | Payer: Medicare HMO | Source: Ambulatory Visit

## 2022-10-02 ENCOUNTER — Encounter: Payer: Self-pay | Admitting: Family

## 2022-10-02 ENCOUNTER — Ambulatory Visit (INDEPENDENT_AMBULATORY_CARE_PROVIDER_SITE_OTHER): Payer: Medicare HMO | Admitting: Family

## 2022-10-02 VITALS — BP 153/89 | HR 74 | Temp 97.7°F | Ht 72.0 in | Wt 226.2 lb

## 2022-10-02 DIAGNOSIS — E1159 Type 2 diabetes mellitus with other circulatory complications: Secondary | ICD-10-CM | POA: Diagnosis not present

## 2022-10-02 DIAGNOSIS — I152 Hypertension secondary to endocrine disorders: Secondary | ICD-10-CM | POA: Diagnosis not present

## 2022-10-02 MED ORDER — LISINOPRIL 40 MG PO TABS
40.0000 mg | ORAL_TABLET | Freq: Every day | ORAL | 3 refills | Status: DC
Start: 2022-10-02 — End: 2023-11-12

## 2022-10-02 NOTE — Progress Notes (Signed)
   Subjective:    Patient ID: Jason Rogers, male    DOB: [birth year], 67 y.o.   MRN: [id number]  Chief Complaint  Patient presents with   Hypertension   PT presents to the office today with elevated BP. His home BP was 150/100. His BP is slightly elevated today, but on second check it was at goal.  Hypertension This is a chronic problem. The current episode started more than 1 year ago. The problem has been waxing and waning since onset. The problem is controlled. Associated symptoms include malaise/fatigue and peripheral edema (every once in awhile). Pertinent negatives include no shortness of breath. The current treatment provides moderate improvement.      Review of Systems  Constitutional:  Positive for malaise/fatigue.  Respiratory:  Negative for shortness of breath.   All other systems reviewed and are negative.      Objective:   Physical Exam Vitals reviewed.  Constitutional:      General: He is not in acute distress.    Appearance: He is well-developed.  HENT:     Head: Normocephalic.  Eyes:     General:        Right eye: No discharge.        Left eye: No discharge.     Pupils: Pupils are equal, round, and reactive to light.  Neck:     Thyroid: No thyromegaly.  Cardiovascular:     Rate and Rhythm: Normal rate and regular rhythm.     Heart sounds: Normal heart sounds. No murmur heard. Pulmonary:     Effort: Pulmonary effort is normal. No respiratory distress.     Breath sounds: Normal breath sounds. No wheezing.  Abdominal:     General: Bowel sounds are normal. There is no distension.     Palpations: Abdomen is soft.     Tenderness: There is no abdominal tenderness.  Musculoskeletal:        General: No tenderness. Normal range of motion.     Cervical back: Normal range of motion and neck supple.  Skin:    General: Skin is warm and dry.     Findings: No erythema or rash.  Neurological:     Mental Status: He is alert and oriented to person, place, and time.      Cranial Nerves: No cranial nerve deficit.     Deep Tendon Reflexes: Reflexes are normal and symmetric.  Psychiatric:        Behavior: Behavior normal.        Thought Content: Thought content normal.        Judgment: Judgment normal.       BP (!) 153/89   Pulse 74   Temp 97.7 F (36.5 C) (Temporal)   Ht 6' (1.829 m)   Wt 226 lb 3.2 oz (102.6 kg)   SpO2 94%   BMI 30.68 kg/m      Assessment & Plan:  RUSTIN MCGONIGLE comes in today with chief complaint of Hypertension   Diagnosis and orders addressed:  1. Hypertension associated with diabetes (HCC) Increase increase Lisinopril 40 mg from 20 mg  -Dash diet information given -Exercise encouraged - Stress Management  -Continue current meds -RTO in 1 month  - lisinopril (ZESTRIL) 40 MG tablet; Take 1 tablet (40 mg total) by mouth daily.  Dispense: 90 tablet; Refill: 3   Jannifer Rodney, FNP

## 2022-10-02 NOTE — Patient Instructions (Signed)
Hypertension, Adult High blood pressure (hypertension) is when the force of blood pumping through the arteries is too strong. The arteries are the blood vessels that carry blood from the heart throughout the body. Hypertension forces the heart to work harder to pump blood and may cause arteries to become narrow or stiff. Untreated or uncontrolled hypertension can lead to a heart attack, heart failure, a stroke, kidney disease, and other problems. A blood pressure reading consists of a higher number over a lower number. Ideally, your blood pressure should be below 120/80. The first ("top") number is called the systolic pressure. It is a measure of the pressure in your arteries as your heart beats. The second ("bottom") number is called the diastolic pressure. It is a measure of the pressure in your arteries as the heart relaxes. What are the causes? The exact cause of this condition is not known. There are some conditions that result in high blood pressure. What increases the risk? Certain factors may make you more likely to develop high blood pressure. Some of these risk factors are under your control, including: Smoking. Not getting enough exercise or physical activity. Being overweight. Having too much fat, sugar, calories, or salt (sodium) in your diet. Drinking too much alcohol. Other risk factors include: Having a personal history of heart disease, diabetes, high cholesterol, or kidney disease. Stress. Having a family history of high blood pressure and high cholesterol. Having obstructive sleep apnea. Age. The risk increases with age. What are the signs or symptoms? High blood pressure may not cause symptoms. Very high blood pressure (hypertensive crisis) may cause: Headache. Fast or irregular heartbeats (palpitations). Shortness of breath. Nosebleed. Nausea and vomiting. Vision changes. Severe chest pain, dizziness, and seizures. How is this diagnosed? This condition is diagnosed by  measuring your blood pressure while you are seated, with your arm resting on a flat surface, your legs uncrossed, and your feet flat on the floor. The cuff of the blood pressure monitor will be placed directly against the skin of your upper arm at the level of your heart. Blood pressure should be measured at least twice using the same arm. Certain conditions can cause a difference in blood pressure between your right and left arms. If you have a high blood pressure reading during one visit or you have normal blood pressure with other risk factors, you may be asked to: Return on a different day to have your blood pressure checked again. Monitor your blood pressure at home for 1 week or longer. If you are diagnosed with hypertension, you may have other blood or imaging tests to help your health care provider understand your overall risk for other conditions. How is this treated? This condition is treated by making healthy lifestyle changes, such as eating healthy foods, exercising more, and reducing your alcohol intake. You may be referred for counseling on a healthy diet and physical activity. Your health care provider may prescribe medicine if lifestyle changes are not enough to get your blood pressure under control and if: Your systolic blood pressure is above 130. Your diastolic blood pressure is above 80. Your personal target blood pressure may vary depending on your medical conditions, your age, and other factors. Follow these instructions at home: Eating and drinking  Eat a diet that is high in fiber and potassium, and low in sodium, added sugar, and fat. An example of this eating plan is called the DASH diet. DASH stands for Dietary Approaches to Stop Hypertension. To eat this way: Eat   plenty of fresh fruits and vegetables. Try to fill one half of your plate at each meal with fruits and vegetables. Eat whole grains, such as whole-wheat pasta, brown rice, or whole-grain bread. Fill about one  fourth of your plate with whole grains. Eat or drink low-fat dairy products, such as skim milk or low-fat yogurt. Avoid fatty cuts of meat, processed or cured meats, and poultry with skin. Fill about one fourth of your plate with lean proteins, such as fish, chicken without skin, beans, eggs, or tofu. Avoid pre-made and processed foods. These tend to be higher in sodium, added sugar, and fat. Reduce your daily sodium intake. Many people with hypertension should eat less than 1,500 mg of sodium a day. Do not drink alcohol if: Your health care provider tells you not to drink. You are pregnant, may be pregnant, or are planning to become pregnant. If you drink alcohol: Limit how much you have to: 0-1 drink a day for women. 0-2 drinks a day for men. Know how much alcohol is in your drink. In the [place] one drink equals one 12 oz bottle of beer (355 mL), one 5 oz glass of wine (148 mL), or one 1 oz glass of hard liquor (44 mL). Lifestyle  Work with your health care provider to maintain a healthy body weight or to lose weight. Ask what an ideal weight is for you. Get at least 30 minutes of exercise that causes your heart to beat faster (aerobic exercise) most days of the week. Activities may include walking, swimming, or biking. Include exercise to strengthen your muscles (resistance exercise), such as Pilates or lifting weights, as part of your weekly exercise routine. Try to do these types of exercises for 30 minutes at least 3 days a week. Do not use any products that contain nicotine or tobacco. These products include cigarettes, chewing tobacco, and vaping devices, such as e-cigarettes. If you need help quitting, ask your health care provider. Monitor your blood pressure at home as told by your health care provider. Keep all follow-up visits. This is important. Medicines Take over-the-counter and prescription medicines only as told by your health care provider. Follow directions carefully. Blood  pressure medicines must be taken as prescribed. Do not skip doses of blood pressure medicine. Doing this puts you at risk for problems and can make the medicine less effective. Ask your health care provider about side effects or reactions to medicines that you should watch for. Contact a health care provider if you: Think you are having a reaction to a medicine you are taking. Have headaches that keep coming back (recurring). Feel dizzy. Have swelling in your ankles. Have trouble with your vision. Get help right away if you: Develop a severe headache or confusion. Have unusual weakness or numbness. Feel faint. Have severe pain in your chest or abdomen. Vomit repeatedly. Have trouble breathing. These symptoms may be an emergency. Get help right away. Call 911. Do not wait to see if the symptoms will go away. Do not drive yourself to the hospital. Summary Hypertension is when the force of blood pumping through your arteries is too strong. If this condition is not controlled, it may put you at risk for serious complications. Your personal target blood pressure may vary depending on your medical conditions, your age, and other factors. For most people, a normal blood pressure is less than 120/80. Hypertension is treated with lifestyle changes, medicines, or a combination of both. Lifestyle changes include losing weight, eating a healthy,   low-sodium diet, exercising more, and limiting alcohol. This information is not intended to replace advice given to you by your health care provider. Make sure you discuss any questions you have with your health care provider. Document Revised: 12/20/2020 Document Reviewed: 12/20/2020 Elsevier Patient Education  2024 [employer]  

## 2022-10-11 DIAGNOSIS — D122 Benign neoplasm of ascending colon: Secondary | ICD-10-CM | POA: Diagnosis not present

## 2022-10-11 DIAGNOSIS — K5730 Diverticulosis of large intestine without perforation or abscess without bleeding: Secondary | ICD-10-CM | POA: Diagnosis not present

## 2022-10-11 DIAGNOSIS — K648 Other hemorrhoids: Secondary | ICD-10-CM | POA: Diagnosis not present

## 2022-10-11 DIAGNOSIS — Z09 Encounter for follow-up examination after completed treatment for conditions other than malignant neoplasm: Secondary | ICD-10-CM | POA: Diagnosis not present

## 2022-10-11 DIAGNOSIS — Z86010 Personal history of colonic polyps: Secondary | ICD-10-CM | POA: Diagnosis not present

## 2022-10-11 DIAGNOSIS — K635 Polyp of colon: Secondary | ICD-10-CM | POA: Diagnosis not present

## 2022-10-15 DIAGNOSIS — K635 Polyp of colon: Secondary | ICD-10-CM | POA: Diagnosis not present

## 2022-10-17 ENCOUNTER — Telehealth (HOSPITAL_COMMUNITY): Payer: Self-pay | Admitting: Emergency Medicine

## 2022-10-17 ENCOUNTER — Other Ambulatory Visit: Payer: Self-pay | Admitting: Family

## 2022-10-17 ENCOUNTER — Encounter (HOSPITAL_COMMUNITY): Payer: Self-pay

## 2022-10-17 DIAGNOSIS — E611 Iron deficiency: Secondary | ICD-10-CM

## 2022-10-17 NOTE — Telephone Encounter (Signed)
Reaching out to patient to offer assistance regarding upcoming cardiac imaging study; pt verbalizes understanding of appt date/time, parking situation and where to check in, pre-test NPO status and medications ordered, and verified current allergies; name and call back number provided for further questions should they arise Sara Wallace RN Navigator Cardiac Imaging [facility] [phone] office [phone] cell 

## 2022-10-18 ENCOUNTER — Ambulatory Visit
Admission: RE | Admit: 2022-10-18 | Discharge: 2022-10-18 | Disposition: A | Discharge status: Home / Self Care | Payer: Medicare HMO | Source: Ambulatory Visit | Attending: Cardiovascular Disease | Admitting: Cardiovascular Disease

## 2022-10-18 DIAGNOSIS — K760 Fatty (change of) liver, not elsewhere classified: Secondary | ICD-10-CM | POA: Diagnosis not present

## 2022-10-18 DIAGNOSIS — I700 Atherosclerosis of aorta: Secondary | ICD-10-CM | POA: Insufficient documentation

## 2022-10-18 DIAGNOSIS — I209 Angina pectoris, unspecified: Secondary | ICD-10-CM | POA: Diagnosis not present

## 2022-10-18 DIAGNOSIS — I25119 Atherosclerotic heart disease of native coronary artery with unspecified angina pectoris: Secondary | ICD-10-CM | POA: Insufficient documentation

## 2022-10-18 DIAGNOSIS — I2510 Atherosclerotic heart disease of native coronary artery without angina pectoris: Secondary | ICD-10-CM | POA: Diagnosis not present

## 2022-10-18 LAB — NM PET CT CARDIAC PERFUSION MULTI W/ABSOLUTE BLOODFLOW
MBFR: 2.89
Nuc Rest EF: 59 %
Nuc Stress EF: 61 %
Peak HR: 91 {beats}/min
Rest HR: 66 {beats}/min
Rest MBF: 0.92 ml/g/min
Rest Nuclear Isotope Dose: 25 mCi
SRS: 0
SSS: 0
ST Depression (mm): 0 mm
Stress MBF: 2.66 ml/g/min
Stress Nuclear Isotope Dose: 25 mCi
TID: 1.04

## 2022-10-18 MED ORDER — RUBIDIUM RB82 GENERATOR (RUBYFILL)
25.0000 | PACK | Freq: Once | INTRAVENOUS | Status: AC
  Administered 2022-10-18: 24.93 via INTRAVENOUS

## 2022-10-18 MED ORDER — RUBIDIUM RB82 GENERATOR (RUBYFILL)
25.0000 | PACK | Freq: Once | INTRAVENOUS | Status: AC
  Administered 2022-10-18: 25.01 via INTRAVENOUS

## 2022-10-18 MED ORDER — REGADENOSON 0.4 MG/5ML IV SOLN
0.4000 mg | Freq: Once | INTRAVENOUS | Status: AC
  Administered 2022-10-18: 0.4 mg via INTRAVENOUS
  Filled 2022-10-18: qty 5

## 2022-10-18 MED ORDER — RUBIDIUM RB82 GENERATOR (RUBYFILL)
25.0000 | PACK | Freq: Once | INTRAVENOUS | Status: AC
  Administered 2022-10-18: 24.96 via INTRAVENOUS

## 2022-10-18 MED ORDER — RUBIDIUM RB82 GENERATOR (RUBYFILL)
25.0000 | PACK | Freq: Once | INTRAVENOUS | Status: AC
  Administered 2022-10-18: 25.03 via INTRAVENOUS

## 2022-10-18 NOTE — Progress Notes (Signed)
Pt tolerated exam without incident; vital signs within normal limits; pt denies lightheadedness or dizziness; encouraged to drink caffeine, eat meal; pt ambulatory to lobby steady gait noted

## 2022-10-22 ENCOUNTER — Telehealth: Payer: Self-pay | Admitting: Cardiovascular Disease

## 2022-10-22 NOTE — Telephone Encounter (Signed)
Patient's wife is returning call in regards to results. Requesting call back.

## 2022-10-22 NOTE — Telephone Encounter (Signed)
Jason Stade, MD 10/19/2022  8:13 AM EDT     Coronary perfusion is normal does have CAD by calcium on CT but not flow limiting      Results discussed with wife, they will f/u with pcp

## 2022-10-23 ENCOUNTER — Other Ambulatory Visit: Payer: Medicare HMO

## 2022-10-23 DIAGNOSIS — I152 Hypertension secondary to endocrine disorders: Secondary | ICD-10-CM | POA: Diagnosis not present

## 2022-10-23 DIAGNOSIS — E1159 Type 2 diabetes mellitus with other circulatory complications: Secondary | ICD-10-CM

## 2022-10-23 LAB — CMP14+EGFR
ALT: 17 IU/L (ref 0–44)
AST: 19 IU/L (ref 0–40)
Albumin: 4.2 g/dL (ref 3.9–4.9)
Alkaline Phosphatase: 56 IU/L (ref 44–121)
BUN/Creatinine Ratio: 24 (ref 10–24)
BUN: 23 mg/dL (ref 8–27)
Bilirubin Total: 1.6 mg/dL — ABNORMAL HIGH (ref 0.0–1.2)
CO2: 24 mmol/L (ref 20–29)
Calcium: 9.4 mg/dL (ref 8.6–10.2)
Chloride: 104 mmol/L (ref 96–106)
Creatinine, Ser: 0.96 mg/dL (ref 0.76–1.27)
Globulin, Total: 2.2 g/dL (ref 1.5–4.5)
Glucose: 118 mg/dL — ABNORMAL HIGH (ref 70–99)
Potassium: 4.4 mmol/L (ref 3.5–5.2)
Sodium: 143 mmol/L (ref 134–144)
Total Protein: 6.4 g/dL (ref 6.0–8.5)
eGFR: 86 mL/min/{1.73_m2} (ref >59)

## 2022-11-07 ENCOUNTER — Other Ambulatory Visit: Payer: Self-pay | Admitting: Family

## 2022-11-07 DIAGNOSIS — M4726 Other spondylosis with radiculopathy, lumbar region: Secondary | ICD-10-CM

## 2022-11-30 ENCOUNTER — Ambulatory Visit: Payer: Medicare HMO | Admitting: Family

## 2022-11-30 ENCOUNTER — Encounter: Payer: Self-pay | Admitting: Family

## 2022-11-30 VITALS — BP 134/76 | HR 72 | Temp 98.2°F | Ht 72.0 in | Wt 227.4 lb

## 2022-11-30 DIAGNOSIS — M48061 Spinal stenosis, lumbar region without neurogenic claudication: Secondary | ICD-10-CM

## 2022-11-30 DIAGNOSIS — E611 Iron deficiency: Secondary | ICD-10-CM

## 2022-11-30 DIAGNOSIS — Z0000 Encounter for general adult medical examination without abnormal findings: Secondary | ICD-10-CM | POA: Diagnosis not present

## 2022-11-30 DIAGNOSIS — E1169 Type 2 diabetes mellitus with other specified complication: Secondary | ICD-10-CM

## 2022-11-30 DIAGNOSIS — E669 Obesity, unspecified: Secondary | ICD-10-CM

## 2022-11-30 DIAGNOSIS — K219 Gastro-esophageal reflux disease without esophagitis: Secondary | ICD-10-CM | POA: Diagnosis not present

## 2022-11-30 DIAGNOSIS — M4726 Other spondylosis with radiculopathy, lumbar region: Secondary | ICD-10-CM

## 2022-11-30 DIAGNOSIS — Z0001 Encounter for general adult medical examination with abnormal findings: Secondary | ICD-10-CM

## 2022-11-30 DIAGNOSIS — E785 Hyperlipidemia, unspecified: Secondary | ICD-10-CM | POA: Diagnosis not present

## 2022-11-30 DIAGNOSIS — I152 Hypertension secondary to endocrine disorders: Secondary | ICD-10-CM | POA: Diagnosis not present

## 2022-11-30 DIAGNOSIS — E1159 Type 2 diabetes mellitus with other circulatory complications: Secondary | ICD-10-CM

## 2022-11-30 LAB — BAYER DCA HB A1C WAIVED: HB A1C (BAYER DCA - WAIVED): 6.6 % — ABNORMAL HIGH (ref 4.8–5.6)

## 2022-11-30 NOTE — Progress Notes (Signed)
Subjective:    Patient ID: Jason Rogers, male    DOB: Apr 16, 1955, 67 y.o.   MRN: [id number]  Chief Complaint  Patient presents with   Medical Management of Chronic Issues   PT presents to the office today for CPE and chronic follow up.  He is followed by Endocrinologists for every 6 months for DM.   He is followed by Cardiologists.    He is followed by Ortho and dermatologists as needed.    Hypertension This is a chronic problem. The current episode started more than 1 year ago. The problem has been resolved since onset. The problem is controlled. Associated symptoms include malaise/fatigue. Pertinent negatives include no blurred vision, peripheral edema or shortness of breath. Risk factors for coronary artery disease include dyslipidemia, diabetes mellitus, obesity and sedentary lifestyle. The current treatment provides moderate improvement. There is no history of CVA or heart failure.  Gastroesophageal Reflux He complains of belching and heartburn. This is a chronic problem. The current episode started more than 1 year ago. The problem occurs rarely. Risk factors include obesity. He has tried a PPI for the symptoms. The treatment provided moderate relief.  Hyperlipidemia This is a chronic problem. The current episode started more than 1 year ago. The problem is controlled. Recent lipid tests were reviewed and are normal. Exacerbating diseases include obesity. Pertinent negatives include no shortness of breath. Current antihyperlipidemic treatment includes statins. The current treatment provides moderate improvement of lipids. Risk factors for coronary artery disease include dyslipidemia, diabetes mellitus, hypertension, a sedentary lifestyle and post-menopausal.  Diabetes He presents for his follow-up diabetic visit. He has type 2 diabetes mellitus. Pertinent negatives for diabetes include no blurred vision and no foot paresthesias. Symptoms are stable. Pertinent negatives for diabetic  complications include no CVA. Risk factors for coronary artery disease include diabetes mellitus, dyslipidemia, hypertension and sedentary lifestyle. He is following a generally healthy diet. His overall blood glucose range is 110-130 mg/dl. Eye exam is current.  Back Pain This is a chronic problem. The current episode started more than 1 year ago. The problem occurs intermittently. The problem has been waxing and waning since onset. The pain is present in the lumbar spine. The quality of the pain is described as aching. The pain is at a severity of 7/10. The pain is moderate. He has tried NSAIDs for the symptoms. The treatment provided moderate relief.  Anemia Presents for follow-up visit. Symptoms include malaise/fatigue. There is no history of heart failure.      Review of Systems  Constitutional:  Positive for malaise/fatigue.  Eyes:  Negative for blurred vision.  Respiratory:  Negative for shortness of breath.   Gastrointestinal:  Positive for heartburn.  Musculoskeletal:  Positive for back pain.  All other systems reviewed and are negative.   Family History  Problem Relation Age of Onset   Diabetes Mother    Heart disease Father    Social History   Socioeconomic History   Marital status: Married    Spouse name: Kirt Boys   Number of children: Not on file   Years of education: Not on file   Highest education level: Not on file  Occupational History    Comment: loads steel 3x per week  Tobacco Use   Smoking status: Never   Smokeless tobacco: Never  Vaping Use   Vaping status: Never Used  Substance and Sexual Activity   Alcohol use: Yes    Comment: rare   Drug use: No   Sexual activity: Not  on file  Other Topics Concern   Not on file  Social History Narrative   Not on file   Social Determinants of Health   Financial Resource Strain: Low Risk  (03/30/2022)   Overall Financial Resource Strain (CARDIA)    Difficulty of Paying Living Expenses: Not hard at all  Food  Insecurity: No Food Insecurity (03/30/2022)   Hunger Vital Sign    Worried About Running Out of Food in the Last Year: Never true    Ran Out of Food in the Last Year: Never true  Transportation Needs: No Transportation Needs (03/30/2022)   PRAPARE - Administrator, Civil Service (Medical): No    Lack of Transportation (Non-Medical): No  Physical Activity: Sufficiently Active (03/30/2022)   Exercise Vital Sign    Days of Exercise per Week: 7 days    Minutes of Exercise per Session: 30 min  Stress: No Stress Concern Present (03/30/2022)   [employer] of Occupational Health - Occupational Stress Questionnaire    Feeling of Stress : Not at all  Social Connections: Moderately Isolated (03/30/2022)   Social Connection and Isolation Panel [NHANES]    Frequency of Communication with Friends and Family: More than three times a week    Frequency of Social Gatherings with Friends and Family: More than three times a week    Attends Religious Services: Never    Database administrator or Organizations: No    Attends Banker Meetings: Never    Marital Status: Married       Objective:   Physical Exam Vitals reviewed.  Constitutional:      General: He is not in acute distress.    Appearance: He is well-developed. He is obese.  HENT:     Head: Normocephalic.     Right Ear: Tympanic membrane normal.     Left Ear: Tympanic membrane normal.  Eyes:     General:        Right eye: No discharge.        Left eye: No discharge.     Pupils: Pupils are equal, round, and reactive to light.  Neck:     Thyroid: No thyromegaly.  Cardiovascular:     Rate and Rhythm: Normal rate and regular rhythm.     Heart sounds: Normal heart sounds. No murmur heard. Pulmonary:     Effort: Pulmonary effort is normal. No respiratory distress.     Breath sounds: Normal breath sounds. No wheezing.  Abdominal:     General: Bowel sounds are normal. There is no distension.     Palpations: Abdomen is  soft.     Tenderness: There is no abdominal tenderness.  Musculoskeletal:        General: No tenderness. Normal range of motion.     Cervical back: Normal range of motion and neck supple.  Skin:    General: Skin is warm and dry.     Findings: No erythema or rash.  Neurological:     Mental Status: He is alert and oriented to person, place, and time.     Cranial Nerves: No cranial nerve deficit.     Deep Tendon Reflexes: Reflexes are normal and symmetric.  Psychiatric:        Behavior: Behavior normal.        Thought Content: Thought content normal.        Judgment: Judgment normal.    Diabetic Foot Exam - Simple   Simple Foot Form Diabetic Foot exam was performed  with the following findings: Yes 11/30/2022  8:45 AM  Visual Inspection No deformities, no ulcerations, no other skin breakdown bilaterally: Yes Sensation Testing Intact to touch and monofilament testing bilaterally: Yes Pulse Check Posterior Tibialis and Dorsalis pulse intact bilaterally: Yes Comments      BP 134/76   Pulse 72   Temp 98.2 F (36.8 C) (Temporal)   Ht 6' (1.829 m)   Wt 227 lb 6.4 oz (103.1 kg)   SpO2 97%   BMI 30.84 kg/m      Assessment & Plan:  MCALLISTER DALESIO comes in today with chief complaint of Medical Management of Chronic Issues   Diagnosis and orders addressed:  1. Annual physical exam - Bayer DCA Hb A1c Waived - CMP14+EGFR - CBC with Differential/Platelet - Lipid panel - TSH - PSA, total and free  2. Type 2 diabetes mellitus with other specified complication, without long-term current use of insulin (HCC) - Bayer DCA Hb A1c Waived - CMP14+EGFR - CBC with Differential/Platelet - Microalbumin / creatinine urine ratio  3. Gastroesophageal reflux disease, unspecified whether esophagitis present - CMP14+EGFR - CBC with Differential/Platelet  4. Obesity (BMI 30-39.9) - CMP14+EGFR - CBC with Differential/Platelet  5. Other spondylosis with radiculopathy, lumbar region -  CMP14+EGFR - CBC with Differential/Platelet  6. Hyperlipidemia associated with type 2 diabetes mellitus (HCC) - CMP14+EGFR - CBC with Differential/Platelet - Lipid panel  7. Hypertension associated with diabetes (HCC) - CMP14+EGFR - CBC with Differential/Platelet  8. Iron deficiency - CMP14+EGFR - CBC with Differential/Platelet  9. Lumbar foraminal stenosis - CMP14+EGFR - CBC with Differential/Platelet   Labs pending Continue current medications  Health Maintenance reviewed Diet and exercise encouraged  Follow up plan: 6 months    Jannifer Rodney, FNP

## 2022-11-30 NOTE — Patient Instructions (Signed)
Essential Tremor A tremor is trembling or shaking that a person cannot control. Most tremors affect the hands or arms. Tremors can also affect the head, vocal cords, legs, and other parts of the body. Essential tremor is a tremor without a known cause. Usually, it occurs while a person is trying to perform an action. It tends to get worse gradually as a person ages. What are the causes? The cause of this condition is not known, but it often runs in families. What increases the risk? You are more likely to develop this condition if: You have a family member with essential tremor. You are 40 years of age or older. What are the signs or symptoms? The main sign of a tremor is a rhythmic shaking of certain parts of your body that is uncontrolled and unintentional. You may: Have difficulty eating with a spoon or fork. Have difficulty writing. Nod your head up and down or side to side. Have a quivering voice. The shaking may: Get worse over time. Come and go. Be more noticeable on one side of your body. Get worse due to stress, tiredness (fatigue), caffeine, and extreme heat or cold. How is this diagnosed? This condition may be diagnosed based on: Your symptoms and medical history. A physical exam. There is no single test to diagnose an essential tremor. However, your health care provider may order tests to rule out other causes of your condition. These may include: Blood and urine tests. Imaging studies of your brain, such as a CT scan or MRI. How is this treated? Treatment for essential tremor depends on the severity of the condition. Mild tremors may not need treatment if they do not affect your day-to-day life. Severe tremors may need to be treated using one or more of the following options: Medicines. Injections of a substance called botulinum toxin. Procedures such as deep brain stimulation (DBS) implantation or MRI-guided ultrasound treatment. Lifestyle changes. Occupational or  physical therapy. Follow these instructions at home: Lifestyle  Do not use any products that contain nicotine or tobacco. These products include cigarettes, chewing tobacco, and vaping devices, such as e-cigarettes. If you need help quitting, ask your health care provider. Limit your caffeine intake as told by your health care provider. Try to get 8 hours of sleep each night. Find ways to manage your stress that fit your lifestyle and personality. Consider trying meditation or yoga. Try to anticipate stressful situations and allow extra time to manage them. If you are struggling emotionally with the effects of your tremor, consider working with a mental health provider. General instructions Take over-the-counter and prescription medicines only as told by your health care provider. Avoid extreme heat and extreme cold. Keep all follow-up visits. This is important. Visits may include physical therapy visits. Where to find more information National Institute of Neurological Disorders and Stroke: www.ninds.nih.gov Contact a health care provider if: You experience any changes in the location or intensity of your tremors. You start having a tremor after starting a new medicine. You have a tremor with other symptoms, such as: Numbness. Tingling. Pain. Weakness. Your tremor gets worse. Your tremor interferes with your daily life. You feel down, blue, or sad for at least 2 weeks in a row. Worrying about your tremor and what other people think about you interferes with your everyday life functions, including relationships, work, or school. Summary Essential tremor is a tremor without a known cause. Usually, it occurs when you are trying to perform an action. You are more likely   to develop this condition if you have a family member with essential tremor. The main sign of a tremor is a rhythmic shaking of certain parts of your body that is uncontrolled and unintentional. Treatment for essential  tremor depends on the severity of the condition. This information is not intended to replace advice given to you by your health care provider. Make sure you discuss any questions you have with your health care provider. Document Revised: 12/02/2020 Document Reviewed: 12/02/2020 Elsevier Patient Education  2024 Elsevier Inc.  

## 2022-12-01 LAB — CBC WITH DIFFERENTIAL/PLATELET
Basophils Absolute: 0.1 10*3/uL (ref 0.0–0.2)
Basos: 1 %
EOS (ABSOLUTE): 0.1 10*3/uL (ref 0.0–0.4)
Eos: 1 %
Hematocrit: 45.2 % (ref 37.5–51.0)
Hemoglobin: 14.4 g/dL (ref 13.0–17.7)
Immature Grans (Abs): 0 10*3/uL (ref 0.0–0.1)
Immature Granulocytes: 0 %
Lymphocytes Absolute: 1.2 10*3/uL (ref 0.7–3.1)
Lymphs: 19 %
MCH: 28.6 pg (ref 26.6–33.0)
MCHC: 31.9 g/dL (ref 31.5–35.7)
MCV: 90 fL (ref 79–97)
Monocytes Absolute: 0.8 10*3/uL (ref 0.1–0.9)
Monocytes: 12 %
Neutrophils Absolute: 4.3 10*3/uL (ref 1.4–7.0)
Neutrophils: 67 %
Platelets: 177 10*3/uL (ref 150–450)
RBC: 5.04 x10E6/uL (ref 4.14–5.80)
RDW: 13.1 % (ref 11.6–15.4)
WBC: 6.5 10*3/uL (ref 3.4–10.8)

## 2022-12-01 LAB — LIPID PANEL
Chol/HDL Ratio: 2.4 {ratio} (ref 0.0–5.0)
Cholesterol, Total: 87 mg/dL — ABNORMAL LOW (ref 100–199)
HDL: 37 mg/dL — ABNORMAL LOW (ref >39)
LDL Chol Calc (NIH): 22 mg/dL (ref 0–99)
Triglycerides: 174 mg/dL — ABNORMAL HIGH (ref 0–149)
VLDL Cholesterol Cal: 28 mg/dL (ref 5–40)

## 2022-12-01 LAB — MICROALBUMIN / CREATININE URINE RATIO
Creatinine, Urine: 114.8 mg/dL
Microalb/Creat Ratio: 6 mg/g{creat} (ref 0–29)
Microalbumin, Urine: 7 ug/mL

## 2022-12-01 LAB — CMP14+EGFR
ALT: 18 [IU]/L (ref 0–44)
AST: 21 [IU]/L (ref 0–40)
Albumin: 4.1 g/dL (ref 3.9–4.9)
Alkaline Phosphatase: 48 [IU]/L (ref 44–121)
BUN/Creatinine Ratio: 16 (ref 10–24)
BUN: 13 mg/dL (ref 8–27)
Bilirubin Total: 1.6 mg/dL — ABNORMAL HIGH (ref 0.0–1.2)
CO2: 23 mmol/L (ref 20–29)
Calcium: 8.8 mg/dL (ref 8.6–10.2)
Chloride: 104 mmol/L (ref 96–106)
Creatinine, Ser: 0.83 mg/dL (ref 0.76–1.27)
Globulin, Total: 1.8 g/dL (ref 1.5–4.5)
Glucose: 125 mg/dL — ABNORMAL HIGH (ref 70–99)
Potassium: 4.2 mmol/L (ref 3.5–5.2)
Sodium: 142 mmol/L (ref 134–144)
Total Protein: 5.9 g/dL — ABNORMAL LOW (ref 6.0–8.5)
eGFR: 95 mL/min/{1.73_m2} (ref >59)

## 2022-12-01 LAB — TSH: TSH: 4.3 u[IU]/mL (ref 0.450–4.500)

## 2022-12-01 LAB — IRON,TIBC AND FERRITIN PANEL
Ferritin: 38 ng/mL (ref 30–400)
Iron Saturation: 18 % (ref 15–55)
Iron: 58 ug/dL (ref 38–169)
Total Iron Binding Capacity: 326 ug/dL (ref 250–450)
UIBC: 268 ug/dL (ref 111–343)

## 2022-12-01 LAB — PSA, TOTAL AND FREE
PSA, Free Pct: 27.7 %
PSA, Free: 0.72 ng/mL
Prostate Specific Ag, Serum: 2.6 ng/mL (ref 0.0–4.0)

## 2022-12-04 ENCOUNTER — Telehealth: Payer: Self-pay | Admitting: Family

## 2022-12-04 MED ORDER — PREDNISONE 10 MG (21) PO TBPK: ORAL_TABLET | ORAL | 0 refills | Status: DC

## 2022-12-04 MED ORDER — BENZONATATE 200 MG PO CAPS: 200.0000 mg | ORAL_CAPSULE | Freq: Three times a day (TID) | ORAL | 1 refills | Status: DC | PRN

## 2022-12-04 NOTE — Telephone Encounter (Signed)
Wife called requesting to speak with nurse Morrie Sheldon, regarding pt. Says he has a cough that wont go away and nothing that he has taken OTC is helping. Unable to schedule an appt because they are about to get on a plane due to a family emergency. Wants to know if PCP can send in a Rx for cough?

## 2022-12-04 NOTE — Telephone Encounter (Signed)
Prednisone and tessalon Prescription sent to pharmacy.    

## 2022-12-04 NOTE — Telephone Encounter (Signed)
Patient aware and verbalized understanding. °

## 2022-12-18 ENCOUNTER — Other Ambulatory Visit: Payer: Self-pay | Admitting: Family

## 2022-12-18 DIAGNOSIS — K219 Gastro-esophageal reflux disease without esophagitis: Secondary | ICD-10-CM

## 2023-01-17 ENCOUNTER — Encounter: Payer: Self-pay | Admitting: Orthopaedic Surgery

## 2023-01-17 ENCOUNTER — Other Ambulatory Visit (INDEPENDENT_AMBULATORY_CARE_PROVIDER_SITE_OTHER): Payer: Medicare HMO

## 2023-01-17 ENCOUNTER — Ambulatory Visit: Payer: Medicare HMO | Admitting: Orthopaedic Surgery

## 2023-01-17 VITALS — Ht 72.0 in | Wt 230.0 lb

## 2023-01-17 DIAGNOSIS — M549 Dorsalgia, unspecified: Secondary | ICD-10-CM

## 2023-01-17 NOTE — Progress Notes (Signed)
Office Visit Note   Patient: Jason Rogers           Date of Birth: 04-04-1955           MRN: [id number] Visit Date: 01/17/2023              Requested by: Junie Spencer, FNP [address] [place],  [place] [address] PCP: Junie Spencer, FNP   Assessment & Plan: Visit Diagnoses:  1. Mid back pain     Plan: Patient with some thoracic spondylosis.  With recent deaths in the family patient and wife have a lot going on currently some of this tension may be aggravating his thoracic symptoms.  If he decides he like to proceed with some therapy he will call the office and let us know and we will set this up for modalities treatment for his thoracic pain.  X-rays were reviewed.  Follow-Up Instructions: No follow-ups on file.   Orders:  Orders Placed This Encounter  Procedures   XR Thoracic Spine 2 View   No orders of the defined types were placed in this encounter.     Procedures: No procedures performed   Clinical Data: No additional findings.   Subjective: Chief Complaint  Patient presents with   Middle Back - Pain    HPI 67 year old male returns with ongoing problems with chronic midthoracic pain.  Patient is here with his wife they have had to deal with 3 recent test with [facility] in [place].  Patient denies myelopathic problems.  He has known lumbar spondylosis with some foraminal stenosis.  Denies any specific injury.  He has had back and right leg pain symptoms and had previous epidurals with Dr. Alvester Morin no claudication symptoms.  Does have type 2 diabetes hyperlipidemia.  Review of Systems 14 point system update unchanged from 09/13/2022.  Of note is type 2 diabetes hypertension and some lumbar foraminal stenosis.   Objective: Vital Signs: Ht 6' (1.829 m)   Wt 230 lb (104.3 kg)   BMI 31.19 kg/m   Physical Exam Constitutional:      Appearance: He is well-developed.  HENT:     Head: Normocephalic and atraumatic.     Right Ear: External ear  normal.     Left Ear: External ear normal.  Eyes:     Pupils: Pupils are equal, round, and reactive to light.  Neck:     Thyroid: No thyromegaly.     Trachea: No tracheal deviation.  Cardiovascular:     Rate and Rhythm: Normal rate.  Pulmonary:     Effort: Pulmonary effort is normal.     Breath sounds: No wheezing.  Abdominal:     General: Bowel sounds are normal.     Palpations: Abdomen is soft.  Musculoskeletal:     Cervical back: Neck supple.  Skin:    General: Skin is warm and dry.     Capillary Refill: Capillary refill takes less than 2 seconds.  Neurological:     Mental Status: He is alert and oriented to person, place, and time.  Psychiatric:        Behavior: Behavior normal.        Thought Content: Thought content normal.        Judgment: Judgment normal.     Ortho Exam normal heel-toe gait.  Some tenderness over the thoracic region.  Specialty Comments:  No specialty comments available.  Imaging: XR Thoracic Spine 2 View  Result Date: 01/18/2023 AP lateral thoracic spine  images are obtained and reviewed.  No curvature on AP x-ray.  Endplate spurring consistent with multilevel spondylosis throughout the thoracic spine with spurring slightly more prominent on the right than the left side. Impression thoracic spondylosis negative for thoracic vertebral compression.    PMFS History: Patient Active Problem List   Diagnosis Date Noted   Lumbar foraminal stenosis 04/07/2022   Other spondylosis with radiculopathy, lumbar region 05/31/2021   Quadriceps weakness 04/27/2021   Right knee meniscal tear 04/05/2021   Iron deficiency 10/02/2019   GERD (gastroesophageal reflux disease) 12/28/2016   Obesity (BMI 30-39.9) 11/18/2015   Hyperlipidemia associated with type 2 diabetes mellitus (HCC) 04/10/2013   Hypertension associated with diabetes (HCC) 04/10/2013   Diabetes mellitus, type 2 (HCC) 04/10/2013   Past Medical History:  Diagnosis Date   Allergy    Diabetes  mellitus without complication (HCC)    Hyperlipidemia    Hypertension     Family History  Problem Relation Age of Onset   Diabetes Mother    Heart disease Father     Past Surgical History:  Procedure Laterality Date   HERNIA REPAIR     TESTICLE SURGERY     TONSILLECTOMY AND ADENOIDECTOMY     VASECTOMY     Social History   Occupational History    Comment: loads steel 3x per week  Tobacco Use   Smoking status: Never   Smokeless tobacco: Never  Vaping Use   Vaping status: Never Used  Substance and Sexual Activity   Alcohol use: Yes    Comment: rare   Drug use: No   Sexual activity: Not on file

## 2023-01-21 ENCOUNTER — Ambulatory Visit (INDEPENDENT_AMBULATORY_CARE_PROVIDER_SITE_OTHER): Payer: Medicare HMO

## 2023-01-21 DIAGNOSIS — Z23 Encounter for immunization: Secondary | ICD-10-CM

## 2023-02-01 DIAGNOSIS — Z0100 Encounter for examination of eyes and vision without abnormal findings: Secondary | ICD-10-CM | POA: Diagnosis not present

## 2023-02-01 DIAGNOSIS — H5213 Myopia, bilateral: Secondary | ICD-10-CM | POA: Diagnosis not present

## 2023-02-01 DIAGNOSIS — E119 Type 2 diabetes mellitus without complications: Secondary | ICD-10-CM | POA: Diagnosis not present

## 2023-02-06 DIAGNOSIS — E1169 Type 2 diabetes mellitus with other specified complication: Secondary | ICD-10-CM | POA: Diagnosis not present

## 2023-02-06 DIAGNOSIS — E785 Hyperlipidemia, unspecified: Secondary | ICD-10-CM | POA: Diagnosis not present

## 2023-02-12 ENCOUNTER — Other Ambulatory Visit: Payer: Self-pay | Admitting: Family

## 2023-02-12 DIAGNOSIS — M4726 Other spondylosis with radiculopathy, lumbar region: Secondary | ICD-10-CM

## 2023-02-13 DIAGNOSIS — I10 Essential (primary) hypertension: Secondary | ICD-10-CM | POA: Diagnosis not present

## 2023-02-13 DIAGNOSIS — E785 Hyperlipidemia, unspecified: Secondary | ICD-10-CM | POA: Diagnosis not present

## 2023-02-13 DIAGNOSIS — E1169 Type 2 diabetes mellitus with other specified complication: Secondary | ICD-10-CM | POA: Diagnosis not present

## 2023-02-13 DIAGNOSIS — E1159 Type 2 diabetes mellitus with other circulatory complications: Secondary | ICD-10-CM | POA: Diagnosis not present

## 2023-02-25 ENCOUNTER — Ambulatory Visit: Payer: Medicare HMO | Admitting: Cardiovascular Disease

## 2023-03-01 ENCOUNTER — Ambulatory Visit: Payer: Medicare HMO | Admitting: Cardiovascular Disease

## 2023-03-12 ENCOUNTER — Other Ambulatory Visit: Payer: Self-pay | Admitting: Family

## 2023-03-12 DIAGNOSIS — E611 Iron deficiency: Secondary | ICD-10-CM

## 2023-03-14 ENCOUNTER — Telehealth: Payer: Self-pay | Admitting: Physical Medicine and Rehabilitation

## 2023-03-14 NOTE — Telephone Encounter (Signed)
Patient called. Would like an appointment with Dr. Alvester Morin

## 2023-03-15 ENCOUNTER — Other Ambulatory Visit: Payer: Self-pay | Admitting: Physical Medicine and Rehabilitation

## 2023-03-15 DIAGNOSIS — M5416 Radiculopathy, lumbar region: Secondary | ICD-10-CM

## 2023-04-01 ENCOUNTER — Ambulatory Visit: Payer: Self-pay | Admitting: Family

## 2023-04-01 NOTE — Telephone Encounter (Signed)
Called patients wife appointment made for in the morning with pcp

## 2023-04-01 NOTE — Telephone Encounter (Signed)
  Chief Complaint: R breast swelling Symptoms: swelling, rash Frequency: 03/27/23 Pertinent Negatives: Patient denies fever, open wound, warmth to touch Disposition: [] ED /[] Urgent Care (no appt availability in office) / [x] Appointment(In office/virtual)/ []  [facility]/ [] Home Care/ [] Refused Recommended Disposition /[] [facility]/ []  Follow-up with PCP Additional Notes: Patient wife, Mollie on [place], calls stating patient has had swelling under R breast and rash since 03/27/23. Denies fever, wound, warmth to touch. Per protocol, patient to be evaluated within 24 hours. Caller requests to see Jerrel Ivory if PCP is not available. Scheduled for 04/02/23 @ 1035. Caller is requesting PCP callback if they can be seen today. Care advice reviewed, caller verbalized understanding. Alerting PCP for review.   Copied from CRM [id number]. Topic: Clinical - Medical Advice >> Apr 01, 2023  8:55 AM Ivette P wrote: Reason for CRM: Pt is experiencing Soreness and swellling under right breast. Spotted rash under breast and off to the side. Pt wanted to schedule an appt with dr. Lendon Colonel. Could not come in today. Pt would like to speak to a nurse about symptoms.  Number [phone] Reason for Disposition  [1] Breast looks infected (spreading redness, feels hot or painful to touch) AND [2] no fever  Answer Assessment - Initial Assessment Questions 1. SYMPTOM: "What's the main symptom you're concerned about?"  (e.g., lump, pain, rash, nipple discharge)     Generalized swelling under R breast with rash underneath working around to R side. States it is sore around area where rash is. 2. LOCATION: "Where is the rash located?"     Under R breast 3. ONSET: "When did symptoms  start?"     03/27/23 4. PRIOR HISTORY: "Do you have any history of prior problems with your breasts?" (e.g., lumps, cancer, fibrocystic breast disease)     Denies 5. CAUSE: "What do you think is causing this symptom?"     Unsure, has  never had this before 6. OTHER SYMPTOMS: "Do you have any other symptoms?" (e.g., fever, breast pain, redness or rash, nipple discharge)     Rash, red spots approx 5-6 inches.  Protocols used: Breast Symptoms-A-AH

## 2023-04-02 ENCOUNTER — Ambulatory Visit (INDEPENDENT_AMBULATORY_CARE_PROVIDER_SITE_OTHER): Payer: Medicare HMO | Admitting: Family

## 2023-04-02 ENCOUNTER — Ambulatory Visit: Payer: Medicare HMO | Admitting: Family Medicine

## 2023-04-02 ENCOUNTER — Encounter: Payer: Self-pay | Admitting: Family

## 2023-04-02 VITALS — BP 168/92 | HR 71 | Temp 97.6°F | Ht 72.0 in | Wt 230.2 lb

## 2023-04-02 DIAGNOSIS — B029 Zoster without complications: Secondary | ICD-10-CM | POA: Diagnosis not present

## 2023-04-02 NOTE — Patient Instructions (Addendum)
Shingles  Shingles, or herpes zoster, is an infection. It gives you a skin rash and blisters. These infected areas may hurt a lot. Shingles only happens if: You've had chickenpox. You've been given a shot called a vaccine to protect you from getting chickenpox. Shingles is rare in this case. What are the causes? Shingles is caused by a germ called the varicella-zoster virus. This is the same germ that causes chickenpox. After you're exposed to the germ, it stays in your body but is dormant. This means it isn't active. Shingles happens if the germ becomes active again. This can happen years after you're first exposed to the germ. What increases the risk? You may be more likely to get shingles if: You're older than 69 years of age. You're under a lot of stress. You have a weak immune system. The immune system is your body's defense system. It may be weak if: You have human immunodeficiency virus (HIV). You have acquired immunodeficiency syndrome (AIDS). You have cancer. You take medicines that weaken your immune system. These include organ transplant medicines. What are the signs or symptoms? The first symptoms of shingles may be itching, tingling, or pain. Your skin may feel like it's burning. A few days or weeks later, you'll get a rash. Here's what you can expect: The rash is likely to be on one side of your body. The rash may be shaped like a belt or a band. Over time, it will turn into blisters filled with fluid. The blisters will break open and change into scabs. The scabs will dry up in about 2-3 weeks. You may also have: A fever. Chills. A headache. Nausea. How is this diagnosed? Shingles is diagnosed with a skin exam. A sample called a culture may be taken from one of your blisters and sent to a lab. This will show if you have shingles. How is this treated? The rash may last for several weeks. There's no cure for shingles, but your health care provider may give you medicines.  These medicines may: Help with pain. Help with itching. Help with irritation and swelling. Help you get better sooner. Help to prevent long-term problems. If the rash is on your face, you may need to see an eye doctor or an ear, nose, and throat (ENT) doctor. Follow these instructions at home: Medicines Take your medicines only as told by your provider. Put an anti-itch cream or numbing cream on the rash or blisters as told by your provider. Relieving itching and discomfort  To help with itching: Put cold, wet cloths called cold compresses on the rash or blisters. Take a cool bath. Try adding baking soda or dry oatmeal to the water. Do not bathe in hot water. Use calamine lotion on the rash or blisters. You can get this type of lotion at the store. Blister and rash care Keep your rash covered with a loose bandage. Wear loose clothes that don't rub on your rash. Take care of your rash as told by your provider. Make sure you: Wash your hands with soap and water for at least 20 seconds before and after you change your bandage. If you can't use soap and water, use hand sanitizer. Keep your rash and blisters clean by washing them with mild soap and cool water. Change your bandage. Check your rash every day for signs of infection. Check for: More redness, swelling, or pain. Fluid or blood. Warmth. Pus or a bad smell. Do not scratch your rash. Do not pick at your  blisters. To help you not scratch: Keep your fingernails clean and cut short. Try to wear gloves or mittens when you sleep. General instructions Rest. Wash your hands often with soap and water for at least 20 seconds. If you can't use soap and water, use hand sanitizer. Washing your hands lowers your chance of getting a skin infection. Your infection can cause chickenpox in others. If you have blisters that aren't scabs yet, stay away from: Babies. Pregnant people. Children who have eczema. Older people who have organ  transplants. People who have a long-term, or chronic, illness. Anyone who hasn't had chickenpox before. Anyone who hasn't gotten the chickenpox vaccine. How is this prevented? Vaccines are the best way to prevent you from getting chickenpox or shingles. Talk with your provider about getting these shots. Where to find more information Centers for Disease Control and Prevention (CDC): [employer] Contact a health care provider if: Your pain doesn't get better with medicine. Your pain doesn't get better after the rash heals. You have any signs of infection around the rash. Your rash or blisters get worse. You have a fever or chills. Get help right away if: The rash is on your face or nose. You have pain in your face or by your eye. You lose feeling on one side of your face. You have trouble seeing. You have ear pain or ringing in your ear. This information is not intended to replace advice given to you by your health care provider. Make sure you discuss any questions you have with your health care provider.   Essential Tremor A tremor is trembling or shaking that a person cannot control. Most tremors affect the hands or arms. Tremors can also affect the head, vocal cords, legs, and other parts of the body. Essential tremor is a tremor without a known cause. Usually, it occurs while a person is trying to perform an action. It tends to get worse gradually as a person ages. What are the causes? The cause of this condition is not known, but it often runs in families. What increases the risk? You are more likely to develop this condition if: You have a family member with essential tremor. You are 74 years of age or older. What are the signs or symptoms? The main sign of a tremor is a rhythmic shaking of certain parts of your body that is uncontrolled and unintentional. You may: Have difficulty eating with a spoon or fork. Have difficulty writing. Nod your head up and down or side to  side. Have a quivering voice. The shaking may: Get worse over time. Come and go. Be more noticeable on one side of your body. Get worse due to stress, tiredness (fatigue), caffeine, and extreme heat or cold. How is this diagnosed? This condition may be diagnosed based on: Your symptoms and medical history. A physical exam. There is no single test to diagnose an essential tremor. However, your health care provider may order tests to rule out other causes of your condition. These may include: Blood and urine tests. Imaging studies of your brain, such as a CT scan or MRI. How is this treated? Treatment for essential tremor depends on the severity of the condition. Mild tremors may not need treatment if they do not affect your day-to-day life. Severe tremors may need to be treated using one or more of the following options: Medicines. Injections of a substance called botulinum toxin. Procedures such as deep brain stimulation (DBS) implantation or MRI-guided ultrasound treatment. Lifestyle changes. Occupational  or physical therapy. Follow these instructions at home: Lifestyle  Do not use any products that contain nicotine or tobacco. These products include cigarettes, chewing tobacco, and vaping devices, such as e-cigarettes. If you need help quitting, ask your health care provider. Limit your caffeine intake as told by your health care provider. Try to get 8 hours of sleep each night. Find ways to manage your stress that fit your lifestyle and personality. Consider trying meditation or yoga. Try to anticipate stressful situations and allow extra time to manage them. If you are struggling emotionally with the effects of your tremor, consider working with a mental health provider. General instructions Take over-the-counter and prescription medicines only as told by your health care provider. Avoid extreme heat and extreme cold. Keep all follow-up visits. This is important. Visits may  include physical therapy visits. Where to find more information [employer] [facility] Neurological Disorders and Stroke: [employer] Contact a health care provider if: You experience any changes in the location or intensity of your tremors. You start having a tremor after starting a new medicine. You have a tremor with other symptoms, such as: Numbness. Tingling. Pain. Weakness. Your tremor gets worse. Your tremor interferes with your daily life. You feel down, blue, or sad for at least 2 weeks in a row. Worrying about your tremor and what other people think about you interferes with your everyday life functions, including relationships, work, or school. Summary Essential tremor is a tremor without a known cause. Usually, it occurs when you are trying to perform an action. You are more likely to develop this condition if you have a family member with essential tremor. The main sign of a tremor is a rhythmic shaking of certain parts of your body that is uncontrolled and unintentional. Treatment for essential tremor depends on the severity of the condition. This information is not intended to replace advice given to you by your health care provider. Make sure you discuss any questions you have with your health care provider. Document Revised: 12/02/2020 Document Reviewed: 12/02/2020 Elsevier Patient Education  2024 Elsevier Inc. Document Revised: 11/15/2022 Document Reviewed: 03/30/2022 Elsevier Patient Education  2024 [employer].

## 2023-04-02 NOTE — Progress Notes (Signed)
 Subjective:    Patient ID: Jason Rogers, male    DOB: June 26, 1955, 68 y.o.   MRN: [id number]  Chief Complaint  Patient presents with   Rash    Rash under right breast and tender. Been going on for over a week and a half     Rash   Pt present to the office today with a rash under right breast that he noticed a week and half ago. Reports the rash is burning and tingling, but improving now. Has not taken anything for it.  Reports his shirt laying on it is tender.    Review of Systems  Skin:  Positive for rash.  All other systems reviewed and are negative.   Social History   Socioeconomic History   Marital status: Married    Spouse name: Geofm   Number of children: Not on file   Years of education: Not on file   Highest education level: Not on file  Occupational History    Comment: loads steel 3x per week  Tobacco Use   Smoking status: Never   Smokeless tobacco: Never  Vaping Use   Vaping status: Never Used  Substance and Sexual Activity   Alcohol use: Yes    Comment: rare   Drug use: No   Sexual activity: Not on file  Other Topics Concern   Not on file  Social History Narrative   Not on file   Social Drivers of Health   Financial Resource Strain: Low Risk  (03/30/2022)   Overall Financial Resource Strain (CARDIA)    Difficulty of Paying Living Expenses: Not hard at all  Food Insecurity: No Food Insecurity (03/30/2022)   Hunger Vital Sign    Worried About Running Out of Food in the Last Year: Never true    Ran Out of Food in the Last Year: Never true  Transportation Needs: No Transportation Needs (03/30/2022)   PRAPARE - Administrator, Civil Service (Medical): No    Lack of Transportation (Non-Medical): No  Physical Activity: Sufficiently Active (03/30/2022)   Exercise Vital Sign    Days of Exercise per Week: 7 days    Minutes of Exercise per Session: 30 min  Stress: No Stress Concern Present (03/30/2022)   [employer] of Occupational Health -  Occupational Stress Questionnaire    Feeling of Stress : Not at all  Social Connections: Moderately Isolated (03/30/2022)   Social Connection and Isolation Panel [NHANES]    Frequency of Communication with Friends and Family: More than three times a week    Frequency of Social Gatherings with Friends and Family: More than three times a week    Attends Religious Services: Never    Database Administrator or Organizations: No    Attends Banker Meetings: Never    Marital Status: Married   Family History  Problem Relation Age of Onset   Diabetes Mother    Heart disease Father         Objective:   Physical Exam Vitals reviewed.  Constitutional:      General: He is not in acute distress.    Appearance: He is well-developed.  HENT:     Head: Normocephalic.     Right Ear: External ear normal.     Left Ear: External ear normal.  Eyes:     General:        Right eye: No discharge.        Left eye: No discharge.  Pupils: Pupils are equal, round, and reactive to light.  Neck:     Thyroid : No thyromegaly.  Cardiovascular:     Rate and Rhythm: Normal rate and regular rhythm.     Heart sounds: Normal heart sounds. No murmur heard. Pulmonary:     Effort: Pulmonary effort is normal. No respiratory distress.     Breath sounds: Normal breath sounds. No wheezing.  Abdominal:     General: Bowel sounds are normal. There is no distension.     Palpations: Abdomen is soft.     Tenderness: There is no abdominal tenderness.  Musculoskeletal:        General: No tenderness. Normal range of motion.     Cervical back: Normal range of motion and neck supple.  Skin:    General: Skin is warm and dry.     Findings: No erythema or rash.          Comments: Papule rash under right breast, most crusted over  Neurological:     Mental Status: He is alert and oriented to person, place, and time.     Cranial Nerves: No cranial nerve deficit.     Deep Tendon Reflexes: Reflexes are normal  and symmetric.  Psychiatric:        Behavior: Behavior normal.        Thought Content: Thought content normal.        Judgment: Judgment normal.       BP (!) 168/92   Pulse 71   Temp 97.6 F (36.4 C) (Temporal)   Ht 6' (1.829 m)   Wt 230 lb 3.2 oz (104.4 kg)   SpO2 97%   BMI 31.22 kg/m      Assessment & Plan:  Jason Rogers comes in today with chief complaint of Rash (Rash under right breast and tender. Been going on for over a week and a half )   Diagnosis and orders addressed:  1. Herpes zoster without complication (Primary) Resolving at this time Out of window for Valtrex Tylenol as needed Follow up if symptoms worsen or do not improve    Bari Learn, FNP

## 2023-04-04 ENCOUNTER — Other Ambulatory Visit: Payer: Self-pay

## 2023-04-04 ENCOUNTER — Ambulatory Visit: Payer: Medicare HMO | Admitting: Physical Medicine and Rehabilitation

## 2023-04-04 VITALS — BP 185/82 | HR 76

## 2023-04-04 DIAGNOSIS — M5416 Radiculopathy, lumbar region: Secondary | ICD-10-CM | POA: Diagnosis not present

## 2023-04-04 MED ORDER — METHYLPREDNISOLONE ACETATE 40 MG/ML IJ SUSP
40.0000 mg | Freq: Once | INTRAMUSCULAR | Status: AC
Start: 2023-04-04 — End: 2023-04-04
  Administered 2023-04-04: 40 mg

## 2023-04-04 NOTE — Progress Notes (Signed)
Pain scale 10 Not allergic to contrast No blood thinners

## 2023-04-06 ENCOUNTER — Other Ambulatory Visit: Payer: Self-pay | Admitting: Family

## 2023-04-09 NOTE — Progress Notes (Signed)
 CARDIOLOGY CONSULT NOTE       Patient ID: Jason Rogers MRN: [id number] DOB/AGE: 04-18-1955 68 y.o.  Referring Physician: FNP Harlow Mares Primary Physician: Junie Spencer, FNP   HPI:  68 y.o. referred by FNP Harlow Mares for chest pain on 09/21/20  . History of DM-2, HLD and HTN. 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   Notes allergy ( depression ) with lipitor   His dad had one of the first CABG;s in [place] early 60's at [facility]  He is concerned about family history   He is originally from [employer] 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  Remarried with 4 kids in [place] and one stepson in [place]  Cardiac CT 10/10/20 calcium score 657 , 85 th percentile CADRADS 3 with 50-69% proximal RCA 40-59% proximal LAD Aortic root 40 mm FFR CT negative   Started on beta blocker and statin LDL 43 A1c 6.9   Sees Dr Ophelia Charter for right quad weakness and lumbar radiculopathy No work 8 weeks after office viist 05/31/21 and has been out since February Has had steroid injections    He has some atypical SSCP Mostly resting at night watching TV has taken nitro 3-5 times and sometimes helps but not always   Some atypical chest pain August 2024 PET/CT:  normal with no perfusion abnormalities Normal MBFR 2.66  Wife inherited a house in [place] and they have to go up there to try and sell it   ROS All other systems reviewed and negative except as noted above  Past Medical History:  Diagnosis Date   Allergy    Diabetes mellitus without complication (HCC)    Hyperlipidemia     Hypertension     Family History  Problem Relation Age of Onset   Diabetes Mother    Heart disease Father     Social History   Socioeconomic History   Marital status: Married    Spouse name: Kirt Boys   Number of children: Not on file   Years of education: Not on file   Highest education level: Not on file  Occupational History    Comment: loads steel 3x per week  Tobacco Use   Smoking status: Never   Smokeless tobacco: Never  Vaping Use   Vaping status: Never Used  Substance and Sexual Activity   Alcohol use: Yes    Comment: rare   Drug use: No   Sexual activity: Not on file  Other Topics Concern   Not on file  Social History Narrative   Not on file   Social Drivers of Health   Financial Resource Strain: Low Risk  (03/30/2022)   Overall Financial Resource Strain (CARDIA)    Difficulty of Paying Living Expenses: Not hard at all  Food Insecurity: No Food Insecurity (03/30/2022)   Hunger Vital Sign    Worried About Running Out of Food in the Last Year: Never true    Ran Out of Food in the Last Year: Never true  Transportation Needs: No Transportation Needs (03/30/2022)   PRAPARE - Transportation  Lack of Transportation (Medical): No    Lack of Transportation (Non-Medical): No  Physical Activity: Sufficiently Active (03/30/2022)   Exercise Vital Sign    Days of Exercise per Week: 7 days    Minutes of Exercise per Session: 30 min  Stress: No Stress Concern Present (03/30/2022)   [employer] of Occupational Health - Occupational Stress Questionnaire    Feeling of Stress : Not at all  Social Connections: Moderately Isolated (03/30/2022)   Social Connection and Isolation Panel [NHANES]    Frequency of Communication with Friends and Family: More than three times a week    Frequency of Social Gatherings with Friends and Family: More than three times a week    Attends Religious Services: Never    Database administrator or Organizations: No    Attends Banker  Meetings: Never    Marital Status: Married  Catering manager Violence: Not At Risk (03/30/2022)   Humiliation, Afraid, Rape, and Kick questionnaire    Fear of Current or Ex-Partner: No    Emotionally Abused: No    Physically Abused: No    Sexually Abused: No    Past Surgical History:  Procedure Laterality Date   HERNIA REPAIR     TESTICLE SURGERY     TONSILLECTOMY AND ADENOIDECTOMY     VASECTOMY        Current Outpatient Medications:    ascorbic acid (VITAMIN C) 500 MG tablet, Take by mouth., Disp: , Rfl:    aspirin 81 MG tablet, Take 81 mg by mouth daily., Disp: , Rfl:    benzonatate (TESSALON) 200 MG capsule, Take 1 capsule (200 mg total) by mouth 3 (three) times daily as needed., Disp: 30 capsule, Rfl: 1   cholecalciferol (VITAMIN D3) 25 MCG (1000 UNIT) tablet, Take 1,000 Units by mouth daily., Disp: , Rfl:    dapagliflozin propanediol (FARXIGA) 10 MG TABS tablet, Take 1 tablet (10 mg total) by mouth daily before breakfast., Disp: 90 tablet, Rfl: 3   Dulaglutide 3 MG/0.5ML SOPN, Inject into the skin., Disp: , Rfl:    FEROSUL 325 (65 Fe) MG tablet, TAKE 1 TABLET BY MOUTH AT BEDTIME, Disp: 90 tablet, Rfl: 0   Insulin Glargine (BASAGLAR KWIKPEN) 100 UNIT/ML, Inject into the skin daily., Disp: , Rfl:    lisinopril (ZESTRIL) 40 MG tablet, Take 1 tablet (40 mg total) by mouth daily., Disp: 90 tablet, Rfl: 3   loratadine (CLARITIN) 10 MG tablet, Take 1 tablet (10 mg total) by mouth daily., Disp: 90 tablet, Rfl: 3   meloxicam (MOBIC) 15 MG tablet, Take 1 tablet by mouth once daily, Disp: 90 tablet, Rfl: 1   metFORMIN (GLUCOPHAGE-XR) 500 MG 24 hr tablet, Take 4 tablets (2,000 mg total) by mouth daily with breakfast. TAKE 4 TABLETS BY MOUTH ONCE DAILY WITH BREAKFAST, Disp: 360 tablet, Rfl: 1   metoprolol succinate (TOPROL-XL) 25 MG 24 hr tablet, Take 1 tablet by mouth once daily, Disp: 90 tablet, Rfl: 0   Multiple Vitamin (MULTI-VITAMIN) tablet, Take 1 tablet by mouth daily., Disp: , Rfl:     omeprazole (PRILOSEC) 20 MG capsule, Take 1 capsule by mouth once daily, Disp: 90 capsule, Rfl: 1   rosuvastatin (CRESTOR) 20 MG tablet, Take 1 tablet (20 mg total) by mouth at bedtime., Disp: 90 tablet, Rfl: 3   nitroGLYCERIN (NITROSTAT) 0.4 MG SL tablet, Place 1 tablet (0.4 mg total) under the tongue every 5 (five) minutes as needed for chest pain., Disp: 90 tablet, Rfl: 3  Physical Exam: Blood pressure 132/80, pulse 77, height 6' (1.829 m), weight 228 lb 9.6 oz (103.7 kg), SpO2 98%.    Affect appropriate Healthy:  appears stated age HEENT: normal Neck supple with no adenopathy JVP normal no bruits no thyromegaly Lungs clear with no wheezing and good diaphragmatic motion Heart:  S1/S2 no murmur, no rub, gallop or click PMI normal Abdomen: benighn, BS positve, no tenderness, no AAA no bruit.  No HSM or HJR Distal pulses intact with no bruits No edema Neuro non-focal Some persistent soft tissue swelling left hand in splint  No muscular weakness   Labs:   Lab Results  Component Value Date   WBC 6.5 11/30/2022   HGB 14.4 11/30/2022   HCT 45.2 11/30/2022   MCV 90 11/30/2022   PLT 177 11/30/2022   No results for input(s): "NA", "K", "CL", "CO2", "BUN", "CREATININE", "CALCIUM", "PROT", "BILITOT", "ALKPHOS", "ALT", "AST", "GLUCOSE" in the last 168 hours.  Invalid input(s): "LABALBU" Lab Results  Component Value Date   TROPONINI <0.03 03/30/2017    Lab Results  Component Value Date   CHOL 87 (L) 11/30/2022   CHOL 101 05/14/2022   CHOL 85 (L) 11/13/2021   Lab Results  Component Value Date   HDL 37 (L) 11/30/2022   HDL 41 05/14/2022   HDL 35 (L) 11/13/2021   Lab Results  Component Value Date   LDLCALC 22 11/30/2022   LDLCALC 39 05/14/2022   LDLCALC 29 11/13/2021   Lab Results  Component Value Date   TRIG 174 (H) 11/30/2022   TRIG 116 05/14/2022   TRIG 117 11/13/2021   Lab Results  Component Value Date   CHOLHDL 2.4 11/30/2022   CHOLHDL 2.5 05/14/2022    CHOLHDL 2.4 11/13/2021   No results found for: "LDLDIRECT"    Radiology: XR C-ARM NO REPORT Result Date: 04/04/2023 Please see Notes tab for imaging impression.  Epidural Steroid injection Result Date: 04/04/2023 Tyrell Antonio, MD     04/12/2023  5:32 AM Lumbosacral Transforaminal Epidural Steroid Injection - Sub-Pedicular Approach with Fluoroscopic Guidance Patient: Jason Rogers     Date of Birth: March 07, 1955 MRN: [id number] PCP: Junie Spencer, FNP     Visit Date: 04/04/2023  Universal Protocol:   Date/Time: 04/04/2023 Consent Given By: the patient Position: PRONE Additional Comments: Vital signs were monitored before and after the procedure. Patient was prepped and draped in the usual sterile fashion. The correct patient, procedure, and site was verified. Injection Procedure Details: Procedure diagnoses: Lumbar radiculopathy [M54.16]  Meds Administered: Meds ordered this encounter Medications  methylPREDNISolone acetate (DEPO-MEDROL) injection 40 mg Laterality: Right Location/Site: L5 Needle:5.0 in., 22 ga.  Short bevel or Quincke spinal needle Needle Placement: Transforaminal Findings:   -Comments: Excellent flow of contrast along the nerve, nerve root and into the epidural space. Procedure Details: After squaring off the end-plates to get a true AP view, the C-arm was positioned so that an oblique view of the foramen as noted above was visualized. The target area is just inferior to the "nose of the scotty dog" or sub pedicular. The soft tissues overlying this structure were infiltrated with 2-3 ml. of 1% Lidocaine without Epinephrine. The spinal needle was inserted toward the target using a "trajectory" view along the fluoroscope beam.  Under AP and lateral visualization, the needle was advanced so it did not puncture dura and was located close the 6 O'Clock position of the pedical in AP tracterory. Biplanar projections were used to confirm position. Aspiration was confirmed to  be negative for CSF  and/or blood. A 1-2 ml. volume of Isovue-250 was injected and flow of contrast was noted at each level. Radiographs were obtained for documentation purposes. After attaining the desired flow of contrast documented above, a 0.5 to 1.0 ml test dose of 0.25% Marcaine was injected into each respective transforaminal space.  The patient was observed for 90 seconds post injection.  After no sensory deficits were reported, and normal lower extremity motor function was noted,   the above injectate was administered so that equal amounts of the injectate were placed at each foramen (level) into the transforaminal epidural space. Additional Comments: No complications occurred Dressing: 2 x 2 sterile gauze and Band-Aid  Post-procedure details: Patient was observed during the procedure. Post-procedure instructions were reviewed. Patient left the clinic in stable condition.    EKG: SR rate 89 normal    ASSESSMENT AND PLAN:   Chest pain: CRF;s DM, HTN, HLD pain a bit atypical and normal ECG Moderate RCA/LAD disease with high calcium score for age on CTA 10/10/20 Continue beta blocker , statin , ASA PET CT normal 10/18/22 see above  HTN:  Well controlled.  Continue current medications and low sodium Dash type diet.   HLD:  on crestor LDL at goal 39 05/14/22  DM:  Discussed low carb diet.  Target hemoglobin A1c is 6.5 or less.  Continue current medications.A1c better 6.4   Ortho:  right quad weakness with abnormal EMG and L34 radiculopathy f/u Dr Ophelia Charter    F/U in a year   Signed: Charlton Haws 04/19/2023, 8:34 AM

## 2023-04-10 DIAGNOSIS — L570 Actinic keratosis: Secondary | ICD-10-CM | POA: Diagnosis not present

## 2023-04-10 DIAGNOSIS — L814 Other melanin hyperpigmentation: Secondary | ICD-10-CM | POA: Diagnosis not present

## 2023-04-10 DIAGNOSIS — B029 Zoster without complications: Secondary | ICD-10-CM | POA: Diagnosis not present

## 2023-04-10 DIAGNOSIS — L821 Other seborrheic keratosis: Secondary | ICD-10-CM | POA: Diagnosis not present

## 2023-04-10 DIAGNOSIS — Z85828 Personal history of other malignant neoplasm of skin: Secondary | ICD-10-CM | POA: Diagnosis not present

## 2023-04-10 DIAGNOSIS — L111 Transient acantholytic dermatosis [Grover]: Secondary | ICD-10-CM | POA: Diagnosis not present

## 2023-04-12 NOTE — Progress Notes (Signed)
 FIELD Jason Rogers - 68 y.o. male MRN [id number]  Date of birth: April 14, 1955  Office Visit Note: Visit Date: 04/04/2023 PCP: Lavell Bari LABOR, FNP Referred by: Lavell Bari LABOR, FNP  Subjective: Chief Complaint  Patient presents with   Lower Back - Pain   HPI:  Jason Rogers is a 68 y.o. male who comes in today at the request of Dr. Oneil Herald for planned Right L5-S1 Lumbar Transforaminal epidural steroid injection with fluoroscopic guidance.  The patient has failed conservative care including home exercise, medications, time and activity modification.  This injection will be diagnostic and hopefully therapeutic.  Please see requesting physician notes for further details and justification.   ROS Otherwise per HPI.  Assessment & Plan: Visit Diagnoses:    ICD-10-CM   1. Lumbar radiculopathy  M54.16 XR C-ARM NO REPORT    Epidural Steroid injection    methylPREDNISolone  acetate (DEPO-MEDROL ) injection 40 mg      Plan: No additional findings.   Meds & Orders:  Meds ordered this encounter  Medications   methylPREDNISolone  acetate (DEPO-MEDROL ) injection 40 mg    Orders Placed This Encounter  Procedures   XR C-ARM NO REPORT   Epidural Steroid injection    Follow-up: Return for visit to requesting provider as needed.   Procedures: No procedures performed  Lumbosacral Transforaminal Epidural Steroid Injection - Sub-Pedicular Approach with Fluoroscopic Guidance  Patient: Jason Rogers      Date of Birth: May 27, 1955 MRN: [id number] PCP: Lavell Bari LABOR, FNP      Visit Date: 04/04/2023   Universal Protocol:    Date/Time: 04/04/2023  Consent Given By: the patient  Position: PRONE  Additional Comments: Vital signs were monitored before and after the procedure. Patient was prepped and draped in the usual sterile fashion. The correct patient, procedure, and site was verified.   Injection Procedure Details:   Procedure diagnoses: Lumbar radiculopathy [M54.16]    Meds  Administered:  Meds ordered this encounter  Medications   methylPREDNISolone  acetate (DEPO-MEDROL ) injection 40 mg    Laterality: Right  Location/Site: L5  Needle:5.0 in., 22 ga.  Short bevel or Quincke spinal needle  Needle Placement: Transforaminal  Findings:    -Comments: Excellent flow of contrast along the nerve, nerve root and into the epidural space.  Procedure Details: After squaring off the end-plates to get a true AP view, the C-arm was positioned so that an oblique view of the foramen as noted above was visualized. The target area is just inferior to the nose of the scotty dog or sub pedicular. The soft tissues overlying this structure were infiltrated with 2-3 ml. of 1% Lidocaine  without Epinephrine.  The spinal needle was inserted toward the target using a trajectory view along the fluoroscope beam.  Under AP and lateral visualization, the needle was advanced so it did not puncture dura and was located close the 6 O'Clock position of the pedical in AP tracterory. Biplanar projections were used to confirm position. Aspiration was confirmed to be negative for CSF and/or blood. A 1-2 ml. volume of Isovue -250 was injected and flow of contrast was noted at each level. Radiographs were obtained for documentation purposes.   After attaining the desired flow of contrast documented above, a 0.5 to 1.0 ml test dose of 0.25% Marcaine  was injected into each respective transforaminal space.  The patient was observed for 90 seconds post injection.  After no sensory deficits were reported, and normal lower extremity motor function was noted,   the above injectate  was administered so that equal amounts of the injectate were placed at each foramen (level) into the transforaminal epidural space.   Additional Comments:  No complications occurred Dressing: 2 x 2 sterile gauze and Band-Aid    Post-procedure details: Patient was observed during the procedure. Post-procedure instructions  were reviewed.  Patient left the clinic in stable condition.    Clinical History: CLINICAL DATA:  Quadriceps weakness M62.81 (ICD-10-CM). History of right L3-4 protrusion. Type 2 diabetes mellitus with hyperglycemia, without long-term current use of insulin  (HCC) E11.65 (ICD-10-CM).   EXAM: MRI LUMBAR SPINE WITHOUT CONTRAST   TECHNIQUE: Multiplanar, multisequence MR imaging of the lumbar spine was performed. No intravenous contrast was administered.   COMPARISON:  MRI of the lumbar spine September 19, 2014.   FINDINGS: Segmentation:  Standard.   Alignment:  Trace anterolisthesis of L4 over L5.   Vertebrae: No fracture, evidence of discitis, or bone lesion. Endplate degenerative changes at L3-4 and L5-S1, progressed since prior MRI.   Conus medullaris and cauda equina: Conus extends to the L1-2 level. Conus and cauda equina appear normal.   Paraspinal and other soft tissues: Left renal cyst.   Disc levels:   T12-L1: No spinal canal or neural foraminal.   L1-2: No spinal canal or neural foraminal stenosis.   L2-3: Shallow disc bulge with superimposed left foraminal disc protrusion and mild facet degenerative changes. Mild left neural foraminal narrowing. No significant spinal canal stenosis.   L3-4: Mild loss of disc height, disc bulge, moderate facet degenerative changes and ligamentum flavum redundancy. Findings result in mild spinal canal stenosis and mild bilateral neural foraminal narrowing, mildly progressed since prior MRI.   L4-5: Disc bulge with superimposed central disc protrusion, moderate facet degenerative changes, left greater than right, with ligamentum flavum redundancy and right trace joint effusion. Findings result in mild spinal canal stenosis with mild narrowing of the bilateral subarticular zones and mild-to-moderate bilateral neural foraminal narrowing findings have mildly progressed since prior MRI.   L5-S1: Progressive loss of disc height, right  asymmetric disc bulge with associated osteophytic component, moderate facet degenerative changes with ligamentum flavum redundancy, right greater than left. Findings result in mild narrowing of the right subarticular zone, severe right and moderate left neural foraminal narrowing, mildly progressed from prior.   IMPRESSION: 1. Interval progression of disc degeneration at L5-S1 where there is severe right and moderate left neural foraminal narrowing. 2. Mild spinal canal stenosis and mild-to-moderate bilateral neural foraminal narrowing at L4-5. 3. Mild bilateral neural foraminal narrowing at L3-4 and on the left at L2-3.     Electronically Signed   By: Katyucia  de Macedo Rodrigues M.D.   On: 05/01/2021 15:47     Objective:  VS:  HT:    WT:   BMI:     BP:(!) 185/82  HR:76bpm  TEMP: ( )  RESP:  Physical Exam Vitals and nursing note reviewed.  Constitutional:      General: He is not in acute distress.    Appearance: Normal appearance. He is not ill-appearing.  HENT:     Head: Normocephalic and atraumatic.     Right Ear: External ear normal.     Left Ear: External ear normal.     Nose: No congestion.  Eyes:     Extraocular Movements: Extraocular movements intact.  Cardiovascular:     Rate and Rhythm: Normal rate.     Pulses: Normal pulses.  Pulmonary:     Effort: Pulmonary effort is normal. No respiratory distress.  Abdominal:  General: There is no distension.     Palpations: Abdomen is soft.  Musculoskeletal:        General: No tenderness or signs of injury.     Cervical back: Neck supple.     Right lower leg: No edema.     Left lower leg: No edema.     Comments: Patient has good distal strength without clonus.  Skin:    Findings: No erythema or rash.  Neurological:     General: No focal deficit present.     Mental Status: He is alert and oriented to person, place, and time.     Sensory: No sensory deficit.     Motor: No weakness or abnormal muscle tone.      Coordination: Coordination normal.  Psychiatric:        Mood and Affect: Mood normal.        Behavior: Behavior normal.      Imaging: No results found.

## 2023-04-12 NOTE — Procedures (Signed)
 Lumbosacral Transforaminal Epidural Steroid Injection - Sub-Pedicular Approach with Fluoroscopic Guidance  Patient: Jason Rogers      Date of Birth: [birth year] MRN: [id number] PCP: Lavell Bari LABOR, FNP      Visit Date: 04/04/2023   Universal Protocol:    Date/Time: 04/04/2023  Consent Given By: the patient  Position: PRONE  Additional Comments: Vital signs were monitored before and after the procedure. Patient was prepped and draped in the usual sterile fashion. The correct patient, procedure, and site was verified.   Injection Procedure Details:   Procedure diagnoses: Lumbar radiculopathy [M54.16]    Meds Administered:  Meds ordered this encounter  Medications   methylPREDNISolone  acetate (DEPO-MEDROL ) injection 40 mg    Laterality: Right  Location/Site: L5  Needle:5.0 in., 22 ga.  Short bevel or Quincke spinal needle  Needle Placement: Transforaminal  Findings:    -Comments: Excellent flow of contrast along the nerve, nerve root and into the epidural space.  Procedure Details: After squaring off the end-plates to get a true AP view, the C-arm was positioned so that an oblique view of the foramen as noted above was visualized. The target area is just inferior to the nose of the scotty dog or sub pedicular. The soft tissues overlying this structure were infiltrated with 2-3 ml. of 1% Lidocaine  without Epinephrine.  The spinal needle was inserted toward the target using a trajectory view along the fluoroscope beam.  Under AP and lateral visualization, the needle was advanced so it did not puncture dura and was located close the 6 O'Clock position of the pedical in AP tracterory. Biplanar projections were used to confirm position. Aspiration was confirmed to be negative for CSF and/or blood. A 1-2 ml. volume of Isovue -250 was injected and flow of contrast was noted at each level. Radiographs were obtained for documentation purposes.   After attaining the desired  flow of contrast documented above, a 0.5 to 1.0 ml test dose of 0.25% Marcaine  was injected into each respective transforaminal space.  The patient was observed for 90 seconds post injection.  After no sensory deficits were reported, and normal lower extremity motor function was noted,   the above injectate was administered so that equal amounts of the injectate were placed at each foramen (level) into the transforaminal epidural space.   Additional Comments:  No complications occurred Dressing: 2 x 2 sterile gauze and Band-Aid    Post-procedure details: Patient was observed during the procedure. Post-procedure instructions were reviewed.  Patient left the clinic in stable condition.

## 2023-04-18 ENCOUNTER — Ambulatory Visit: Payer: Self-pay | Admitting: Family

## 2023-04-18 NOTE — Telephone Encounter (Signed)
  Chief Complaint: Rash-Pain Symptoms: fluctuating pain to rash under right breast  Frequency: Started in Jan Pertinent Negatives: Patient denies fever Disposition: [] ED /[] Urgent Care (no appt availability in office) / [x] Appointment(In office/virtual)/ []  [facility]/ [] Home Care/ [] Refused Recommended Disposition /[] [facility]/ []  Follow-up with PCP Additional Notes: patient called with concerns for rash under right breast. Patient endorses fluctuating pain from 4 out of 10 to 10 out of 10. Patient is concerned about pain and questioning what is going on with him. Patient's wife states she feels like he keeps it inside of how bad the pain is and then he suddenly tells he is in a lot of pain. Per protocol, recommendation is for an appointment. Acute appointment made with PCP tomorrow with another provider at 2:05 pm. If possible, patient and wife would like to be switched to PCP schedule. Patient and wife verbalized understanding of the plan and all questions answered.   Copied from CRM [id number]. Topic: Clinical - Medication Question >> Apr 18, 2023  5:12 PM Alessandra Bevels wrote: Reason for CRM: Patient's wife is calling to report that the patient is continuing to have pain right breast and tender. Been going on for over a week and a half. Evaluated 04/02/23 with no medication prescribed. Patient reporting at this time pain level 3-4/10 can be upwards on 10/10. Please advise Reason for Disposition  [1] Localized rash is very painful AND [2] no fever  Answer Assessment - Initial Assessment Questions 1. APPEARANCE of RASH: "Describe the rash."      Red rash under right breast 2. LOCATION: "Where is the rash located?"      Right breast 3. NUMBER: "How many spots are there?"      unknown 4. SIZE: "How big are the spots?" (Inches, centimeters or compare to size of a coin)      small 5. ONSET: "When did the rash start?"      Started back in January 6. ITCHING: "Does the rash  itch?" If Yes, ask: "How bad is the itch?"  (Scale 0-10; or none, mild, moderate, severe)     No 7. PAIN: "Does the rash hurt?" If Yes, ask: "How bad is the pain?"  (Scale 0-10; or none, mild, moderate, severe)    - NONE (0): no pain    - MILD (1-3): doesn't interfere with normal activities     - MODERATE (4-7): interferes with normal activities or awakens from sleep     - SEVERE (8-10): excruciating pain, unable to do any normal activities     Ranges from 4/10 to 10/10 8. OTHER SYMPTOMS: "Do you have any other symptoms?" (e.g., fever)     No  Protocols used: Rash or Redness - Localized-A-AH

## 2023-04-19 ENCOUNTER — Ambulatory Visit: Payer: Medicare HMO | Admitting: Family Medicine

## 2023-04-19 ENCOUNTER — Encounter: Payer: Self-pay | Admitting: Family Medicine

## 2023-04-19 ENCOUNTER — Ambulatory Visit: Payer: Medicare HMO | Attending: Cardiovascular Disease | Admitting: Cardiovascular Disease

## 2023-04-19 ENCOUNTER — Encounter: Payer: Self-pay | Admitting: Cardiovascular Disease

## 2023-04-19 VITALS — BP 153/82 | HR 74 | Temp 98.2°F | Ht 72.0 in | Wt 228.0 lb

## 2023-04-19 VITALS — BP 132/80 | HR 77 | Ht 72.0 in | Wt 228.6 lb

## 2023-04-19 DIAGNOSIS — R21 Rash and other nonspecific skin eruption: Secondary | ICD-10-CM

## 2023-04-19 DIAGNOSIS — R1011 Right upper quadrant pain: Secondary | ICD-10-CM | POA: Diagnosis not present

## 2023-04-19 DIAGNOSIS — I2510 Atherosclerotic heart disease of native coronary artery without angina pectoris: Secondary | ICD-10-CM

## 2023-04-19 DIAGNOSIS — I152 Hypertension secondary to endocrine disorders: Secondary | ICD-10-CM

## 2023-04-19 DIAGNOSIS — R079 Chest pain, unspecified: Secondary | ICD-10-CM

## 2023-04-19 DIAGNOSIS — E782 Mixed hyperlipidemia: Secondary | ICD-10-CM | POA: Diagnosis not present

## 2023-04-19 DIAGNOSIS — R935 Abnormal findings on diagnostic imaging of other abdominal regions, including retroperitoneum: Secondary | ICD-10-CM

## 2023-04-19 DIAGNOSIS — I10 Essential (primary) hypertension: Secondary | ICD-10-CM

## 2023-04-19 DIAGNOSIS — E1159 Type 2 diabetes mellitus with other circulatory complications: Secondary | ICD-10-CM | POA: Diagnosis not present

## 2023-04-19 DIAGNOSIS — Z7984 Long term (current) use of oral hypoglycemic drugs: Secondary | ICD-10-CM | POA: Diagnosis not present

## 2023-04-19 NOTE — Progress Notes (Signed)
 Subjective:  Patient ID: Jason Rogers, male    DOB: 06-14-1955, 68 y.o.   MRN: [id number]  Patient Care Team: Junie Spencer, FNP as PCP - General (Family Medicine) Wendall Stade, MD as PCP - Cardiology (Cardiology) Patty, A. Azucena Kuba, MD (Ophthalmology) Wendall Stade, MD as Consulting Physician (Cardiology) Solum, Marlana Salvage, MD as Physician Assistant (Endocrinology) Aris Lot, MD as Consulting Physician (Dermatology) Newman Pies, MD as Consulting Physician (Otolaryngology) Jaci Standard, MD as Consulting Physician (Hematology and Oncology)   Chief Complaint:  Rash  HPI: BREYLEN AGYEMAN is a 68 y.o. male presenting on 04/19/2023 for Rash  States that he has "spot" on his stomach, believed to be shingles. States that it is still painful. Started ~7 weeks ago. He was seen by PCP at [facility] on 04/02/23, did not start on valtrex at that time. Reports abdominal pain that he does not feel is related to rash. He feels that his abdomen is swollen. Denies constipation, fever, n/v, jaundice. Denies history of cirrhosis and fatty liver.   Relevant past medical, surgical, family, and social history reviewed and updated as indicated.  Allergies and medications reviewed and updated. Data reviewed: Chart in Epic.   Past Medical History:  Diagnosis Date   Allergy    Diabetes mellitus without complication (HCC)    Hyperlipidemia    Hypertension     Past Surgical History:  Procedure Laterality Date   HERNIA REPAIR     TESTICLE SURGERY     TONSILLECTOMY AND ADENOIDECTOMY     VASECTOMY      Social History   Socioeconomic History   Marital status: Married    Spouse name: Kirt Boys   Number of children: Not on file   Years of education: Not on file   Highest education level: Not on file  Occupational History    Comment: loads steel 3x per week  Tobacco Use   Smoking status: Never   Smokeless tobacco: Never  Vaping Use   Vaping status: Never Used  Substance and Sexual Activity    Alcohol use: Yes    Comment: rare   Drug use: No   Sexual activity: Not on file  Other Topics Concern   Not on file  Social History Narrative   Not on file   Social Drivers of Health   Financial Resource Strain: Low Risk  (03/30/2022)   Overall Financial Resource Strain (CARDIA)    Difficulty of Paying Living Expenses: Not hard at all  Food Insecurity: No Food Insecurity (03/30/2022)   Hunger Vital Sign    Worried About Running Out of Food in the Last Year: Never true    Ran Out of Food in the Last Year: Never true  Transportation Needs: No Transportation Needs (03/30/2022)   PRAPARE - Administrator, Civil Service (Medical): No    Lack of Transportation (Non-Medical): No  Physical Activity: Sufficiently Active (03/30/2022)   Exercise Vital Sign    Days of Exercise per Week: 7 days    Minutes of Exercise per Session: 30 min  Stress: No Stress Concern Present (03/30/2022)   [employer] of Occupational Health - Occupational Stress Questionnaire    Feeling of Stress : Not at all  Social Connections: Moderately Isolated (03/30/2022)   Social Connection and Isolation Panel [NHANES]    Frequency of Communication with Friends and Family: More than three times a week    Frequency of Social Gatherings with Friends and Family: More than  three times a week    Attends Religious Services: Never    Active Member of Clubs or Organizations: No    Attends Banker Meetings: Never    Marital Status: Married  Catering manager Violence: Not At Risk (03/30/2022)   Humiliation, Afraid, Rape, and Kick questionnaire    Fear of Current or Ex-Partner: No    Emotionally Abused: No    Physically Abused: No    Sexually Abused: No    Outpatient Encounter Medications as of 04/19/2023  Medication Sig   ascorbic acid (VITAMIN C) 500 MG tablet Take by mouth.   aspirin 81 MG tablet Take 81 mg by mouth daily.   benzonatate (TESSALON) 200 MG capsule Take 1 capsule (200 mg total) by mouth  3 (three) times daily as needed.   cholecalciferol (VITAMIN D3) 25 MCG (1000 UNIT) tablet Take 1,000 Units by mouth daily.   dapagliflozin propanediol (FARXIGA) 10 MG TABS tablet Take 1 tablet (10 mg total) by mouth daily before breakfast.   Dulaglutide 3 MG/0.5ML SOPN Inject into the skin.   FEROSUL 325 (65 Fe) MG tablet TAKE 1 TABLET BY MOUTH AT BEDTIME   Insulin Glargine (BASAGLAR KWIKPEN) 100 UNIT/ML Inject into the skin daily.   lisinopril (ZESTRIL) 40 MG tablet Take 1 tablet (40 mg total) by mouth daily.   loratadine (CLARITIN) 10 MG tablet Take 1 tablet (10 mg total) by mouth daily.   meloxicam (MOBIC) 15 MG tablet Take 1 tablet by mouth once daily   metFORMIN (GLUCOPHAGE-XR) 500 MG 24 hr tablet Take 4 tablets (2,000 mg total) by mouth daily with breakfast. TAKE 4 TABLETS BY MOUTH ONCE DAILY WITH BREAKFAST   metoprolol succinate (TOPROL-XL) 25 MG 24 hr tablet Take 1 tablet by mouth once daily   Multiple Vitamin (MULTI-VITAMIN) tablet Take 1 tablet by mouth daily.   omeprazole (PRILOSEC) 20 MG capsule Take 1 capsule by mouth once daily   rosuvastatin (CRESTOR) 20 MG tablet Take 1 tablet (20 mg total) by mouth at bedtime.   nitroGLYCERIN (NITROSTAT) 0.4 MG SL tablet Place 1 tablet (0.4 mg total) under the tongue every 5 (five) minutes as needed for chest pain.   [DISCONTINUED] diclofenac Sodium (VOLTAREN) 1 % GEL Apply 2 g topically 4 (four) times daily.   [DISCONTINUED] predniSONE (STERAPRED UNI-PAK 21 TAB) 10 MG (21) TBPK tablet Use as directed   No facility-administered encounter medications on file as of 04/19/2023.    Allergies  Allergen Reactions   Lipitor [Atorvastatin]     Depressed    Review of Systems As per HPI  Objective:  BP (!) 153/82   Pulse 74   Temp 98.2 F (36.8 C)   Ht 6' (1.829 m)   Wt 228 lb (103.4 kg)   SpO2 97%   BMI 30.92 kg/m    Wt Readings from Last 3 Encounters:  04/19/23 228 lb (103.4 kg)  04/19/23 228 lb 9.6 oz (103.7 kg)  04/02/23 230 lb  3.2 oz (104.4 kg)   Physical Exam Constitutional:      General: He is awake. He is not in acute distress.    Appearance: Normal appearance. He is well-developed and well-groomed. He is not ill-appearing, toxic-appearing or diaphoretic.  Cardiovascular:     Rate and Rhythm: Normal rate and regular rhythm.     Pulses: Normal pulses.          Radial pulses are 2+ on the right side and 2+ on the left side.  Posterior tibial pulses are 2+ on the right side and 2+ on the left side.     Heart sounds: Normal heart sounds. No murmur heard.    No gallop.  Pulmonary:     Effort: Pulmonary effort is normal. No respiratory distress.     Breath sounds: Normal breath sounds. No stridor. No wheezing, rhonchi or rales.  Abdominal:     General: Bowel sounds are normal. There is distension.     Palpations: There is hepatomegaly.     Tenderness: There is no abdominal tenderness.     Comments: Large linear protrusion at midline of abdomen, swelling RUQ, soft, reducible, non tender to palpation   Musculoskeletal:     Cervical back: Full passive range of motion without pain and neck supple.     Right lower leg: No edema.     Left lower leg: No edema.  Skin:    General: Skin is warm.     Capillary Refill: Capillary refill takes less than 2 seconds.     Findings: Rash present.     Comments: Small, erythematous, petechial rash on RUQ in linear pattern   Neurological:     General: No focal deficit present.     Mental Status: He is alert, oriented to person, place, and time and easily aroused. Mental status is at baseline.     GCS: GCS eye subscore is 4. GCS verbal subscore is 5. GCS motor subscore is 6.     Motor: No weakness.  Psychiatric:        Attention and Perception: Attention and perception normal.        Mood and Affect: Mood and affect normal.        Speech: Speech normal.        Behavior: Behavior normal. Behavior is cooperative.        Thought Content: Thought content normal. Thought  content does not include homicidal or suicidal ideation. Thought content does not include homicidal or suicidal plan.        Cognition and Memory: Cognition and memory normal.        Judgment: Judgment normal.     Results for orders placed or performed in visit on 11/30/22  Bayer DCA Hb A1c Waived   Collection Time: 11/30/22  8:55 AM  Result Value Ref Range   HB A1C (BAYER DCA - WAIVED) 6.6 (H) 4.8 - 5.6 %  Microalbumin / creatinine urine ratio   Collection Time: 11/30/22  8:55 AM  Result Value Ref Range   Creatinine, Urine 114.8 Not Estab. mg/dL   Microalbumin, Urine 7.0 Not Estab. ug/mL   Microalb/Creat Ratio 6 0 - 29 mg/g creat  CMP14+EGFR   Collection Time: 11/30/22  8:57 AM  Result Value Ref Range   Glucose 125 (H) 70 - 99 mg/dL   BUN 13 8 - 27 mg/dL   Creatinine, Ser 1.61 0.76 - 1.27 mg/dL   eGFR 95 >09 UE/AVW/0.98   BUN/Creatinine Ratio 16 10 - 24   Sodium 142 134 - 144 mmol/L   Potassium 4.2 3.5 - 5.2 mmol/L   Chloride 104 96 - 106 mmol/L   CO2 23 20 - 29 mmol/L   Calcium 8.8 8.6 - 10.2 mg/dL   Total Protein 5.9 (L) 6.0 - 8.5 g/dL   Albumin 4.1 3.9 - 4.9 g/dL   Globulin, Total 1.8 1.5 - 4.5 g/dL   Bilirubin Total 1.6 (H) 0.0 - 1.2 mg/dL   Alkaline Phosphatase 48 44 - 121 IU/L   AST  21 0 - 40 IU/L   ALT 18 0 - 44 IU/L  CBC with Differential/Platelet   Collection Time: 11/30/22  8:57 AM  Result Value Ref Range   WBC 6.5 3.4 - 10.8 x10E3/uL   RBC 5.04 4.14 - 5.80 x10E6/uL   Hemoglobin 14.4 13.0 - 17.7 g/dL   Hematocrit 16.1 09.6 - 51.0 %   MCV 90 79 - 97 fL   MCH 28.6 26.6 - 33.0 pg   MCHC 31.9 31.5 - 35.7 g/dL   RDW 04.5 40.9 - 81.1 %   Platelets 177 150 - 450 x10E3/uL   Neutrophils 67 Not Estab. %   Lymphs 19 Not Estab. %   Monocytes 12 Not Estab. %   Eos 1 Not Estab. %   Basos 1 Not Estab. %   Neutrophils Absolute 4.3 1.4 - 7.0 x10E3/uL   Lymphocytes Absolute 1.2 0.7 - 3.1 x10E3/uL   Monocytes Absolute 0.8 0.1 - 0.9 x10E3/uL   EOS (ABSOLUTE) 0.1 0.0 -  0.4 x10E3/uL   Basophils Absolute 0.1 0.0 - 0.2 x10E3/uL   Immature Granulocytes 0 Not Estab. %   Immature Grans (Abs) 0.0 0.0 - 0.1 x10E3/uL  Lipid panel   Collection Time: 11/30/22  8:57 AM  Result Value Ref Range   Cholesterol, Total 87 (L) 100 - 199 mg/dL   Triglycerides 914 (H) 0 - 149 mg/dL   HDL 37 (L) >78 mg/dL   VLDL Cholesterol Cal 28 5 - 40 mg/dL   LDL Chol Calc (NIH) 22 0 - 99 mg/dL   Chol/HDL Ratio 2.4 0.0 - 5.0 ratio  TSH   Collection Time: 11/30/22  8:57 AM  Result Value Ref Range   TSH 4.300 0.450 - 4.500 uIU/mL  PSA, total and free   Collection Time: 11/30/22  8:57 AM  Result Value Ref Range   Prostate Specific Ag, Serum 2.6 0.0 - 4.0 ng/mL   PSA, Free 0.72 N/A ng/mL   PSA, Free Pct 27.7 %  Iron, TIBC and Ferritin Panel   Collection Time: 11/30/22  8:57 AM  Result Value Ref Range   Total Iron Binding Capacity 326 250 - 450 ug/dL   UIBC 295 621 - 308 ug/dL   Iron 58 38 - 657 ug/dL   Iron Saturation 18 15 - 55 %   Ferritin 38 30 - 400 ng/mL       04/19/2023    2:16 PM 11/30/2022    8:04 AM 07/03/2022    3:42 PM 05/29/2022    3:23 PM 03/30/2022   11:57 AM  Depression screen PHQ 2/9  Decreased Interest 0 0 0 0 0  Down, Depressed, Hopeless 0 0 0 0 0  PHQ - 2 Score 0 0 0 0 0  Altered sleeping 0  0    Tired, decreased energy 0  0    Change in appetite 0  0    Feeling bad or failure about yourself  0  0    Trouble concentrating 0  0    Moving slowly or fidgety/restless 0  0    Suicidal thoughts 0  0    PHQ-9 Score 0  0    Difficult doing work/chores Not difficult at all  Not difficult at all         04/19/2023    2:16 PM 07/03/2022    3:42 PM 11/11/2020    8:24 AM  GAD 7 : Generalized Anxiety Score  Nervous, Anxious, on Edge 0 0 0  Control/stop worrying 0  0 0  Worry too much - different things 0 0 0  Trouble relaxing 0 0 0  Restless 0 0 0  Easily annoyed or irritable 0 0 0  Afraid - awful might happen 0 0 0  Total GAD 7 Score 0 0 0  Anxiety Difficulty  Not difficult at all Not difficult at all    Pertinent labs & imaging results that were available during my care of the patient were reviewed by me and considered in my medical decision making.  Assessment & Plan:  Prayan was seen today for rash.  Diagnoses and all orders for this visit:  Right upper quadrant abdominal pain Labs and imaging as below. Will communicate results to patient once available. Will await results to determine next steps. Concern for hernia vs abdominal rectus diastasis  -     CMP14+EGFR -     US Abdomen Complete; Future  Rash Discussed with patient topical treatments and oral analgesics. Concern for atopic dermatitis vs shingles. Labs as below. Will communicate results to patient once available. Will await results to determine next steps.  -     CBC with Differential/Platelet  Hypertension associated with diabetes (HCC) Patient to monitor at home and follow up with PCP   Continue all other maintenance medications.  Follow up plan: Return if symptoms worsen or fail to improve.  Continue healthy lifestyle choices, including diet (rich in fruits, vegetables, and lean proteins, and low in salt and simple carbohydrates) and exercise (at least 30 minutes of moderate physical activity daily).  Written and verbal instructions provided   The above assessment and management plan was discussed with the patient. The patient verbalized understanding of and has agreed to the management plan. Patient is aware to call the clinic if they develop any new symptoms or if symptoms persist or worsen. Patient is aware when to return to the clinic for a follow-up visit. Patient educated on when it is appropriate to go to the emergency department.   Neale Burly, DNP-FNP [facility] [address] [place], [place] [address] [phone]

## 2023-04-19 NOTE — Patient Instructions (Signed)

## 2023-04-20 LAB — CBC WITH DIFFERENTIAL/PLATELET
Basophils Absolute: 0.1 10*3/uL (ref 0.0–0.2)
Basos: 1 %
EOS (ABSOLUTE): 0.1 10*3/uL (ref 0.0–0.4)
Eos: 1 %
Hematocrit: 46.2 % (ref 37.5–51.0)
Hemoglobin: 15.1 g/dL (ref 13.0–17.7)
Immature Grans (Abs): 0 10*3/uL (ref 0.0–0.1)
Immature Granulocytes: 0 %
Lymphocytes Absolute: 1.9 10*3/uL (ref 0.7–3.1)
Lymphs: 25 %
MCH: 28.4 pg (ref 26.6–33.0)
MCHC: 32.7 g/dL (ref 31.5–35.7)
MCV: 87 fL (ref 79–97)
Monocytes Absolute: 0.6 10*3/uL (ref 0.1–0.9)
Monocytes: 8 %
Neutrophils Absolute: 4.9 10*3/uL (ref 1.4–7.0)
Neutrophils: 65 %
Platelets: 200 10*3/uL (ref 150–450)
RBC: 5.31 x10E6/uL (ref 4.14–5.80)
RDW: 13.5 % (ref 11.6–15.4)
WBC: 7.6 10*3/uL (ref 3.4–10.8)

## 2023-04-20 LAB — CMP14+EGFR
ALT: 25 IU/L (ref 0–44)
AST: 21 IU/L (ref 0–40)
Albumin: 4.3 g/dL (ref 3.9–4.9)
Alkaline Phosphatase: 52 IU/L (ref 44–121)
BUN/Creatinine Ratio: 13 (ref 10–24)
BUN: 13 mg/dL (ref 8–27)
Bilirubin Total: 1.5 mg/dL — ABNORMAL HIGH (ref 0.0–1.2)
CO2: 24 mmol/L (ref 20–29)
Calcium: 9.2 mg/dL (ref 8.6–10.2)
Chloride: 104 mmol/L (ref 96–106)
Creatinine, Ser: 1 mg/dL (ref 0.76–1.27)
Globulin, Total: 2.2 g/dL (ref 1.5–4.5)
Glucose: 131 mg/dL — ABNORMAL HIGH (ref 70–99)
Potassium: 4.4 mmol/L (ref 3.5–5.2)
Sodium: 143 mmol/L (ref 134–144)
Total Protein: 6.5 g/dL (ref 6.0–8.5)
eGFR: 81 mL/min/{1.73_m2} (ref >59)

## 2023-04-22 ENCOUNTER — Encounter: Payer: Self-pay | Admitting: Family Medicine

## 2023-04-22 NOTE — Progress Notes (Signed)
 Labs stable. Will await for imaging results.

## 2023-04-29 ENCOUNTER — Ambulatory Visit (HOSPITAL_COMMUNITY)
Admission: RE | Admit: 2023-04-29 | Discharge: 2023-04-29 | Disposition: A | Discharge status: Home / Self Care | Payer: Medicare HMO | Source: Ambulatory Visit | Attending: Family Medicine | Admitting: Family Medicine

## 2023-04-29 DIAGNOSIS — R1011 Right upper quadrant pain: Secondary | ICD-10-CM | POA: Insufficient documentation

## 2023-04-29 DIAGNOSIS — R1900 Intra-abdominal and pelvic swelling, mass and lump, unspecified site: Secondary | ICD-10-CM | POA: Diagnosis not present

## 2023-05-02 ENCOUNTER — Telehealth: Payer: Self-pay | Admitting: Family

## 2023-05-02 ENCOUNTER — Ambulatory Visit: Payer: Medicare HMO | Admitting: Cardiovascular Disease

## 2023-05-02 NOTE — Telephone Encounter (Signed)
 Copied from CRM [id number]. Topic: Clinical - Lab/Test Results >> May 02, 2023  2:22 PM Clayton Bibles wrote: Reason for CRM: Vinal is calling to see if results for US Abdomen Complete are back and when will they be ready.    Please call back at [phone] >> May 02, 2023  2:28 PM Clayton Bibles wrote: I did let him know the results are show not released

## 2023-05-06 ENCOUNTER — Encounter: Payer: Self-pay | Admitting: Family Medicine

## 2023-05-06 DIAGNOSIS — K760 Fatty (change of) liver, not elsewhere classified: Secondary | ICD-10-CM

## 2023-05-06 DIAGNOSIS — K7469 Other cirrhosis of liver: Secondary | ICD-10-CM

## 2023-05-07 ENCOUNTER — Encounter: Payer: Self-pay | Admitting: Family Medicine

## 2023-05-07 NOTE — Addendum Note (Signed)
 Addended by: Neale Burly on: 05/07/2023 09:47 PM   Modules accepted: Orders

## 2023-05-07 NOTE — Progress Notes (Signed)
 Per report, possible steatosis or fatty liver found on imaging. Recommend diet and exercise. We will work to improve blood pressure control, weight, and cholesterol. Recommend avoiding liver toxins such as alcohol and medications eliminated by the liver like tylenol. Notify the office if there are worsening symptoms of liver cirrhosis such as blood in your bowel movements or vomit, symptoms of infection, belly pain, swollen legs or ankles, trouble breathing, extreme tiredness, confusion, yellowing of the skin or whites of your eyes, called jaundice. Will refer to hepatology for further evaluation.

## 2023-05-17 ENCOUNTER — Encounter: Payer: Self-pay | Admitting: Gastroenterology

## 2023-05-17 ENCOUNTER — Other Ambulatory Visit: Payer: Self-pay | Admitting: Gastroenterology

## 2023-05-17 DIAGNOSIS — R109 Unspecified abdominal pain: Secondary | ICD-10-CM

## 2023-05-20 ENCOUNTER — Ambulatory Visit
Admission: RE | Admit: 2023-05-20 | Discharge: 2023-05-20 | Disposition: A | Discharge status: Home / Self Care | Source: Ambulatory Visit | Attending: Gastroenterology | Admitting: Gastroenterology

## 2023-05-20 ENCOUNTER — Other Ambulatory Visit

## 2023-05-20 DIAGNOSIS — I700 Atherosclerosis of aorta: Secondary | ICD-10-CM | POA: Diagnosis not present

## 2023-05-20 DIAGNOSIS — R109 Unspecified abdominal pain: Secondary | ICD-10-CM | POA: Diagnosis not present

## 2023-05-20 DIAGNOSIS — K5730 Diverticulosis of large intestine without perforation or abscess without bleeding: Secondary | ICD-10-CM | POA: Diagnosis not present

## 2023-05-20 DIAGNOSIS — K760 Fatty (change of) liver, not elsewhere classified: Secondary | ICD-10-CM | POA: Diagnosis not present

## 2023-05-20 MED ORDER — IOPAMIDOL (ISOVUE-300) INJECTION 61%
100.0000 mL | Freq: Once | INTRAVENOUS | Status: AC | PRN
  Administered 2023-05-20: 100 mL via INTRAVENOUS

## 2023-05-29 ENCOUNTER — Telehealth: Payer: Self-pay | Admitting: Family

## 2023-05-31 ENCOUNTER — Ambulatory Visit: Payer: Medicare HMO | Admitting: Family

## 2023-06-06 ENCOUNTER — Other Ambulatory Visit: Payer: Self-pay | Admitting: Family

## 2023-06-06 DIAGNOSIS — E611 Iron deficiency: Secondary | ICD-10-CM

## 2023-06-07 ENCOUNTER — Ambulatory Visit: Payer: Medicare HMO

## 2023-06-07 ENCOUNTER — Ambulatory Visit (INDEPENDENT_AMBULATORY_CARE_PROVIDER_SITE_OTHER)

## 2023-06-07 VITALS — Ht 72.0 in | Wt 225.0 lb

## 2023-06-07 DIAGNOSIS — Z0001 Encounter for general adult medical examination with abnormal findings: Secondary | ICD-10-CM | POA: Diagnosis not present

## 2023-06-07 DIAGNOSIS — Z7985 Long-term (current) use of injectable non-insulin antidiabetic drugs: Secondary | ICD-10-CM

## 2023-06-07 DIAGNOSIS — E1169 Type 2 diabetes mellitus with other specified complication: Secondary | ICD-10-CM | POA: Diagnosis not present

## 2023-06-07 DIAGNOSIS — E1165 Type 2 diabetes mellitus with hyperglycemia: Secondary | ICD-10-CM | POA: Diagnosis not present

## 2023-06-07 DIAGNOSIS — Z0000 Encounter for general adult medical examination without abnormal findings: Secondary | ICD-10-CM

## 2023-06-07 DIAGNOSIS — E119 Type 2 diabetes mellitus without complications: Secondary | ICD-10-CM

## 2023-06-07 DIAGNOSIS — Z7984 Long term (current) use of oral hypoglycemic drugs: Secondary | ICD-10-CM

## 2023-06-07 NOTE — Progress Notes (Signed)
 Because this visit was a virtual/telehealth visit,  certain criteria was not obtained, such a blood pressure, CBG if applicable, and timed get up and go. Any medications not marked as "taking" were not mentioned during the medication reconciliation part of the visit. Any vitals not documented were not able to be obtained due to this being a telehealth visit or patient was unable to self-report a recent blood pressure reading due to a lack of equipment at home via telehealth. Vitals that have been documented are verbally provided by the patient.   Subjective:   Jason Rogers is a 68 y.o. who presents for a Medicare Wellness preventive visit.  Visit Complete: Virtual I connected with  Jason Rogers on 06/07/23 by a audio enabled telemedicine application and verified that I am speaking with the correct person using two identifiers.  Patient Location: Home  Provider Location: Home Office  I discussed the limitations of evaluation and management by telemedicine. The patient expressed understanding and agreed to proceed.  Vital Signs: Because this visit was a virtual/telehealth visit, some criteria may be missing or patient reported. Any vitals not documented were not able to be obtained and vitals that have been documented are patient reported.  VideoDeclined- This patient declined Librarian, academic. Therefore the visit was completed with audio only.  Persons Participating in Visit: Patient.  AWV Questionnaire: No: Patient Medicare AWV questionnaire was not completed prior to this visit.  Cardiac Risk Factors include: advanced age (>73men, >23 women);diabetes mellitus;dyslipidemia;hypertension;male gender;obesity (BMI >30kg/m2)     Objective:    Today's Vitals   06/07/23 1058 06/07/23 1100  Weight: 225 lb (102.1 kg)   Height: 6' (1.829 m)   PainSc:  5    Body mass index is 30.52 kg/m.     06/07/2023   11:04 AM 03/30/2022   11:58 AM 09/23/2020    2:54  PM 05/15/2019    9:01 AM 03/29/2017   10:47 PM  Advanced Directives  Does Patient Have a Medical Advance Directive? No Yes No Yes No  Type of Special educational needs teacher of [employer];Living will  Healthcare Power of [place];Living will   Copy of Healthcare Power of Attorney in Chart?  No - copy requested  No - copy requested   Would patient like information on creating a medical advance directive? No - Patient declined  No - Patient declined      Current Medications (verified) Outpatient Encounter Medications as of 06/07/2023  Medication Sig   ascorbic acid (VITAMIN C) 500 MG tablet Take by mouth.   aspirin 81 MG tablet Take 81 mg by mouth daily.   benzonatate (TESSALON) 200 MG capsule Take 1 capsule (200 mg total) by mouth 3 (three) times daily as needed.   cholecalciferol (VITAMIN D3) 25 MCG (1000 UNIT) tablet Take 1,000 Units by mouth daily.   dapagliflozin propanediol (FARXIGA) 10 MG TABS tablet Take 1 tablet (10 mg total) by mouth daily before breakfast.   Dulaglutide 3 MG/0.5ML SOPN Inject into the skin.   Ferrous Sulfate (IRON) 325 (65 Fe) MG TABS TAKE 1 TABLET BY MOUTH AT BEDTIME   Insulin Glargine (BASAGLAR KWIKPEN) 100 UNIT/ML Inject into the skin daily.   lisinopril (ZESTRIL) 40 MG tablet Take 1 tablet (40 mg total) by mouth daily.   loratadine (CLARITIN) 10 MG tablet Take 1 tablet (10 mg total) by mouth daily.   meloxicam (MOBIC) 15 MG tablet Take 1 tablet by mouth once daily   metFORMIN (GLUCOPHAGE-XR) 500 MG  24 hr tablet Take 4 tablets (2,000 mg total) by mouth daily with breakfast. TAKE 4 TABLETS BY MOUTH ONCE DAILY WITH BREAKFAST   metoprolol succinate (TOPROL-XL) 25 MG 24 hr tablet Take 1 tablet by mouth once daily   Multiple Vitamin (MULTI-VITAMIN) tablet Take 1 tablet by mouth daily.   nitroGLYCERIN (NITROSTAT) 0.4 MG SL tablet Place 1 tablet (0.4 mg total) under the tongue every 5 (five) minutes as needed for chest pain.   omeprazole (PRILOSEC) 20 MG capsule Take 1  capsule by mouth once daily   rosuvastatin (CRESTOR) 20 MG tablet Take 1 tablet (20 mg total) by mouth at bedtime.   No facility-administered encounter medications on file as of 06/07/2023.    Allergies (verified) Lipitor [atorvastatin]   History: Past Medical History:  Diagnosis Date   Allergy    Diabetes mellitus without complication (HCC)    Hyperlipidemia    Hypertension    Past Surgical History:  Procedure Laterality Date   HERNIA REPAIR     TESTICLE SURGERY     TONSILLECTOMY AND ADENOIDECTOMY     VASECTOMY     Family History  Problem Relation Age of Onset   Diabetes Mother    Heart disease Father    Social History   Socioeconomic History   Marital status: Married    Spouse name: Kirt Boys   Number of children: Not on file   Years of education: Not on file   Highest education level: Not on file  Occupational History    Comment: loads steel 3x per week  Tobacco Use   Smoking status: Never   Smokeless tobacco: Never  Vaping Use   Vaping status: Never Used  Substance and Sexual Activity   Alcohol use: Yes    Comment: rare   Drug use: No   Sexual activity: Not on file  Other Topics Concern   Not on file  Social History Narrative   Not on file   Social Drivers of Health   Financial Resource Strain: Low Risk  (06/07/2023)   Overall Financial Resource Strain (CARDIA)    Difficulty of Paying Living Expenses: Not hard at all  Food Insecurity: No Food Insecurity (06/07/2023)   Hunger Vital Sign    Worried About Running Out of Food in the Last Year: Never true    Ran Out of Food in the Last Year: Never true  Transportation Needs: No Transportation Needs (06/07/2023)   PRAPARE - Administrator, Civil Service (Medical): No    Lack of Transportation (Non-Medical): No  Physical Activity: Sufficiently Active (06/07/2023)   Exercise Vital Sign    Days of Exercise per Week: 7 days    Minutes of Exercise per Session: 30 min  Stress: No Stress Concern Present  (06/07/2023)   [employer] of Occupational Health - Occupational Stress Questionnaire    Feeling of Stress : Not at all  Social Connections: Moderately Integrated (06/07/2023)   Social Connection and Isolation Panel [NHANES]    Frequency of Communication with Friends and Family: More than three times a week    Frequency of Social Gatherings with Friends and Family: More than three times a week    Attends Religious Services: More than 4 times per year    Active Member of [employer] or Organizations: No    Attends Banker Meetings: Never    Marital Status: Married    Tobacco Counseling Counseling given: Yes    Clinical Intake:  Pre-visit preparation completed: Yes  Pain :  0-10 Pain Score: 5  Pain Type: Acute pain (patient has been moving furniture from [place] to [place] after a death in the family.) Pain Location: Leg Pain Orientation: Right, Left Pain Descriptors / Indicators: Aching Pain Onset: In the past 7 days Pain Frequency: Constant (pain/soreness is due to moving furniture)     BMI - recorded: 30.52 Nutritional Status: BMI > 30  Obese Nutritional Risks: None Diabetes: Yes CBG done?: No (telehealth visit. unable to obtain cbg) Did pt. bring in CBG monitor from home?: No  Lab Results  Component Value Date   HGBA1C 6.6 (H) 11/30/2022   HGBA1C 6.7 (H) 05/14/2022   HGBA1C 7.1 (H) 11/13/2021     How often do you need to have someone help you when you read instructions, pamphlets, or other written materials from your doctor or pharmacy?: 1 - Never  Interpreter Needed?: No  Information entered by :: Maryjean Ka CMA   Activities of Daily Living     06/07/2023   11:03 AM  In your present state of health, do you have any difficulty performing the following activities:  Hearing? 0  Vision? 0  Difficulty concentrating or making decisions? 0  Walking or climbing stairs? 0  Dressing or bathing? 0  Doing errands, shopping? 0  Preparing Food and eating ? N   Using the Toilet? N  In the past six months, have you accidently leaked urine? N  Do you have problems with loss of bowel control? N  Managing your Medications? N  Managing your Finances? N  Housekeeping or managing your Housekeeping? N    Patient Care Team: Junie Spencer, FNP as PCP - General (Family Medicine) Wendall Stade, MD as PCP - Cardiology (Cardiology) Patty, A. Azucena Kuba, MD (Ophthalmology) Wendall Stade, MD as Consulting Physician (Cardiology) Tedd Sias Marlana Salvage, MD as Physician Assistant (Endocrinology) Aris Lot, MD as Consulting Physician (Dermatology) Newman Pies, MD as Consulting Physician (Otolaryngology) Jaci Standard, MD as Consulting Physician (Hematology and Oncology)  Indicate any recent Medical Services you may have received from other than [facility] providers in the past year (date may be approximate).     Assessment:   This is a routine wellness examination for Christyan.  Hearing/Vision screen Hearing Screening - Comments:: Patient denies any hearing difficulties.   Vision Screening - Comments:: Wears rx glasses - up to date with routine eye exams  Patient [facility] in [place]    Goals Addressed             This Visit's Progress    Patient Stated       I want to rest. We've been moving stuff from [place] to down here.        Depression Screen     06/07/2023   11:08 AM 04/19/2023    2:16 PM 11/30/2022    8:04 AM 07/03/2022    3:42 PM 05/29/2022    3:23 PM 03/30/2022   11:57 AM 05/15/2021    8:07 AM  PHQ 2/9 Scores  PHQ - 2 Score 0 0 0 0 0 0 0  PHQ- 9 Score 0 0  0       Fall Risk     06/07/2023   11:06 AM 04/19/2023    2:16 PM 11/30/2022    8:04 AM 07/03/2022    3:43 PM 05/29/2022    3:23 PM  Fall Risk   Falls in the past year? 0 0 0 0 0  Number falls in  past yr: 0 0 0 0   Injury with Fall? 0 0 0 0   Risk for fall due to : No Fall Risks No Fall Risks History of fall(s) No Fall Risks   Follow up Falls prevention  discussed;Falls evaluation completed Falls evaluation completed  Falls evaluation completed     MEDICARE RISK AT HOME:  Medicare Risk at Home Any stairs in or around the home?: Yes If so, are there any without handrails?: No Home free of loose throw rugs in walkways, pet beds, electrical cords, etc?: Yes Adequate lighting in your home to reduce risk of falls?: Yes Life alert?: No Use of a cane, walker or w/c?: No Grab bars in the bathroom?: No Shower chair or bench in shower?: No Elevated toilet seat or a handicapped toilet?: No  TIMED UP AND GO:  Was the test performed?  No  Cognitive Function: Declined: Patient declined cognitive screening, but was able to answer questions in an accurate and timely manner. No cognitive impairments observed.        06/07/2023   11:08 AM 03/30/2022   11:58 AM 09/23/2020    2:52 PM  6CIT Screen  What Year? 0 points 0 points 0 points  What month? 0 points 0 points 0 points  What time? 0 points 0 points 0 points  Count back from 20 0 points 0 points 0 points  Months in reverse 0 points 0 points 0 points  Repeat phrase 0 points 0 points 0 points  Total Score 0 points 0 points 0 points    Immunizations Immunization History  Administered Date(s) Administered   Fluad Quad(high Dose 65+) 12/31/2019, 11/11/2020, 11/13/2021   Fluad Trivalent(High Dose 65+) 01/21/2023   Influenza,inj,Quad PF,6+ Mos 04/30/2014, 02/04/2015, 11/18/2015, 12/28/2016, 12/05/2017, 12/05/2018   Moderna SARS-COV2 Booster Vaccination 02/24/2020   Moderna Sars-Covid-2 Vaccination 05/01/2019, 06/02/2019, 10/19/2020   Pneumococcal Conjugate-13 04/30/2014, 04/03/2019   Pneumococcal Polysaccharide-23 05/13/2020   Tdap 10/28/2011, 11/13/2021   Zoster Recombinant(Shingrix) 10/10/2018, 05/15/2021   Zoster, Live 10/02/2019    Screening Tests Health Maintenance  Topic Date Due   COVID-19 Vaccine (4 - 2024-25 season) 10/28/2022   OPHTHALMOLOGY EXAM  12/23/2022   HEMOGLOBIN A1C   05/31/2023   INFLUENZA VACCINE  09/27/2023   Diabetic kidney evaluation - Urine ACR  11/30/2023   FOOT EXAM  11/30/2023   Diabetic kidney evaluation - eGFR measurement  04/18/2024   Medicare Annual Wellness (AWV)  06/06/2024   DTaP/Tdap/Td (3 - Td or Tdap) 11/14/2031   Colonoscopy  10/10/2032   Pneumonia Vaccine 71+ Years old  Completed   Hepatitis C Screening  Completed   Zoster Vaccines- Shingrix  Completed   HPV VACCINES  Aged Out   Meningococcal B Vaccine  Aged Out    Health Maintenance  Health Maintenance Due  Topic Date Due   COVID-19 Vaccine (4 - 2024-25 season) 10/28/2022   OPHTHALMOLOGY EXAM  12/23/2022   HEMOGLOBIN A1C  05/31/2023   Health Maintenance Items Addressed: A1C Last eye exam notes requested from [facility] Locations  Additional Screening:  Vision Screening: Recommended annual ophthalmology exams for early detection of glaucoma and other disorders of the eye.  Dental Screening: Recommended annual dental exams for proper oral hygiene  Community Resource Referral / Chronic Care Management: CRR required this visit?  No   CCM required this visit?  No     Plan:     I have personally reviewed and noted the following in the patient's chart:   Medical  and social history Use of alcohol, tobacco or illicit drugs  Current medications and supplements including opioid prescriptions. Patient is not currently taking opioid prescriptions. Functional ability and status Nutritional status Physical activity Advanced directives List of other physicians Hospitalizations, surgeries, and ER visits in previous 12 months Vitals Screenings to include cognitive, depression, and falls Referrals and appointments  In addition, I have reviewed and discussed with patient certain preventive protocols, quality metrics, and best practice recommendations. A written personalized care plan for preventive services as well as general preventive health  recommendations were provided to patient.     Jordan Hawks Zarria Towell, CMA   06/07/2023   After Visit Summary: (MyChart) Due to this being a telephonic visit, the after visit summary with patients personalized plan was offered to patient via MyChart   Notes: Please refer to Routing Comments.

## 2023-06-07 NOTE — Patient Instructions (Signed)
 Mr. Acklin , Thank you for taking time to come for your Medicare Wellness Visit. I appreciate your ongoing commitment to your health goals. Please review the following plan we discussed and let me know if I can assist you in the future.   Referrals/Orders/Follow-Ups/Clinician Recommendations:  Next Medicare AWV: June 12, 2024 at 8:40 am video visit.   Please have your A1C drawn when you see Jannifer Rodney, FNP on Monday. The order is already in.      This is a list of the screening recommended for you and due dates:  Health Maintenance  Topic Date Due   COVID-19 Vaccine (4 - 2024-25 season) 10/28/2022   Eye exam for diabetics  12/23/2022   Hemoglobin A1C  05/31/2023   Flu Shot  09/27/2023   Yearly kidney health urinalysis for diabetes  11/30/2023   Complete foot exam   11/30/2023   Yearly kidney function blood test for diabetes  04/18/2024   Medicare Annual Wellness Visit  06/06/2024   DTaP/Tdap/Td vaccine (3 - Td or Tdap) 11/14/2031   Colon Cancer Screening  10/10/2032   Pneumonia Vaccine  Completed   Hepatitis C Screening  Completed   Zoster (Shingles) Vaccine  Completed   HPV Vaccine  Aged Out   Meningitis B Vaccine  Aged Out    Advanced directives: (ACP Link)Information on Advanced Care Planning can be found at [facility] Advance Health Care Directives Advance Health Care Directives. [email or web address]   Next Medicare Annual Wellness Visit scheduled for next year: Yes  Understanding Your Risk for Falls  Millions of people have serious injuries from falls each year. It is important to understand your risk of falling. Talk with your health care provider about your risk and what you can do to lower it. If you do have a serious fall, make sure to tell your provider. Falling once raises your risk of falling again. How can falls affect me? Serious injuries from falls are common. These include: Broken bones, such as hip fractures. Head injuries, such as traumatic  brain injuries (TBI) or concussions. A fear of falling can cause you to avoid activities and stay at home. This can make your muscles weaker and raise your risk for a fall. What can increase my risk? There are a number of risk factors that increase your risk for falling. The more risk factors you have, the higher your risk of falling. Serious injuries from a fall happen most often to people who are older than 69 years old. Teenagers and young adults ages 53-29 are also at higher risk. Common risk factors include: Weakness in the lower body. Being generally weak or confused due to long-term (chronic) illness. Dizziness or balance problems. Poor vision. Medicines that cause dizziness or drowsiness. These may include: Medicines for your blood pressure, heart, anxiety, insomnia, or swelling (edema). Pain medicines. Muscle relaxants. Other risk factors include: Drinking alcohol. Having had a fall in the past. Having foot pain or wearing improper footwear. Working at a dangerous job. Having any of the following in your home: Tripping hazards, such as floor clutter or loose rugs. Poor lighting. Pets. Having dementia or memory loss. What actions can I take to lower my risk of falling?     Physical activity Stay physically fit. Do strength and balance exercises. Consider taking a regular class to build strength and balance. Yoga and tai chi are good options. Vision Have your eyes checked every year and your prescription for glasses or contacts updated as needed.  Shoes and walking aids Wear non-skid shoes. Wear shoes that have rubber soles and low heels. Do not wear high heels. Do not walk around the house in socks or slippers. Use a cane or walker as told by your provider. Home safety Attach secure railings on both sides of your stairs. Install grab bars for your bathtub, shower, and toilet. Use a non-skid mat in your bathtub or shower. Attach bath mats securely with double-sided,  non-slip rug tape. Use good lighting in all rooms. Keep a flashlight near your bed. Make sure there is a clear path from your bed to the bathroom. Use night-lights. Do not use throw rugs. Make sure all carpeting is taped or tacked down securely. Remove all clutter from walkways and stairways, including extension cords. Repair uneven or broken steps and floors. Avoid walking on icy or slippery surfaces. Walk on the grass instead of on icy or slick sidewalks. Use ice melter to get rid of ice on walkways in the winter. Use a cordless phone. Questions to ask your health care provider Can you help me check my risk for a fall? Do any of my medicines make me more likely to fall? Should I take a vitamin D supplement? What exercises can I do to improve my strength and balance? Should I make an appointment to have my vision checked? Do I need a bone density test to check for weak bones (osteoporosis)? Would it help to use a cane or a walker? Where to find more information Centers for Disease Control and Prevention, STEADI: TonerPromos.no Community-Based Fall Prevention Programs: TonerPromos.no [employer] on Aging: BaseRingTones.pl Contact a health care provider if: You fall at home. You are afraid of falling at home. You feel weak, drowsy, or dizzy. This information is not intended to replace advice given to you by your health care provider. Make sure you discuss any questions you have with your health care provider. Document Revised: 10/16/2021 Document Reviewed: 10/16/2021 Elsevier Patient Education  2024 [employer].

## 2023-06-10 ENCOUNTER — Encounter: Payer: Self-pay | Admitting: Family

## 2023-06-10 ENCOUNTER — Ambulatory Visit: Payer: Medicare HMO | Admitting: Family

## 2023-06-10 VITALS — BP 149/82 | HR 69 | Temp 97.0°F | Ht 72.0 in | Wt 223.8 lb

## 2023-06-10 DIAGNOSIS — Z6830 Body mass index (BMI) 30.0-30.9, adult: Secondary | ICD-10-CM

## 2023-06-10 DIAGNOSIS — K219 Gastro-esophageal reflux disease without esophagitis: Secondary | ICD-10-CM

## 2023-06-10 DIAGNOSIS — E785 Hyperlipidemia, unspecified: Secondary | ICD-10-CM

## 2023-06-10 DIAGNOSIS — E1159 Type 2 diabetes mellitus with other circulatory complications: Secondary | ICD-10-CM | POA: Diagnosis not present

## 2023-06-10 DIAGNOSIS — E669 Obesity, unspecified: Secondary | ICD-10-CM | POA: Diagnosis not present

## 2023-06-10 DIAGNOSIS — E1169 Type 2 diabetes mellitus with other specified complication: Secondary | ICD-10-CM | POA: Diagnosis not present

## 2023-06-10 DIAGNOSIS — M4726 Other spondylosis with radiculopathy, lumbar region: Secondary | ICD-10-CM

## 2023-06-10 DIAGNOSIS — M48061 Spinal stenosis, lumbar region without neurogenic claudication: Secondary | ICD-10-CM | POA: Diagnosis not present

## 2023-06-10 DIAGNOSIS — I152 Hypertension secondary to endocrine disorders: Secondary | ICD-10-CM | POA: Diagnosis not present

## 2023-06-10 LAB — CMP14+EGFR
ALT: 24 IU/L (ref 0–44)
AST: 25 IU/L (ref 0–40)
Albumin: 4.2 g/dL (ref 3.9–4.9)
Alkaline Phosphatase: 53 IU/L (ref 44–121)
BUN/Creatinine Ratio: 13 (ref 10–24)
BUN: 12 mg/dL (ref 8–27)
Bilirubin Total: 1.5 mg/dL — ABNORMAL HIGH (ref 0.0–1.2)
CO2: 23 mmol/L (ref 20–29)
Calcium: 9.1 mg/dL (ref 8.6–10.2)
Chloride: 105 mmol/L (ref 96–106)
Creatinine, Ser: 0.92 mg/dL (ref 0.76–1.27)
Globulin, Total: 2.2 g/dL (ref 1.5–4.5)
Glucose: 97 mg/dL (ref 70–99)
Potassium: 4.4 mmol/L (ref 3.5–5.2)
Sodium: 143 mmol/L (ref 134–144)
Total Protein: 6.4 g/dL (ref 6.0–8.5)
eGFR: 90 mL/min/{1.73_m2} (ref >59)

## 2023-06-10 MED ORDER — AMLODIPINE BESYLATE 5 MG PO TABS: 5.0000 mg | ORAL_TABLET | Freq: Every day | ORAL | 1 refills | Status: DC

## 2023-06-10 NOTE — Progress Notes (Signed)
 Subjective:    Patient ID: Jason Rogers, male    DOB: [birth year], 68 y.o.   MRN: [id number]  Chief Complaint  Patient presents with   Medical Management of Chronic Issues   PT presents to the office today for chronic follow up.  He is followed by Endocrinologists for every 6 months for DM.   He is followed by Cardiologists.   He is followed by GI for RUQ pain. Had a CT abdomen that showed, "1. No acute findings or explanation for the provided symptoms. 2. Hepatic steatosis. 3. Mild distal colonic diverticulosis. Correlate with colonoscopy results. 4.  Aortic Atherosclerosis (ICD10-I70.0)."   He is followed by Ortho and dermatologists as needed.    Hypertension This is a chronic problem. The current episode started more than 1 year ago. The problem has been waxing and waning since onset. The problem is uncontrolled. Associated symptoms include malaise/fatigue. Pertinent negatives include no blurred vision, peripheral edema or shortness of breath. Risk factors for coronary artery disease include dyslipidemia, diabetes mellitus, obesity and sedentary lifestyle. The current treatment provides moderate improvement. There is no history of CVA or heart failure.  Gastroesophageal Reflux He complains of belching and heartburn. This is a chronic problem. The current episode started more than 1 year ago. The problem occurs rarely. The symptoms are aggravated by certain foods. Risk factors include obesity. He has tried a PPI for the symptoms. The treatment provided moderate relief.  Hyperlipidemia This is a chronic problem. The current episode started more than 1 year ago. The problem is controlled. Recent lipid tests were reviewed and are normal. Exacerbating diseases include obesity. Pertinent negatives include no shortness of breath. Current antihyperlipidemic treatment includes statins. The current treatment provides moderate improvement of lipids. Risk factors for coronary artery disease  include dyslipidemia, diabetes mellitus, hypertension, a sedentary lifestyle and post-menopausal.  Diabetes He presents for his follow-up diabetic visit. He has type 2 diabetes mellitus. Pertinent negatives for diabetes include no blurred vision and no foot paresthesias. Symptoms are stable. Pertinent negatives for diabetic complications include no CVA. Risk factors for coronary artery disease include diabetes mellitus, dyslipidemia, hypertension and sedentary lifestyle. He is following a generally healthy diet. His overall blood glucose range is 130-140 mg/dl. Eye exam is current.  Back Pain This is a chronic problem. The current episode started more than 1 year ago. The problem occurs intermittently. The problem has been waxing and waning since onset. The pain is present in the lumbar spine. The quality of the pain is described as aching. The pain is at a severity of 4/10. The pain is moderate. He has tried NSAIDs for the symptoms. The treatment provided moderate relief.  Anemia Presents for follow-up visit. Symptoms include malaise/fatigue. There is no history of heart failure.      Review of Systems  Constitutional:  Positive for malaise/fatigue.  Eyes:  Negative for blurred vision.  Respiratory:  Negative for shortness of breath.   Gastrointestinal:  Positive for heartburn.  Musculoskeletal:  Positive for back pain.  All other systems reviewed and are negative.   Family History  Problem Relation Age of Onset   Diabetes Mother    Heart disease Father    Social History   Socioeconomic History   Marital status: Married    Spouse name: Jason Rogers   Number of children: Not on file   Years of education: Not on file   Highest education level: Not on file  Occupational History    Comment: loads steel 3x  per week  Tobacco Use   Smoking status: Never   Smokeless tobacco: Never  Vaping Use   Vaping status: Never Used  Substance and Sexual Activity   Alcohol use: Yes    Comment: rare    Drug use: No   Sexual activity: Not on file  Other Topics Concern   Not on file  Social History Narrative   Not on file   Social Drivers of Health   Financial Resource Strain: Low Risk  (06/07/2023)   Overall Financial Resource Strain (CARDIA)    Difficulty of Paying Living Expenses: Not hard at all  Food Insecurity: No Food Insecurity (06/07/2023)   Hunger Vital Sign    Worried About Running Out of Food in the Last Year: Never true    Ran Out of Food in the Last Year: Never true  Transportation Needs: No Transportation Needs (06/07/2023)   PRAPARE - Administrator, Civil Service (Medical): No    Lack of Transportation (Non-Medical): No  Physical Activity: Sufficiently Active (06/07/2023)   Exercise Vital Sign    Days of Exercise per Week: 7 days    Minutes of Exercise per Session: 30 min  Stress: No Stress Concern Present (06/07/2023)   [employer] of Occupational Health - Occupational Stress Questionnaire    Feeling of Stress : Not at all  Social Connections: Moderately Integrated (06/07/2023)   Social Connection and Isolation Panel [NHANES]    Frequency of Communication with Friends and Family: More than three times a week    Frequency of Social Gatherings with Friends and Family: More than three times a week    Attends Religious Services: More than 4 times per year    Active Member of [employer] or Organizations: No    Attends Banker Meetings: Never    Marital Status: Married       Objective:   Physical Exam Vitals reviewed.  Constitutional:      General: He is not in acute distress.    Appearance: He is well-developed. He is obese.  HENT:     Head: Normocephalic.     Right Ear: Tympanic membrane normal.     Left Ear: Tympanic membrane normal.  Eyes:     General:        Right eye: No discharge.        Left eye: No discharge.     Pupils: Pupils are equal, round, and reactive to light.  Neck:     Thyroid: No thyromegaly.  Cardiovascular:      Rate and Rhythm: Normal rate and regular rhythm.     Heart sounds: Normal heart sounds. No murmur heard. Pulmonary:     Effort: Pulmonary effort is normal. No respiratory distress.     Breath sounds: Normal breath sounds. No wheezing.  Abdominal:     General: Bowel sounds are normal. There is no distension.     Palpations: Abdomen is soft.     Tenderness: There is no abdominal tenderness.  Musculoskeletal:        General: No tenderness. Normal range of motion.     Cervical back: Normal range of motion and neck supple.  Skin:    General: Skin is warm and dry.     Findings: No erythema or rash.  Neurological:     Mental Status: He is alert and oriented to person, place, and time.     Cranial Nerves: No cranial nerve deficit.     Deep Tendon Reflexes: Reflexes are normal  and symmetric.  Psychiatric:        Behavior: Behavior normal.        Thought Content: Thought content normal.        Judgment: Judgment normal.      BP (!) 149/82   Pulse 69   Temp (!) 97 F (36.1 C) (Temporal)   Ht 6' (1.829 m)   Wt 223 lb 12.8 oz (101.5 kg)   SpO2 95%   BMI 30.35 kg/m      Assessment & Plan:  Jason Rogers comes in today with chief complaint of Medical Management of Chronic Issues   Diagnosis and orders addressed:  1. Type 2 diabetes mellitus with other specified complication, without long-term current use of insulin (HCC) (Primary) - CMP14+EGFR  2. Obesity (BMI 30-39.9) - CMP14+EGFR  3. Hypertension associated with diabetes (HCC) Will start Norvasc 5 mg  -Dash diet information given -Exercise encouraged - Stress Management  -Continue current meds -RTO in 2 weeks  - CMP14+EGFR - amLODipine (NORVASC) 5 MG tablet; Take 1 tablet (5 mg total) by mouth daily.  Dispense: 90 tablet; Refill: 1  4. Hyperlipidemia associated with type 2 diabetes mellitus (HCC) - CMP14+EGFR  5. Gastroesophageal reflux disease, unspecified whether esophagitis present - CMP14+EGFR  6. Lumbar  foraminal stenosis - CMP14+EGFR  7. Other spondylosis with radiculopathy, lumbar region - CMP14+EGFR   Labs pending Start Norvasc 5 mg  Continue current medications  Health Maintenance reviewed Diet and exercise encouraged  Follow up plan: 2-4 weeks for HTN   Tommas Fragmin, FNP

## 2023-06-10 NOTE — Patient Instructions (Signed)
 Hypertension, Adult High blood pressure (hypertension) is when the force of blood pumping through the arteries is too strong. The arteries are the blood vessels that carry blood from the heart throughout the body. Hypertension forces the heart to work harder to pump blood and may cause arteries to become narrow or stiff. Untreated or uncontrolled hypertension can lead to a heart attack, heart failure, a stroke, kidney disease, and other problems. A blood pressure reading consists of a higher number over a lower number. Ideally, your blood pressure should be below 120/80. The first ("top") number is called the systolic pressure. It is a measure of the pressure in your arteries as your heart beats. The second ("bottom") number is called the diastolic pressure. It is a measure of the pressure in your arteries as the heart relaxes. What are the causes? The exact cause of this condition is not known. There are some conditions that result in high blood pressure. What increases the risk? Certain factors may make you more likely to develop high blood pressure. Some of these risk factors are under your control, including: Smoking. Not getting enough exercise or physical activity. Being overweight. Having too much fat, sugar, calories, or salt (sodium) in your diet. Drinking too much alcohol. Other risk factors include: Having a personal history of heart disease, diabetes, high cholesterol, or kidney disease. Stress. Having a family history of high blood pressure and high cholesterol. Having obstructive sleep apnea. Age. The risk increases with age. What are the signs or symptoms? High blood pressure may not cause symptoms. Very high blood pressure (hypertensive crisis) may cause: Headache. Fast or irregular heartbeats (palpitations). Shortness of breath. Nosebleed. Nausea and vomiting. Vision changes. Severe chest pain, dizziness, and seizures. How is this diagnosed? This condition is diagnosed by  measuring your blood pressure while you are seated, with your arm resting on a flat surface, your legs uncrossed, and your feet flat on the floor. The cuff of the blood pressure monitor will be placed directly against the skin of your upper arm at the level of your heart. Blood pressure should be measured at least twice using the same arm. Certain conditions can cause a difference in blood pressure between your right and left arms. If you have a high blood pressure reading during one visit or you have normal blood pressure with other risk factors, you may be asked to: Return on a different day to have your blood pressure checked again. Monitor your blood pressure at home for 1 week or longer. If you are diagnosed with hypertension, you may have other blood or imaging tests to help your health care provider understand your overall risk for other conditions. How is this treated? This condition is treated by making healthy lifestyle changes, such as eating healthy foods, exercising more, and reducing your alcohol intake. You may be referred for counseling on a healthy diet and physical activity. Your health care provider may prescribe medicine if lifestyle changes are not enough to get your blood pressure under control and if: Your systolic blood pressure is above 130. Your diastolic blood pressure is above 80. Your personal target blood pressure may vary depending on your medical conditions, your age, and other factors. Follow these instructions at home: Eating and drinking  Eat a diet that is high in fiber and potassium, and low in sodium, added sugar, and fat. An example of this eating plan is called the DASH diet. DASH stands for Dietary Approaches to Stop Hypertension. To eat this way: Eat  plenty of fresh fruits and vegetables. Try to fill one half of your plate at each meal with fruits and vegetables. Eat whole grains, such as whole-wheat pasta, brown rice, or whole-grain bread. Fill about one  fourth of your plate with whole grains. Eat or drink low-fat dairy products, such as skim milk or low-fat yogurt. Avoid fatty cuts of meat, processed or cured meats, and poultry with skin. Fill about one fourth of your plate with lean proteins, such as fish, chicken without skin, beans, eggs, or tofu. Avoid pre-made and processed foods. These tend to be higher in sodium, added sugar, and fat. Reduce your daily sodium intake. Many people with hypertension should eat less than 1,500 mg of sodium a day. Do not drink alcohol if: Your health care provider tells you not to drink. You are pregnant, may be pregnant, or are planning to become pregnant. If you drink alcohol: Limit how much you have to: 0-1 drink a day for women. 0-2 drinks a day for men. Know how much alcohol is in your drink. In the [place] one drink equals one 12 oz bottle of beer (355 mL), one 5 oz glass of wine (148 mL), or one 1 oz glass of hard liquor (44 mL). Lifestyle  Work with your health care provider to maintain a healthy body weight or to lose weight. Ask what an ideal weight is for you. Get at least 30 minutes of exercise that causes your heart to beat faster (aerobic exercise) most days of the week. Activities may include walking, swimming, or biking. Include exercise to strengthen your muscles (resistance exercise), such as Pilates or lifting weights, as part of your weekly exercise routine. Try to do these types of exercises for 30 minutes at least 3 days a week. Do not use any products that contain nicotine or tobacco. These products include cigarettes, chewing tobacco, and vaping devices, such as e-cigarettes. If you need help quitting, ask your health care provider. Monitor your blood pressure at home as told by your health care provider. Keep all follow-up visits. This is important. Medicines Take over-the-counter and prescription medicines only as told by your health care provider. Follow directions carefully. Blood  pressure medicines must be taken as prescribed. Do not skip doses of blood pressure medicine. Doing this puts you at risk for problems and can make the medicine less effective. Ask your health care provider about side effects or reactions to medicines that you should watch for. Contact a health care provider if you: Think you are having a reaction to a medicine you are taking. Have headaches that keep coming back (recurring). Feel dizzy. Have swelling in your ankles. Have trouble with your vision. Get help right away if you: Develop a severe headache or confusion. Have unusual weakness or numbness. Feel faint. Have severe pain in your chest or abdomen. Vomit repeatedly. Have trouble breathing. These symptoms may be an emergency. Get help right away. Call 911. Do not wait to see if the symptoms will go away. Do not drive yourself to the hospital. Summary Hypertension is when the force of blood pumping through your arteries is too strong. If this condition is not controlled, it may put you at risk for serious complications. Your personal target blood pressure may vary depending on your medical conditions, your age, and other factors. For most people, a normal blood pressure is less than 120/80. Hypertension is treated with lifestyle changes, medicines, or a combination of both. Lifestyle changes include losing weight, eating a healthy,  low-sodium diet, exercising more, and limiting alcohol. This information is not intended to replace advice given to you by your health care provider. Make sure you discuss any questions you have with your health care provider. Document Revised: 12/20/2020 Document Reviewed: 12/20/2020 Elsevier Patient Education  2024 [employer].

## 2023-06-11 ENCOUNTER — Telehealth: Payer: Self-pay

## 2023-06-11 NOTE — Telephone Encounter (Signed)
 Copied from CRM [id number]. Topic: Clinical - Lab/Test Results >> Jun 11, 2023  1:58 PM Carlatta H wrote: Reason for CRM: Advised per chart Kidney and liver function stable//Patient would also know why A1C was not checked

## 2023-06-25 ENCOUNTER — Encounter: Payer: Self-pay | Admitting: Family

## 2023-06-25 ENCOUNTER — Ambulatory Visit (INDEPENDENT_AMBULATORY_CARE_PROVIDER_SITE_OTHER): Admitting: Family

## 2023-06-25 VITALS — BP 122/70 | HR 80 | Temp 97.9°F | Ht 72.0 in | Wt 224.8 lb

## 2023-06-25 DIAGNOSIS — I152 Hypertension secondary to endocrine disorders: Secondary | ICD-10-CM | POA: Diagnosis not present

## 2023-06-25 DIAGNOSIS — E1159 Type 2 diabetes mellitus with other circulatory complications: Secondary | ICD-10-CM

## 2023-06-25 NOTE — Patient Instructions (Signed)
 Hypertension, Adult High blood pressure (hypertension) is when the force of blood pumping through the arteries is too strong. The arteries are the blood vessels that carry blood from the heart throughout the body. Hypertension forces the heart to work harder to pump blood and may cause arteries to become narrow or stiff. Untreated or uncontrolled hypertension can lead to a heart attack, heart failure, a stroke, kidney disease, and other problems. A blood pressure reading consists of a higher number over a lower number. Ideally, your blood pressure should be below 120/80. The first ("top") number is called the systolic pressure. It is a measure of the pressure in your arteries as your heart beats. The second ("bottom") number is called the diastolic pressure. It is a measure of the pressure in your arteries as the heart relaxes. What are the causes? The exact cause of this condition is not known. There are some conditions that result in high blood pressure. What increases the risk? Certain factors may make you more likely to develop high blood pressure. Some of these risk factors are under your control, including: Smoking. Not getting enough exercise or physical activity. Being overweight. Having too much fat, sugar, calories, or salt (sodium) in your diet. Drinking too much alcohol. Other risk factors include: Having a personal history of heart disease, diabetes, high cholesterol, or kidney disease. Stress. Having a family history of high blood pressure and high cholesterol. Having obstructive sleep apnea. Age. The risk increases with age. What are the signs or symptoms? High blood pressure may not cause symptoms. Very high blood pressure (hypertensive crisis) may cause: Headache. Fast or irregular heartbeats (palpitations). Shortness of breath. Nosebleed. Nausea and vomiting. Vision changes. Severe chest pain, dizziness, and seizures. How is this diagnosed? This condition is diagnosed by  measuring your blood pressure while you are seated, with your arm resting on a flat surface, your legs uncrossed, and your feet flat on the floor. The cuff of the blood pressure monitor will be placed directly against the skin of your upper arm at the level of your heart. Blood pressure should be measured at least twice using the same arm. Certain conditions can cause a difference in blood pressure between your right and left arms. If you have a high blood pressure reading during one visit or you have normal blood pressure with other risk factors, you may be asked to: Return on a different day to have your blood pressure checked again. Monitor your blood pressure at home for 1 week or longer. If you are diagnosed with hypertension, you may have other blood or imaging tests to help your health care provider understand your overall risk for other conditions. How is this treated? This condition is treated by making healthy lifestyle changes, such as eating healthy foods, exercising more, and reducing your alcohol intake. You may be referred for counseling on a healthy diet and physical activity. Your health care provider may prescribe medicine if lifestyle changes are not enough to get your blood pressure under control and if: Your systolic blood pressure is above 130. Your diastolic blood pressure is above 80. Your personal target blood pressure may vary depending on your medical conditions, your age, and other factors. Follow these instructions at home: Eating and drinking  Eat a diet that is high in fiber and potassium, and low in sodium, added sugar, and fat. An example of this eating plan is called the DASH diet. DASH stands for Dietary Approaches to Stop Hypertension. To eat this way: Eat  plenty of fresh fruits and vegetables. Try to fill one half of your plate at each meal with fruits and vegetables. Eat whole grains, such as whole-wheat pasta, brown rice, or whole-grain bread. Fill about one  fourth of your plate with whole grains. Eat or drink low-fat dairy products, such as skim milk or low-fat yogurt. Avoid fatty cuts of meat, processed or cured meats, and poultry with skin. Fill about one fourth of your plate with lean proteins, such as fish, chicken without skin, beans, eggs, or tofu. Avoid pre-made and processed foods. These tend to be higher in sodium, added sugar, and fat. Reduce your daily sodium intake. Many people with hypertension should eat less than 1,500 mg of sodium a day. Do not drink alcohol if: Your health care provider tells you not to drink. You are pregnant, may be pregnant, or are planning to become pregnant. If you drink alcohol: Limit how much you have to: 0-1 drink a day for women. 0-2 drinks a day for men. Know how much alcohol is in your drink. In the [place] one drink equals one 12 oz bottle of beer (355 mL), one 5 oz glass of wine (148 mL), or one 1 oz glass of hard liquor (44 mL). Lifestyle  Work with your health care provider to maintain a healthy body weight or to lose weight. Ask what an ideal weight is for you. Get at least 30 minutes of exercise that causes your heart to beat faster (aerobic exercise) most days of the week. Activities may include walking, swimming, or biking. Include exercise to strengthen your muscles (resistance exercise), such as Pilates or lifting weights, as part of your weekly exercise routine. Try to do these types of exercises for 30 minutes at least 3 days a week. Do not use any products that contain nicotine or tobacco. These products include cigarettes, chewing tobacco, and vaping devices, such as e-cigarettes. If you need help quitting, ask your health care provider. Monitor your blood pressure at home as told by your health care provider. Keep all follow-up visits. This is important. Medicines Take over-the-counter and prescription medicines only as told by your health care provider. Follow directions carefully. Blood  pressure medicines must be taken as prescribed. Do not skip doses of blood pressure medicine. Doing this puts you at risk for problems and can make the medicine less effective. Ask your health care provider about side effects or reactions to medicines that you should watch for. Contact a health care provider if you: Think you are having a reaction to a medicine you are taking. Have headaches that keep coming back (recurring). Feel dizzy. Have swelling in your ankles. Have trouble with your vision. Get help right away if you: Develop a severe headache or confusion. Have unusual weakness or numbness. Feel faint. Have severe pain in your chest or abdomen. Vomit repeatedly. Have trouble breathing. These symptoms may be an emergency. Get help right away. Call 911. Do not wait to see if the symptoms will go away. Do not drive yourself to the hospital. Summary Hypertension is when the force of blood pumping through your arteries is too strong. If this condition is not controlled, it may put you at risk for serious complications. Your personal target blood pressure may vary depending on your medical conditions, your age, and other factors. For most people, a normal blood pressure is less than 120/80. Hypertension is treated with lifestyle changes, medicines, or a combination of both. Lifestyle changes include losing weight, eating a healthy,  low-sodium diet, exercising more, and limiting alcohol. This information is not intended to replace advice given to you by your health care provider. Make sure you discuss any questions you have with your health care provider. Document Revised: 12/20/2020 Document Reviewed: 12/20/2020 Elsevier Patient Education  2024 [employer].

## 2023-06-25 NOTE — Progress Notes (Signed)
 Subjective:    Patient ID: Jason Rogers, male    DOB: 1955/06/08, 68 y.o.   MRN: [id number]  Chief Complaint  Patient presents with   Hypertension   Pt presents to the office today to recheck HTN. Pt's BP is at goal.  Hypertension This is a chronic problem. The current episode started more than 1 year ago. The problem has been resolved since onset. The problem is controlled. Associated symptoms include malaise/fatigue. Pertinent negatives include no peripheral edema or shortness of breath. Risk factors for coronary artery disease include dyslipidemia, obesity and male gender. The current treatment provides moderate improvement.      Review of Systems  Constitutional:  Positive for malaise/fatigue.  Respiratory:  Negative for shortness of breath.   All other systems reviewed and are negative.   Social History   Socioeconomic History   Marital status: Married    Spouse name: Jason Rogers   Number of children: Not on file   Years of education: Not on file   Highest education level: Not on file  Occupational History    Comment: loads steel 3x per week  Tobacco Use   Smoking status: Never   Smokeless tobacco: Never  Vaping Use   Vaping status: Never Used  Substance and Sexual Activity   Alcohol use: Yes    Comment: rare   Drug use: No   Sexual activity: Not on file  Other Topics Concern   Not on file  Social History Narrative   Not on file   Social Drivers of Health   Financial Resource Strain: Low Risk  (06/07/2023)   Overall Financial Resource Strain (CARDIA)    Difficulty of Paying Living Expenses: Not hard at all  Food Insecurity: No Food Insecurity (06/07/2023)   Hunger Vital Sign    Worried About Running Out of Food in the Last Year: Never true    Ran Out of Food in the Last Year: Never true  Transportation Needs: No Transportation Needs (06/07/2023)   PRAPARE - Administrator, Civil Service (Medical): No    Lack of Transportation (Non-Medical): No   Physical Activity: Sufficiently Active (06/07/2023)   Exercise Vital Sign    Days of Exercise per Week: 7 days    Minutes of Exercise per Session: 30 min  Stress: No Stress Concern Present (06/07/2023)   [employer] of Occupational Health - Occupational Stress Questionnaire    Feeling of Stress : Not at all  Social Connections: Moderately Integrated (06/07/2023)   Social Connection and Isolation Panel [NHANES]    Frequency of Communication with Friends and Family: More than three times a week    Frequency of Social Gatherings with Friends and Family: More than three times a week    Attends Religious Services: More than 4 times per year    Active Member of [employer] or Organizations: No    Attends Banker Meetings: Never    Marital Status: Married   Family History  Problem Relation Age of Onset   Diabetes Mother    Heart disease Father         Objective:   Physical Exam Vitals reviewed.  Constitutional:      General: He is not in acute distress.    Appearance: He is well-developed.  HENT:     Head: Normocephalic.     Right Ear: Tympanic membrane normal.     Left Ear: Tympanic membrane normal.  Eyes:     General:  Right eye: No discharge.        Left eye: No discharge.     Pupils: Pupils are equal, round, and reactive to light.  Neck:     Thyroid : No thyromegaly.  Cardiovascular:     Rate and Rhythm: Normal rate and regular rhythm.     Heart sounds: Normal heart sounds. No murmur heard. Pulmonary:     Effort: Pulmonary effort is normal. No respiratory distress.     Breath sounds: Normal breath sounds. No wheezing.  Abdominal:     General: Bowel sounds are normal. There is no distension.     Palpations: Abdomen is soft.     Tenderness: There is no abdominal tenderness.  Musculoskeletal:        General: No tenderness. Normal range of motion.     Cervical back: Normal range of motion and neck supple.  Skin:    General: Skin is warm and dry.      Findings: No erythema or rash.  Neurological:     Mental Status: He is alert and oriented to person, place, and time.     Cranial Nerves: No cranial nerve deficit.     Deep Tendon Reflexes: Reflexes are normal and symmetric.  Psychiatric:        Behavior: Behavior normal.        Thought Content: Thought content normal.        Judgment: Judgment normal.       BP 122/70   Pulse 80   Temp 97.9 F (36.6 C) (Temporal)   Ht 6' (1.829 m)   Wt 224 lb 12.8 oz (102 kg)   SpO2 97%   BMI 30.49 kg/m      Assessment & Plan:  Jason Rogers comes in today with chief complaint of Hypertension   Diagnosis and orders addressed:  1. Hypertension associated with diabetes (HCC) (Primary) At goal today Continue current medications   Keep follow up with specialists  Health Maintenance reviewed Diet and exercise encouraged  No follow-ups on file.    Tommas Fragmin, FNP

## 2023-07-02 ENCOUNTER — Other Ambulatory Visit: Payer: Self-pay | Admitting: Family

## 2023-07-21 ENCOUNTER — Other Ambulatory Visit: Payer: Self-pay | Admitting: Family

## 2023-07-21 DIAGNOSIS — E1169 Type 2 diabetes mellitus with other specified complication: Secondary | ICD-10-CM

## 2023-08-02 ENCOUNTER — Other Ambulatory Visit: Payer: Self-pay | Admitting: Family

## 2023-08-02 DIAGNOSIS — K219 Gastro-esophageal reflux disease without esophagitis: Secondary | ICD-10-CM

## 2023-08-08 ENCOUNTER — Other Ambulatory Visit: Payer: Self-pay | Admitting: Family

## 2023-08-08 DIAGNOSIS — M4726 Other spondylosis with radiculopathy, lumbar region: Secondary | ICD-10-CM

## 2023-09-04 DIAGNOSIS — K760 Fatty (change of) liver, not elsewhere classified: Secondary | ICD-10-CM | POA: Diagnosis not present

## 2023-09-04 DIAGNOSIS — E785 Hyperlipidemia, unspecified: Secondary | ICD-10-CM | POA: Diagnosis not present

## 2023-09-04 DIAGNOSIS — R1011 Right upper quadrant pain: Secondary | ICD-10-CM | POA: Diagnosis not present

## 2023-09-04 DIAGNOSIS — E1169 Type 2 diabetes mellitus with other specified complication: Secondary | ICD-10-CM | POA: Diagnosis not present

## 2023-09-04 DIAGNOSIS — Z1159 Encounter for screening for other viral diseases: Secondary | ICD-10-CM | POA: Diagnosis not present

## 2023-09-04 DIAGNOSIS — R799 Abnormal finding of blood chemistry, unspecified: Secondary | ICD-10-CM | POA: Diagnosis not present

## 2023-09-04 DIAGNOSIS — R768 Other specified abnormal immunological findings in serum: Secondary | ICD-10-CM | POA: Diagnosis not present

## 2023-09-09 ENCOUNTER — Other Ambulatory Visit: Payer: Self-pay | Admitting: Family

## 2023-09-09 DIAGNOSIS — E611 Iron deficiency: Secondary | ICD-10-CM

## 2023-09-11 DIAGNOSIS — E1169 Type 2 diabetes mellitus with other specified complication: Secondary | ICD-10-CM | POA: Diagnosis not present

## 2023-09-11 DIAGNOSIS — K760 Fatty (change of) liver, not elsewhere classified: Secondary | ICD-10-CM | POA: Diagnosis not present

## 2023-09-11 DIAGNOSIS — E1159 Type 2 diabetes mellitus with other circulatory complications: Secondary | ICD-10-CM | POA: Diagnosis not present

## 2023-09-11 DIAGNOSIS — I10 Essential (primary) hypertension: Secondary | ICD-10-CM | POA: Diagnosis not present

## 2023-09-11 DIAGNOSIS — E785 Hyperlipidemia, unspecified: Secondary | ICD-10-CM | POA: Diagnosis not present

## 2023-10-08 DIAGNOSIS — K760 Fatty (change of) liver, not elsewhere classified: Secondary | ICD-10-CM | POA: Diagnosis not present

## 2023-10-15 ENCOUNTER — Other Ambulatory Visit: Payer: Self-pay | Admitting: Family

## 2023-10-15 DIAGNOSIS — E1169 Type 2 diabetes mellitus with other specified complication: Secondary | ICD-10-CM

## 2023-11-12 ENCOUNTER — Other Ambulatory Visit: Payer: Self-pay | Admitting: Family

## 2023-11-12 DIAGNOSIS — E1159 Type 2 diabetes mellitus with other circulatory complications: Secondary | ICD-10-CM

## 2023-11-20 ENCOUNTER — Other Ambulatory Visit: Payer: Self-pay | Admitting: Family

## 2023-11-20 DIAGNOSIS — I152 Hypertension secondary to endocrine disorders: Secondary | ICD-10-CM

## 2023-12-10 ENCOUNTER — Encounter: Payer: Self-pay | Admitting: Family

## 2023-12-10 ENCOUNTER — Ambulatory Visit: Admitting: Family

## 2023-12-10 VITALS — BP 136/74 | HR 61 | Temp 97.6°F | Ht 72.0 in | Wt 221.8 lb

## 2023-12-10 DIAGNOSIS — M48061 Spinal stenosis, lumbar region without neurogenic claudication: Secondary | ICD-10-CM | POA: Diagnosis not present

## 2023-12-10 DIAGNOSIS — E1159 Type 2 diabetes mellitus with other circulatory complications: Secondary | ICD-10-CM

## 2023-12-10 DIAGNOSIS — K219 Gastro-esophageal reflux disease without esophagitis: Secondary | ICD-10-CM | POA: Diagnosis not present

## 2023-12-10 DIAGNOSIS — E785 Hyperlipidemia, unspecified: Secondary | ICD-10-CM | POA: Diagnosis not present

## 2023-12-10 DIAGNOSIS — Z0001 Encounter for general adult medical examination with abnormal findings: Secondary | ICD-10-CM | POA: Diagnosis not present

## 2023-12-10 DIAGNOSIS — I152 Hypertension secondary to endocrine disorders: Secondary | ICD-10-CM

## 2023-12-10 DIAGNOSIS — E1169 Type 2 diabetes mellitus with other specified complication: Secondary | ICD-10-CM

## 2023-12-10 DIAGNOSIS — E611 Iron deficiency: Secondary | ICD-10-CM

## 2023-12-10 DIAGNOSIS — E669 Obesity, unspecified: Secondary | ICD-10-CM | POA: Diagnosis not present

## 2023-12-10 DIAGNOSIS — Z0000 Encounter for general adult medical examination without abnormal findings: Secondary | ICD-10-CM

## 2023-12-10 DIAGNOSIS — Z125 Encounter for screening for malignant neoplasm of prostate: Secondary | ICD-10-CM | POA: Diagnosis not present

## 2023-12-10 LAB — BAYER DCA HB A1C WAIVED: HB A1C (BAYER DCA - WAIVED): 6.7 % — ABNORMAL HIGH (ref 4.8–5.6)

## 2023-12-10 MED ORDER — HYDROCHLOROTHIAZIDE 25 MG PO TABS: 25.0000 mg | ORAL_TABLET | Freq: Every day | ORAL | 3 refills | Status: AC

## 2023-12-10 NOTE — Addendum Note (Signed)
 Addended by: LAVELL LYE A on: 12/10/2023 09:52 AM   Modules accepted: Level of Service

## 2023-12-10 NOTE — Patient Instructions (Signed)
 Hypertension, Adult High blood pressure (hypertension) is when the force of blood pumping through the arteries is too strong. The arteries are the blood vessels that carry blood from the heart throughout the body. Hypertension forces the heart to work harder to pump blood and may cause arteries to become narrow or stiff. Untreated or uncontrolled hypertension can lead to a heart attack, heart failure, a stroke, kidney disease, and other problems. A blood pressure reading consists of a higher number over a lower number. Ideally, your blood pressure should be below 120/80. The first ("top") number is called the systolic pressure. It is a measure of the pressure in your arteries as your heart beats. The second ("bottom") number is called the diastolic pressure. It is a measure of the pressure in your arteries as the heart relaxes. What are the causes? The exact cause of this condition is not known. There are some conditions that result in high blood pressure. What increases the risk? Certain factors may make you more likely to develop high blood pressure. Some of these risk factors are under your control, including: Smoking. Not getting enough exercise or physical activity. Being overweight. Having too much fat, sugar, calories, or salt (sodium) in your diet. Drinking too much alcohol. Other risk factors include: Having a personal history of heart disease, diabetes, high cholesterol, or kidney disease. Stress. Having a family history of high blood pressure and high cholesterol. Having obstructive sleep apnea. Age. The risk increases with age. What are the signs or symptoms? High blood pressure may not cause symptoms. Very high blood pressure (hypertensive crisis) may cause: Headache. Fast or irregular heartbeats (palpitations). Shortness of breath. Nosebleed. Nausea and vomiting. Vision changes. Severe chest pain, dizziness, and seizures. How is this diagnosed? This condition is diagnosed by  measuring your blood pressure while you are seated, with your arm resting on a flat surface, your legs uncrossed, and your feet flat on the floor. The cuff of the blood pressure monitor will be placed directly against the skin of your upper arm at the level of your heart. Blood pressure should be measured at least twice using the same arm. Certain conditions can cause a difference in blood pressure between your right and left arms. If you have a high blood pressure reading during one visit or you have normal blood pressure with other risk factors, you may be asked to: Return on a different day to have your blood pressure checked again. Monitor your blood pressure at home for 1 week or longer. If you are diagnosed with hypertension, you may have other blood or imaging tests to help your health care provider understand your overall risk for other conditions. How is this treated? This condition is treated by making healthy lifestyle changes, such as eating healthy foods, exercising more, and reducing your alcohol intake. You may be referred for counseling on a healthy diet and physical activity. Your health care provider may prescribe medicine if lifestyle changes are not enough to get your blood pressure under control and if: Your systolic blood pressure is above 130. Your diastolic blood pressure is above 80. Your personal target blood pressure may vary depending on your medical conditions, your age, and other factors. Follow these instructions at home: Eating and drinking  Eat a diet that is high in fiber and potassium, and low in sodium, added sugar, and fat. An example of this eating plan is called the DASH diet. DASH stands for Dietary Approaches to Stop Hypertension. To eat this way: Eat  plenty of fresh fruits and vegetables. Try to fill one half of your plate at each meal with fruits and vegetables. Eat whole grains, such as whole-wheat pasta, brown rice, or whole-grain bread. Fill about one  fourth of your plate with whole grains. Eat or drink low-fat dairy products, such as skim milk or low-fat yogurt. Avoid fatty cuts of meat, processed or cured meats, and poultry with skin. Fill about one fourth of your plate with lean proteins, such as fish, chicken without skin, beans, eggs, or tofu. Avoid pre-made and processed foods. These tend to be higher in sodium, added sugar, and fat. Reduce your daily sodium intake. Many people with hypertension should eat less than 1,500 mg of sodium a day. Do not drink alcohol if: Your health care provider tells you not to drink. You are pregnant, may be pregnant, or are planning to become pregnant. If you drink alcohol: Limit how much you have to: 0-1 drink a day for women. 0-2 drinks a day for men. Know how much alcohol is in your drink. In the [place] one drink equals one 12 oz bottle of beer (355 mL), one 5 oz glass of wine (148 mL), or one 1 oz glass of hard liquor (44 mL). Lifestyle  Work with your health care provider to maintain a healthy body weight or to lose weight. Ask what an ideal weight is for you. Get at least 30 minutes of exercise that causes your heart to beat faster (aerobic exercise) most days of the week. Activities may include walking, swimming, or biking. Include exercise to strengthen your muscles (resistance exercise), such as Pilates or lifting weights, as part of your weekly exercise routine. Try to do these types of exercises for 30 minutes at least 3 days a week. Do not use any products that contain nicotine or tobacco. These products include cigarettes, chewing tobacco, and vaping devices, such as e-cigarettes. If you need help quitting, ask your health care provider. Monitor your blood pressure at home as told by your health care provider. Keep all follow-up visits. This is important. Medicines Take over-the-counter and prescription medicines only as told by your health care provider. Follow directions carefully. Blood  pressure medicines must be taken as prescribed. Do not skip doses of blood pressure medicine. Doing this puts you at risk for problems and can make the medicine less effective. Ask your health care provider about side effects or reactions to medicines that you should watch for. Contact a health care provider if you: Think you are having a reaction to a medicine you are taking. Have headaches that keep coming back (recurring). Feel dizzy. Have swelling in your ankles. Have trouble with your vision. Get help right away if you: Develop a severe headache or confusion. Have unusual weakness or numbness. Feel faint. Have severe pain in your chest or abdomen. Vomit repeatedly. Have trouble breathing. These symptoms may be an emergency. Get help right away. Call 911. Do not wait to see if the symptoms will go away. Do not drive yourself to the hospital. Summary Hypertension is when the force of blood pumping through your arteries is too strong. If this condition is not controlled, it may put you at risk for serious complications. Your personal target blood pressure may vary depending on your medical conditions, your age, and other factors. For most people, a normal blood pressure is less than 120/80. Hypertension is treated with lifestyle changes, medicines, or a combination of both. Lifestyle changes include losing weight, eating a healthy,  low-sodium diet, exercising more, and limiting alcohol. This information is not intended to replace advice given to you by your health care provider. Make sure you discuss any questions you have with your health care provider. Document Revised: 12/20/2020 Document Reviewed: 12/20/2020 Elsevier Patient Education  2024 [employer].

## 2023-12-10 NOTE — Progress Notes (Signed)
 Subjective:    Patient ID: Jason Rogers, male    DOB: 09-05-1955, 68 y.o.   MRN: [id number]  Chief Complaint  Patient presents with   Medical Management of Chronic Issues   PT presents to the office today for CPE.  He is followed by Endocrinologists for every 6 months for DM.   He is followed by Cardiologists.   He is followed by GI for RUQ pain. Had a CT abdomen that showed, 1. No acute findings or explanation for the provided symptoms. 2. Hepatic steatosis. 3. Mild distal colonic diverticulosis. Correlate with colonoscopy results. 4.  Aortic Atherosclerosis (ICD10-I70.0).   He is followed by Ortho and dermatologists as needed.     Complaining that since starting Amlodipine  5 mg he has been experiencing ED. Wants to change this medication.   Hypertension This is a chronic problem. The current episode started more than 1 year ago. The problem has been resolved since onset. The problem is controlled. Associated symptoms include malaise/fatigue. Pertinent negatives include no blurred vision, peripheral edema or shortness of breath. Risk factors for coronary artery disease include dyslipidemia, diabetes mellitus, obesity, sedentary lifestyle and male gender. The current treatment provides moderate improvement. There is no history of CVA or heart failure.  Gastroesophageal Reflux He complains of belching and heartburn. This is a chronic problem. The current episode started more than 1 year ago. The problem occurs rarely. The symptoms are aggravated by certain foods. Risk factors include obesity. He has tried a PPI for the symptoms. The treatment provided moderate relief.  Hyperlipidemia This is a chronic problem. The current episode started more than 1 year ago. The problem is controlled. Recent lipid tests were reviewed and are normal. Exacerbating diseases include obesity. Pertinent negatives include no shortness of breath. Current antihyperlipidemic treatment includes statins. The  current treatment provides moderate improvement of lipids. Risk factors for coronary artery disease include dyslipidemia, diabetes mellitus, hypertension, a sedentary lifestyle, post-menopausal, male sex and obesity.  Diabetes He presents for his follow-up diabetic visit. He has type 2 diabetes mellitus. Pertinent negatives for diabetes include no blurred vision and no foot paresthesias. Symptoms are stable. Pertinent negatives for diabetic complications include no CVA. Risk factors for coronary artery disease include diabetes mellitus, dyslipidemia, hypertension, sedentary lifestyle, obesity and male sex. He is following a generally healthy diet. His overall blood glucose range is 110-130 mg/dl. Eye exam is current.  Back Pain This is a chronic problem. The current episode started more than 1 year ago. The problem occurs intermittently. The problem has been waxing and waning since onset. The pain is present in the lumbar spine. The quality of the pain is described as aching. The pain is at a severity of 2/10. The pain is mild. He has tried NSAIDs for the symptoms. The treatment provided moderate relief.  Anemia Presents for follow-up visit. Symptoms include malaise/fatigue. There is no history of heart failure.      Review of Systems  Constitutional:  Positive for malaise/fatigue.  Eyes:  Negative for blurred vision.  Respiratory:  Negative for shortness of breath.   Gastrointestinal:  Positive for heartburn.  Musculoskeletal:  Positive for back pain.  All other systems reviewed and are negative.   Family History  Problem Relation Age of Onset   Diabetes Mother    Heart disease Father    Social History   Socioeconomic History   Marital status: Married    Spouse name: Geofm   Number of children: Not on file  Years of education: Not on file   Highest education level: Not on file  Occupational History    Comment: loads steel 3x per week  Tobacco Use   Smoking status: Never    Smokeless tobacco: Never  Vaping Use   Vaping status: Never Used  Substance and Sexual Activity   Alcohol use: Yes    Comment: rare   Drug use: No   Sexual activity: Not on file  Other Topics Concern   Not on file  Social History Narrative   Not on file   Social Drivers of Health   Financial Resource Strain: Low Risk  (06/07/2023)   Overall Financial Resource Strain (CARDIA)    Difficulty of Paying Living Expenses: Not hard at all  Food Insecurity: Low Risk  (09/04/2023)   Received from [facility]   Hunger Vital Sign    Within the past 12 months, you worried that your food would run out before you got money to buy more: Never true    Within the past 12 months, the food you bought just didn't last and you didn't have money to get more. : Never true  Transportation Needs: No Transportation Needs (09/04/2023)   Received from [facility]    In the past 12 months, has lack of reliable transportation kept you from medical appointments, meetings, work or from getting things needed for daily living? : No  Physical Activity: Sufficiently Active (06/07/2023)   Exercise Vital Sign    Days of Exercise per Week: 7 days    Minutes of Exercise per Session: 30 min  Stress: No Stress Concern Present (06/07/2023)   [employer] of Occupational Health - Occupational Stress Questionnaire    Feeling of Stress : Not at all  Social Connections: Moderately Integrated (06/07/2023)   Social Connection and Isolation Panel    Frequency of Communication with Friends and Family: More than three times a week    Frequency of Social Gatherings with Friends and Family: More than three times a week    Attends Religious Services: More than 4 times per year    Active Member of [employer] or Organizations: No    Attends Banker Meetings: Never    Marital Status: Married       Objective:   Physical Exam Vitals reviewed.  Constitutional:      General: He is not in acute  distress.    Appearance: He is well-developed. He is obese.  HENT:     Head: Normocephalic.     Right Ear: Tympanic membrane normal.     Left Ear: Tympanic membrane normal.  Eyes:     General:        Right eye: No discharge.        Left eye: No discharge.     Pupils: Pupils are equal, round, and reactive to light.  Neck:     Thyroid : No thyromegaly.  Cardiovascular:     Rate and Rhythm: Normal rate and regular rhythm.     Heart sounds: Normal heart sounds. No murmur heard. Pulmonary:     Effort: Pulmonary effort is normal. No respiratory distress.     Breath sounds: Normal breath sounds. No wheezing.  Abdominal:     General: Bowel sounds are normal. There is no distension.     Palpations: Abdomen is soft.     Tenderness: There is no abdominal tenderness.  Musculoskeletal:        General: No tenderness. Normal range of motion.  Cervical back: Normal range of motion and neck supple.  Skin:    General: Skin is warm and dry.     Findings: No erythema or rash.  Neurological:     Mental Status: He is alert and oriented to person, place, and time.     Cranial Nerves: No cranial nerve deficit.     Deep Tendon Reflexes: Reflexes are normal and symmetric.  Psychiatric:        Behavior: Behavior normal.        Thought Content: Thought content normal.        Judgment: Judgment normal.     Diabetic Foot Exam - Simple   Simple Foot Form Diabetic Foot exam was performed with the following findings: Yes 12/10/2023  8:43 AM  Visual Inspection No deformities, no ulcerations, no other skin breakdown bilaterally: Yes Sensation Testing Intact to touch and monofilament testing bilaterally: Yes Pulse Check Posterior Tibialis and Dorsalis pulse intact bilaterally: Yes Comments     BP 136/74   Pulse 61   Temp 97.6 F (36.4 C) (Temporal)   Ht 6' (1.829 m)   Wt 221 lb 12.8 oz (100.6 kg)   SpO2 97%   BMI 30.08 kg/m      Assessment & Plan:  Jason Rogers comes in today with  chief complaint of Medical Management of Chronic Issues   Diagnosis and orders addressed:  1. Annual physical exam (Primary) - CBC with Differential/Platelet - CMP14+EGFR - Lipid panel - TSH - PSA, total and free - Vitamin B12 - Microalbumin / creatinine urine ratio - Bayer DCA Hb A1c Waived  2. Type 2 diabetes mellitus with other specified complication, without long-term current use of insulin  (HCC) - CBC with Differential/Platelet - CMP14+EGFR - Vitamin B12 - Microalbumin / creatinine urine ratio - Bayer DCA Hb A1c Waived  3. Gastroesophageal reflux disease, unspecified whether esophagitis present - CBC with Differential/Platelet - CMP14+EGFR  4. Hyperlipidemia associated with type 2 diabetes mellitus (HCC) - CBC with Differential/Platelet - CMP14+EGFR - Lipid panel  5. Hypertension associated with diabetes (HCC) - CBC with Differential/Platelet - CMP14+EGFR - hydrochlorothiazide  (HYDRODIURIL ) 25 MG tablet; Take 1 tablet (25 mg total) by mouth daily.  Dispense: 90 tablet; Refill: 3  6. Iron deficiency  - CBC with Differential/Platelet - CMP14+EGFR  7. Obesity (BMI 30-39.9 - CBC with Differential/Platelet - CMP14+EGFR  8. Lumbar foraminal stenosis  - CBC with Differential/Platelet - CMP14+EGFR    Labs pending Stop Norvasc  5 mg and start hydrochlorothiazide  25 mg -Daily blood pressure log given with instructions on how to fill out and told to bring to next visit -Dash diet information given Continue current medications  Health Maintenance reviewed Diet and exercise encouraged  Follow up plan: 1 month for HTN recheck    Bari Learn, FNP

## 2023-12-11 LAB — CMP14+EGFR
ALT: 20 IU/L (ref 0–44)
AST: 17 IU/L (ref 0–40)
Albumin: 4.4 g/dL (ref 3.9–4.9)
Alkaline Phosphatase: 51 IU/L (ref 47–123)
BUN/Creatinine Ratio: 17 (ref 10–24)
BUN: 16 mg/dL (ref 8–27)
Bilirubin Total: 1.6 mg/dL — ABNORMAL HIGH (ref 0.0–1.2)
CO2: 24 mmol/L (ref 20–29)
Calcium: 9.2 mg/dL (ref 8.6–10.2)
Chloride: 104 mmol/L (ref 96–106)
Creatinine, Ser: 0.95 mg/dL (ref 0.76–1.27)
Globulin, Total: 2 g/dL (ref 1.5–4.5)
Glucose: 109 mg/dL — ABNORMAL HIGH (ref 70–99)
Potassium: 4.1 mmol/L (ref 3.5–5.2)
Sodium: 142 mmol/L (ref 134–144)
Total Protein: 6.4 g/dL (ref 6.0–8.5)
eGFR: 87 mL/min/1.73 (ref >59)

## 2023-12-11 LAB — CBC WITH DIFFERENTIAL/PLATELET
Basophils Absolute: 0 x10E3/uL (ref 0.0–0.2)
Basos: 1 %
EOS (ABSOLUTE): 0.2 x10E3/uL (ref 0.0–0.4)
Eos: 2 %
Hematocrit: 46.9 % (ref 37.5–51.0)
Hemoglobin: 15.2 g/dL (ref 13.0–17.7)
Immature Grans (Abs): 0 x10E3/uL (ref 0.0–0.1)
Immature Granulocytes: 0 %
Lymphocytes Absolute: 1.7 x10E3/uL (ref 0.7–3.1)
Lymphs: 24 %
MCH: 29.3 pg (ref 26.6–33.0)
MCHC: 32.4 g/dL (ref 31.5–35.7)
MCV: 91 fL (ref 79–97)
Monocytes Absolute: 0.6 x10E3/uL (ref 0.1–0.9)
Monocytes: 9 %
Neutrophils Absolute: 4.5 x10E3/uL (ref 1.4–7.0)
Neutrophils: 64 %
Platelets: 184 x10E3/uL (ref 150–450)
RBC: 5.18 x10E6/uL (ref 4.14–5.80)
RDW: 13 % (ref 11.6–15.4)
WBC: 7 x10E3/uL (ref 3.4–10.8)

## 2023-12-11 LAB — VITAMIN B12: Vitamin B-12: 516 pg/mL (ref 232–1245)

## 2023-12-11 LAB — PSA, TOTAL AND FREE
PSA, Free Pct: 28.3 %
PSA, Free: 0.82 ng/mL
Prostate Specific Ag, Serum: 2.9 ng/mL (ref 0.0–4.0)

## 2023-12-11 LAB — MICROALBUMIN / CREATININE URINE RATIO
Creatinine, Urine: 138.3 mg/dL
Microalb/Creat Ratio: 5 mg/g{creat} (ref 0–29)
Microalbumin, Urine: 6.6 ug/mL

## 2023-12-11 LAB — TSH: TSH: 5.2 u[IU]/mL — ABNORMAL HIGH (ref 0.450–4.500)

## 2023-12-11 LAB — LIPID PANEL
Chol/HDL Ratio: 2.3 ratio (ref 0.0–5.0)
Cholesterol, Total: 98 mg/dL — ABNORMAL LOW (ref 100–199)
HDL: 43 mg/dL (ref >39)
LDL Chol Calc (NIH): 36 mg/dL (ref 0–99)
Triglycerides: 101 mg/dL (ref 0–149)
VLDL Cholesterol Cal: 19 mg/dL (ref 5–40)

## 2023-12-12 ENCOUNTER — Ambulatory Visit: Payer: Self-pay | Admitting: Family

## 2023-12-16 ENCOUNTER — Telehealth: Payer: Self-pay

## 2023-12-16 NOTE — Telephone Encounter (Signed)
 Jason Rogers

## 2023-12-17 ENCOUNTER — Encounter: Payer: Self-pay | Admitting: *Deleted

## 2023-12-22 ENCOUNTER — Other Ambulatory Visit: Payer: Self-pay | Admitting: Family

## 2023-12-30 ENCOUNTER — Encounter: Payer: Self-pay | Admitting: Radiology

## 2024-01-16 ENCOUNTER — Ambulatory Visit: Admitting: Family

## 2024-01-21 ENCOUNTER — Encounter: Payer: Self-pay | Admitting: Family

## 2024-01-21 ENCOUNTER — Ambulatory Visit: Payer: Self-pay | Admitting: Family

## 2024-01-21 VITALS — BP 145/83 | HR 71 | Temp 97.3°F | Ht 72.0 in | Wt 224.0 lb

## 2024-01-21 DIAGNOSIS — H9191 Unspecified hearing loss, right ear: Secondary | ICD-10-CM

## 2024-01-21 DIAGNOSIS — Z23 Encounter for immunization: Secondary | ICD-10-CM | POA: Diagnosis not present

## 2024-01-21 DIAGNOSIS — I152 Hypertension secondary to endocrine disorders: Secondary | ICD-10-CM | POA: Diagnosis not present

## 2024-01-21 DIAGNOSIS — E1159 Type 2 diabetes mellitus with other circulatory complications: Secondary | ICD-10-CM | POA: Diagnosis not present

## 2024-01-21 LAB — CMP14+EGFR
ALT: 20 IU/L (ref 0–44)
AST: 21 IU/L (ref 0–40)
Albumin: 4.4 g/dL (ref 3.9–4.9)
Alkaline Phosphatase: 51 IU/L (ref 47–123)
BUN/Creatinine Ratio: 14 (ref 10–24)
BUN: 15 mg/dL (ref 8–27)
Bilirubin Total: 2.1 mg/dL — ABNORMAL HIGH (ref 0.0–1.2)
CO2: 26 mmol/L (ref 20–29)
Calcium: 9.6 mg/dL (ref 8.6–10.2)
Chloride: 101 mmol/L (ref 96–106)
Creatinine, Ser: 1.04 mg/dL (ref 0.76–1.27)
Globulin, Total: 2 g/dL (ref 1.5–4.5)
Glucose: 129 mg/dL — ABNORMAL HIGH (ref 70–99)
Potassium: 4.6 mmol/L (ref 3.5–5.2)
Sodium: 140 mmol/L (ref 134–144)
Total Protein: 6.4 g/dL (ref 6.0–8.5)
eGFR: 78 mL/min/1.73 (ref >59)

## 2024-01-21 NOTE — Progress Notes (Signed)
 Subjective:    Patient ID: Jason Rogers, male    DOB: 04-Aug-1955, 68 y.o.   MRN: [id number]  Chief Complaint  Patient presents with   Follow-up   Hearing Problem    Right side    Pt presents to the office today today for HTN follow up. He was seen on 12/10/23 and we stopped him Norvasc  5 mg and started hydrochlorothiazide  25 mg.  His BP is slightly elevated.   Complaining of decreased hearing in his right ear that has worsen over the last month.  Hypertension This is a chronic problem. The current episode started more than 1 year ago. The problem has been waxing and waning since onset. The problem is uncontrolled. Associated symptoms include malaise/fatigue. Pertinent negatives include no peripheral edema or shortness of breath. Risk factors for coronary artery disease include dyslipidemia and male gender. The current treatment provides moderate improvement.      Review of Systems  Constitutional:  Positive for malaise/fatigue.  Respiratory:  Negative for shortness of breath.   All other systems reviewed and are negative.   Social History   Socioeconomic History   Marital status: Married    Spouse name: Jason Rogers   Number of children: Not on file   Years of education: Not on file   Highest education level: Not on file  Occupational History    Comment: loads steel 3x per week  Tobacco Use   Smoking status: Never   Smokeless tobacco: Never  Vaping Use   Vaping status: Never Used  Substance and Sexual Activity   Alcohol use: Yes    Comment: rare   Drug use: No   Sexual activity: Not on file  Other Topics Concern   Not on file  Social History Narrative   Not on file   Social Drivers of Health   Financial Resource Strain: Low Risk  (06/07/2023)   Overall Financial Resource Strain (CARDIA)    Difficulty of Paying Living Expenses: Not hard at all  Food Insecurity: Low Risk  (09/04/2023)   Received from [facility]   Hunger Vital Sign    Within the past 12 months,  you worried that your food would run out before you got money to buy more: Never true    Within the past 12 months, the food you bought just didn't last and you didn't have money to get more. : Never true  Transportation Needs: No Transportation Needs (09/04/2023)   Received from [facility]    In the past 12 months, has lack of reliable transportation kept you from medical appointments, meetings, work or from getting things needed for daily living? : No  Physical Activity: Sufficiently Active (06/07/2023)   Exercise Vital Sign    Days of Exercise per Week: 7 days    Minutes of Exercise per Session: 30 min  Stress: No Stress Concern Present (06/07/2023)   Jason Rogers of Occupational Health - Occupational Stress Questionnaire    Feeling of Stress : Not at all  Social Connections: Moderately Integrated (06/07/2023)   Social Connection and Isolation Panel    Frequency of Communication with Friends and Family: More than three times a week    Frequency of Social Gatherings with Friends and Family: More than three times a week    Attends Religious Services: More than 4 times per year    Active Member of [employer] or Organizations: No    Attends Banker Meetings: Never    Marital Status: Married  Family History  Problem Relation Age of Onset   Diabetes Mother    Heart disease Father         Objective:   Physical Exam Vitals reviewed.  Constitutional:      General: He is not in acute distress.    Appearance: He is well-developed.  HENT:     Head: Normocephalic.     Right Ear: Tympanic membrane normal.     Left Ear: Tympanic membrane normal.  Eyes:     General:        Right eye: No discharge.        Left eye: No discharge.     Pupils: Pupils are equal, round, and reactive to light.  Neck:     Thyroid : No thyromegaly.  Cardiovascular:     Rate and Rhythm: Normal rate and regular rhythm.     Heart sounds: Normal heart sounds. No murmur  heard. Pulmonary:     Effort: Pulmonary effort is normal. No respiratory distress.     Breath sounds: Normal breath sounds. No wheezing.  Abdominal:     General: Bowel sounds are normal. There is no distension.     Palpations: Abdomen is soft.     Tenderness: There is no abdominal tenderness.  Musculoskeletal:        General: No tenderness. Normal range of motion.     Cervical back: Normal range of motion and neck supple.  Skin:    General: Skin is warm and dry.     Findings: No erythema or rash.  Neurological:     Mental Status: He is alert and oriented to person, place, and time.     Cranial Nerves: No cranial nerve deficit.     Deep Tendon Reflexes: Reflexes are normal and symmetric.  Psychiatric:        Behavior: Behavior normal.        Thought Content: Thought content normal.        Judgment: Judgment normal.       BP (!) 145/83   Pulse 71   Temp (!) 97.3 F (36.3 C) (Temporal)   Ht 6' (1.829 m)   Wt 224 lb (101.6 kg)   SpO2 97%   BMI 30.38 kg/m      Assessment & Plan:  Jason Rogers comes in today with chief complaint of Follow-up and Hearing Problem (Right side )   Diagnosis and orders addressed:  1. Encounter for immunization (Primary) - Flu vaccine HIGH DOSE PF(Fluzone Trivalent)  2. Hypertension associated with diabetes (HCC) Pt will monitor at home If >140/90 will need to add medications  -Daily blood pressure log given with instructions on how to fill out and told to bring to next visit -Dash diet information given -Exercise encouraged - Stress Management  -Continue current meds - CMP14+EGFR  3. Hearing loss of right ear, unspecified hearing loss type Does not want referral at this time  - CMP14+EGFR   Labs pending Continue current medications  Keep follow up with specialists  Will monitor BP at home, if >140/90 will need to adjust medications Low salt diet  Follow up in 4 months     Jason Learn, FNP

## 2024-01-21 NOTE — Patient Instructions (Signed)
 Hypertension, Adult High blood pressure (hypertension) is when the force of blood pumping through the arteries is too strong. The arteries are the blood vessels that carry blood from the heart throughout the body. Hypertension forces the heart to work harder to pump blood and may cause arteries to become narrow or stiff. Untreated or uncontrolled hypertension can lead to a heart attack, heart failure, a stroke, kidney disease, and other problems. A blood pressure reading consists of a higher number over a lower number. Ideally, your blood pressure should be below 120/80. The first ("top") number is called the systolic pressure. It is a measure of the pressure in your arteries as your heart beats. The second ("bottom") number is called the diastolic pressure. It is a measure of the pressure in your arteries as the heart relaxes. What are the causes? The exact cause of this condition is not known. There are some conditions that result in high blood pressure. What increases the risk? Certain factors may make you more likely to develop high blood pressure. Some of these risk factors are under your control, including: Smoking. Not getting enough exercise or physical activity. Being overweight. Having too much fat, sugar, calories, or salt (sodium) in your diet. Drinking too much alcohol. Other risk factors include: Having a personal history of heart disease, diabetes, high cholesterol, or kidney disease. Stress. Having a family history of high blood pressure and high cholesterol. Having obstructive sleep apnea. Age. The risk increases with age. What are the signs or symptoms? High blood pressure may not cause symptoms. Very high blood pressure (hypertensive crisis) may cause: Headache. Fast or irregular heartbeats (palpitations). Shortness of breath. Nosebleed. Nausea and vomiting. Vision changes. Severe chest pain, dizziness, and seizures. How is this diagnosed? This condition is diagnosed by  measuring your blood pressure while you are seated, with your arm resting on a flat surface, your legs uncrossed, and your feet flat on the floor. The cuff of the blood pressure monitor will be placed directly against the skin of your upper arm at the level of your heart. Blood pressure should be measured at least twice using the same arm. Certain conditions can cause a difference in blood pressure between your right and left arms. If you have a high blood pressure reading during one visit or you have normal blood pressure with other risk factors, you may be asked to: Return on a different day to have your blood pressure checked again. Monitor your blood pressure at home for 1 week or longer. If you are diagnosed with hypertension, you may have other blood or imaging tests to help your health care provider understand your overall risk for other conditions. How is this treated? This condition is treated by making healthy lifestyle changes, such as eating healthy foods, exercising more, and reducing your alcohol intake. You may be referred for counseling on a healthy diet and physical activity. Your health care provider may prescribe medicine if lifestyle changes are not enough to get your blood pressure under control and if: Your systolic blood pressure is above 130. Your diastolic blood pressure is above 80. Your personal target blood pressure may vary depending on your medical conditions, your age, and other factors. Follow these instructions at home: Eating and drinking  Eat a diet that is high in fiber and potassium, and low in sodium, added sugar, and fat. An example of this eating plan is called the DASH diet. DASH stands for Dietary Approaches to Stop Hypertension. To eat this way: Eat  plenty of fresh fruits and vegetables. Try to fill one half of your plate at each meal with fruits and vegetables. Eat whole grains, such as whole-wheat pasta, brown rice, or whole-grain bread. Fill about one  fourth of your plate with whole grains. Eat or drink low-fat dairy products, such as skim milk or low-fat yogurt. Avoid fatty cuts of meat, processed or cured meats, and poultry with skin. Fill about one fourth of your plate with lean proteins, such as fish, chicken without skin, beans, eggs, or tofu. Avoid pre-made and processed foods. These tend to be higher in sodium, added sugar, and fat. Reduce your daily sodium intake. Many people with hypertension should eat less than 1,500 mg of sodium a day. Do not drink alcohol if: Your health care provider tells you not to drink. You are pregnant, may be pregnant, or are planning to become pregnant. If you drink alcohol: Limit how much you have to: 0-1 drink a day for women. 0-2 drinks a day for men. Know how much alcohol is in your drink. In the [place] one drink equals one 12 oz bottle of beer (355 mL), one 5 oz glass of wine (148 mL), or one 1 oz glass of hard liquor (44 mL). Lifestyle  Work with your health care provider to maintain a healthy body weight or to lose weight. Ask what an ideal weight is for you. Get at least 30 minutes of exercise that causes your heart to beat faster (aerobic exercise) most days of the week. Activities may include walking, swimming, or biking. Include exercise to strengthen your muscles (resistance exercise), such as Pilates or lifting weights, as part of your weekly exercise routine. Try to do these types of exercises for 30 minutes at least 3 days a week. Do not use any products that contain nicotine or tobacco. These products include cigarettes, chewing tobacco, and vaping devices, such as e-cigarettes. If you need help quitting, ask your health care provider. Monitor your blood pressure at home as told by your health care provider. Keep all follow-up visits. This is important. Medicines Take over-the-counter and prescription medicines only as told by your health care provider. Follow directions carefully. Blood  pressure medicines must be taken as prescribed. Do not skip doses of blood pressure medicine. Doing this puts you at risk for problems and can make the medicine less effective. Ask your health care provider about side effects or reactions to medicines that you should watch for. Contact a health care provider if you: Think you are having a reaction to a medicine you are taking. Have headaches that keep coming back (recurring). Feel dizzy. Have swelling in your ankles. Have trouble with your vision. Get help right away if you: Develop a severe headache or confusion. Have unusual weakness or numbness. Feel faint. Have severe pain in your chest or abdomen. Vomit repeatedly. Have trouble breathing. These symptoms may be an emergency. Get help right away. Call 911. Do not wait to see if the symptoms will go away. Do not drive yourself to the hospital. Summary Hypertension is when the force of blood pumping through your arteries is too strong. If this condition is not controlled, it may put you at risk for serious complications. Your personal target blood pressure may vary depending on your medical conditions, your age, and other factors. For most people, a normal blood pressure is less than 120/80. Hypertension is treated with lifestyle changes, medicines, or a combination of both. Lifestyle changes include losing weight, eating a healthy,  low-sodium diet, exercising more, and limiting alcohol. This information is not intended to replace advice given to you by your health care provider. Make sure you discuss any questions you have with your health care provider. Document Revised: 12/20/2020 Document Reviewed: 12/20/2020 Elsevier Patient Education  2024 [employer].

## 2024-01-26 ENCOUNTER — Other Ambulatory Visit: Payer: Self-pay | Admitting: Family

## 2024-01-26 DIAGNOSIS — K219 Gastro-esophageal reflux disease without esophagitis: Secondary | ICD-10-CM

## 2024-01-27 ENCOUNTER — Ambulatory Visit: Payer: Self-pay | Admitting: Family

## 2024-02-06 DIAGNOSIS — E119 Type 2 diabetes mellitus without complications: Secondary | ICD-10-CM | POA: Diagnosis not present

## 2024-02-06 DIAGNOSIS — H524 Presbyopia: Secondary | ICD-10-CM | POA: Diagnosis not present

## 2024-02-06 LAB — OPHTHALMOLOGY REPORT-SCANNED

## 2024-02-09 ENCOUNTER — Other Ambulatory Visit: Payer: Self-pay | Admitting: Family

## 2024-02-09 DIAGNOSIS — M4726 Other spondylosis with radiculopathy, lumbar region: Secondary | ICD-10-CM

## 2024-02-09 DIAGNOSIS — E1169 Type 2 diabetes mellitus with other specified complication: Secondary | ICD-10-CM

## 2024-02-13 NOTE — Telephone Encounter (Signed)
 PAP: Patient assistance application for Farxiga  through [employer] (AZ[employer]Me) has been mailed to pt's home address on file.

## 2024-02-14 NOTE — Telephone Encounter (Signed)
 New refills will need to be sent with re-enrollment.

## 2024-02-23 ENCOUNTER — Other Ambulatory Visit: Payer: Self-pay | Admitting: Family

## 2024-02-23 DIAGNOSIS — I152 Hypertension secondary to endocrine disorders: Secondary | ICD-10-CM

## 2024-03-03 NOTE — Telephone Encounter (Signed)
 PAP: re-enrollment application for Farxiga  has been submitted to [employer] (AZ[employer]), via fax.  Please send new 90 day RX to [facility] pharmacy for patients re-enrollment. Thanks!

## 2024-03-05 ENCOUNTER — Telehealth: Payer: Self-pay | Admitting: Pharmacist

## 2024-03-05 DIAGNOSIS — E1169 Type 2 diabetes mellitus with other specified complication: Secondary | ICD-10-CM

## 2024-03-05 DIAGNOSIS — E119 Type 2 diabetes mellitus without complications: Secondary | ICD-10-CM

## 2024-03-05 MED ORDER — DAPAGLIFLOZIN PROPANEDIOL 10 MG PO TABS: 10.0000 mg | ORAL_TABLET | Freq: Every day | ORAL | 3 refills | Status: DC

## 2024-03-05 NOTE — Telephone Encounter (Signed)
   Patient enrolled in the AZ&me patient assistance program for [place].  Updated RX escribed to [facility] mail order (pharmacy for AZ&me patient assistance).  Patient is stable on current regimen.    Kieth Brightly, PharmD, BCACP, CPP Clinical Pharmacist, [facility]

## 2024-03-13 ENCOUNTER — Telehealth: Payer: Self-pay | Admitting: Physical Medicine and Rehabilitation

## 2024-03-13 NOTE — Telephone Encounter (Signed)
 Patient's wife called. He would like an injection.

## 2024-03-13 NOTE — Telephone Encounter (Signed)
 PAP: Patient assistance application for Farxiga  has been approved by PAP Companies: [employer] from 03/04/24 to 02/25/25.   Medication should be delivered to PAP Delivery: Home.   For further shipping updates, please contact [employer] ([employer]) at [phone].

## 2024-03-16 ENCOUNTER — Telehealth: Payer: Self-pay | Admitting: Pharmacist

## 2024-03-16 ENCOUNTER — Other Ambulatory Visit: Payer: Self-pay | Admitting: Physical Medicine and Rehabilitation

## 2024-03-16 DIAGNOSIS — E119 Type 2 diabetes mellitus without complications: Secondary | ICD-10-CM

## 2024-03-16 DIAGNOSIS — M5416 Radiculopathy, lumbar region: Secondary | ICD-10-CM

## 2024-03-16 MED ORDER — DAPAGLIFLOZIN PROPANEDIOL 10 MG PO TABS: 10.0000 mg | ORAL_TABLET | Freq: Every day | ORAL | 3 refills | Status: AC

## 2024-03-16 NOTE — Telephone Encounter (Signed)
" ° °  Patient enrolled in the AZ&me patient assistance program for Farxiga .  [employer]me did not receive RX from 2 weeks ago.  Updated RX escribed to [facility] mail order (pharmacy for AZ&me patient assistance).  Patient is stable on current regimen.    Braxdon Gappa Dattero Becket Wecker, PharmD, BCACP, CPP Clinical Pharmacist, [facility]  "

## 2024-03-19 ENCOUNTER — Other Ambulatory Visit: Payer: Self-pay | Admitting: Family

## 2024-03-30 ENCOUNTER — Encounter: Admitting: Physical Medicine and Rehabilitation

## 2024-04-21 ENCOUNTER — Encounter: Admitting: Physical Medicine and Rehabilitation

## 2024-06-12 ENCOUNTER — Ambulatory Visit: Payer: Self-pay

## 2024-07-23 ENCOUNTER — Ambulatory Visit: Admitting: Family
